# Patient Record
Sex: Male | Born: 1955 | Race: White | Hispanic: No | Marital: Single | Smoking: Current every day smoker
Health system: Southern US, Community
[De-identification: ages and names within clinical notes are randomized; demographics above are authoritative.]

## PROBLEM LIST (undated history)

## (undated) DIAGNOSIS — N529 Male erectile dysfunction, unspecified: Secondary | ICD-10-CM

## (undated) DIAGNOSIS — R911 Solitary pulmonary nodule: Secondary | ICD-10-CM

## (undated) DIAGNOSIS — N393 Stress incontinence (female) (male): Secondary | ICD-10-CM

## (undated) DIAGNOSIS — E349 Endocrine disorder, unspecified: Secondary | ICD-10-CM

## (undated) DIAGNOSIS — N401 Enlarged prostate with lower urinary tract symptoms: Secondary | ICD-10-CM

## (undated) DIAGNOSIS — C61 Malignant neoplasm of prostate: Secondary | ICD-10-CM

## (undated) DIAGNOSIS — Z89519 Acquired absence of unspecified leg below knee: Secondary | ICD-10-CM

## (undated) DIAGNOSIS — Z8639 Personal history of other endocrine, nutritional and metabolic disease: Secondary | ICD-10-CM

## (undated) DIAGNOSIS — E119 Type 2 diabetes mellitus without complications: Secondary | ICD-10-CM

## (undated) DIAGNOSIS — N138 Other obstructive and reflux uropathy: Secondary | ICD-10-CM

## (undated) DIAGNOSIS — R972 Elevated prostate specific antigen [PSA]: Secondary | ICD-10-CM

## (undated) DIAGNOSIS — Z8619 Personal history of other infectious and parasitic diseases: Secondary | ICD-10-CM

## (undated) HISTORY — DX: Malignant neoplasm of prostate: C61

## (undated) HISTORY — PX: HIP FRACTURE SURGERY: SHX118

## (undated) HISTORY — DX: Personal history of other endocrine, nutritional and metabolic disease: Z86.39

## (undated) HISTORY — DX: Male erectile dysfunction, unspecified: N52.9

## (undated) HISTORY — DX: Solitary pulmonary nodule: R91.1

## (undated) HISTORY — DX: Elevated prostate specific antigen (PSA): R97.20

## (undated) HISTORY — DX: Acquired absence of unspecified leg below knee: Z89.519

## (undated) HISTORY — PX: ROBOT ASSISTED LAPAROSCOPIC RADICAL PROSTATECTOMY: SHX5141

## (undated) HISTORY — DX: Other obstructive and reflux uropathy: N40.1

## (undated) HISTORY — DX: Type 2 diabetes mellitus without complications: E11.9

## (undated) HISTORY — DX: Endocrine disorder, unspecified: E34.9

## (undated) HISTORY — DX: Other obstructive and reflux uropathy: N13.8

## (undated) HISTORY — DX: Personal history of other infectious and parasitic diseases: Z86.19

## (undated) HISTORY — DX: Stress incontinence (female) (male): N39.3

## (undated) HISTORY — PX: LEG AMPUTATION BELOW KNEE: SHX694

---

## 2009-03-06 ENCOUNTER — Ambulatory Visit: Payer: Self-pay | Admitting: Internal Medicine

## 2009-03-08 ENCOUNTER — Ambulatory Visit: Payer: Self-pay | Admitting: Internal Medicine

## 2009-03-16 ENCOUNTER — Ambulatory Visit: Payer: Self-pay | Admitting: Internal Medicine

## 2009-03-23 ENCOUNTER — Ambulatory Visit: Payer: Self-pay | Admitting: Internal Medicine

## 2009-03-30 ENCOUNTER — Ambulatory Visit: Payer: Self-pay | Admitting: Internal Medicine

## 2009-04-06 ENCOUNTER — Ambulatory Visit: Payer: Self-pay | Admitting: Internal Medicine

## 2009-04-07 ENCOUNTER — Ambulatory Visit: Payer: Self-pay | Admitting: Internal Medicine

## 2009-04-13 ENCOUNTER — Ambulatory Visit: Payer: Self-pay | Admitting: Internal Medicine

## 2009-04-20 ENCOUNTER — Ambulatory Visit: Payer: Self-pay | Admitting: Internal Medicine

## 2009-04-27 ENCOUNTER — Ambulatory Visit: Payer: Self-pay | Admitting: Internal Medicine

## 2009-05-04 ENCOUNTER — Ambulatory Visit: Payer: Self-pay | Admitting: Internal Medicine

## 2009-05-11 ENCOUNTER — Ambulatory Visit: Payer: Self-pay | Admitting: Internal Medicine

## 2009-05-18 ENCOUNTER — Ambulatory Visit: Payer: Self-pay | Admitting: Internal Medicine

## 2009-06-01 ENCOUNTER — Ambulatory Visit: Payer: Self-pay | Admitting: Internal Medicine

## 2009-06-08 ENCOUNTER — Ambulatory Visit: Payer: Self-pay | Admitting: Internal Medicine

## 2009-06-15 ENCOUNTER — Ambulatory Visit: Payer: Self-pay | Admitting: Podiatrist

## 2009-07-07 ENCOUNTER — Ambulatory Visit: Payer: Self-pay | Admitting: Podiatrist

## 2010-10-03 ENCOUNTER — Encounter (HOSPITAL_COMMUNITY)
Admission: RE | Admit: 2010-10-03 | Discharge: 2010-10-03 | Disposition: A | Discharge status: Home / Self Care | Payer: BC Managed Care – PPO | Source: Ambulatory Visit | Attending: Orthopedic Surgery | Admitting: Orthopedic Surgery

## 2010-10-03 ENCOUNTER — Ambulatory Visit (HOSPITAL_COMMUNITY)
Admission: RE | Admit: 2010-10-03 | Discharge: 2010-10-03 | Disposition: A | Discharge status: Home / Self Care | Payer: BC Managed Care – PPO | Source: Ambulatory Visit | Attending: Orthopedic Surgery | Admitting: Orthopedic Surgery

## 2010-10-03 ENCOUNTER — Other Ambulatory Visit (HOSPITAL_COMMUNITY): Payer: Self-pay | Admitting: Orthopedic Surgery

## 2010-10-03 DIAGNOSIS — Z01818 Encounter for other preprocedural examination: Secondary | ICD-10-CM | POA: Insufficient documentation

## 2010-10-03 DIAGNOSIS — M869 Osteomyelitis, unspecified: Secondary | ICD-10-CM

## 2010-10-03 DIAGNOSIS — Z01812 Encounter for preprocedural laboratory examination: Secondary | ICD-10-CM | POA: Insufficient documentation

## 2010-10-03 LAB — PROTIME-INR
INR: 0.93 (ref 0.00–1.49)
Prothrombin Time: 12.7 seconds (ref 11.6–15.2)

## 2010-10-03 LAB — CBC
HCT: 42.2 % (ref 39.0–52.0)
Hemoglobin: 14.7 g/dL (ref 13.0–17.0)
RBC: 4.99 MIL/uL (ref 4.22–5.81)
WBC: 10.7 10*3/uL — ABNORMAL HIGH (ref 4.0–10.5)

## 2010-10-03 LAB — BASIC METABOLIC PANEL
Chloride: 97 mEq/L (ref 96–112)
GFR calc Af Amer: >60 mL/min (ref >60)
GFR calc non Af Amer: >60 mL/min (ref >60)
Potassium: 4.8 mEq/L (ref 3.5–5.1)
Sodium: 132 mEq/L — ABNORMAL LOW (ref 135–145)

## 2010-10-03 LAB — SURGICAL PCR SCREEN
MRSA, PCR: NEGATIVE
Staphylococcus aureus: NEGATIVE

## 2010-10-09 ENCOUNTER — Other Ambulatory Visit: Payer: Self-pay | Admitting: Orthopedic Surgery

## 2010-10-09 DIAGNOSIS — E1169 Type 2 diabetes mellitus with other specified complication: Principal | ICD-10-CM | POA: Diagnosis present

## 2010-10-09 DIAGNOSIS — I96 Gangrene, not elsewhere classified: Secondary | ICD-10-CM | POA: Diagnosis present

## 2010-10-09 DIAGNOSIS — M9080 Osteopathy in diseases classified elsewhere, unspecified site: Secondary | ICD-10-CM | POA: Diagnosis present

## 2010-10-09 DIAGNOSIS — E1159 Type 2 diabetes mellitus with other circulatory complications: Secondary | ICD-10-CM | POA: Diagnosis present

## 2010-10-09 DIAGNOSIS — M869 Osteomyelitis, unspecified: Secondary | ICD-10-CM | POA: Diagnosis present

## 2010-10-09 LAB — GLUCOSE, CAPILLARY
Glucose-Capillary: 214 mg/dL — ABNORMAL HIGH (ref 70–99)
Glucose-Capillary: 208 mg/dL — ABNORMAL HIGH (ref 70–99)
Glucose-Capillary: 153 mg/dL — ABNORMAL HIGH (ref 70–99)

## 2010-10-10 DIAGNOSIS — S88119A Complete traumatic amputation at level between knee and ankle, unspecified lower leg, initial encounter: Secondary | ICD-10-CM

## 2010-10-10 DIAGNOSIS — L98499 Non-pressure chronic ulcer of skin of other sites with unspecified severity: Secondary | ICD-10-CM

## 2010-10-10 DIAGNOSIS — I739 Peripheral vascular disease, unspecified: Secondary | ICD-10-CM

## 2010-10-10 LAB — CBC
HCT: 39.7 % (ref 39.0–52.0)
MCHC: 34.3 g/dL (ref 30.0–36.0)
MCV: 84.8 fL (ref 78.0–100.0)
RDW: 13.4 % (ref 11.5–15.5)

## 2010-10-10 LAB — BASIC METABOLIC PANEL
BUN: 8 mg/dL (ref 6–23)
Calcium: 9.2 mg/dL (ref 8.4–10.5)
Creatinine, Ser: 0.7 mg/dL (ref 0.4–1.5)
GFR calc non Af Amer: >60 mL/min (ref >60)
Glucose, Bld: 139 mg/dL — ABNORMAL HIGH (ref 70–99)

## 2010-10-10 LAB — GLUCOSE, CAPILLARY: Glucose-Capillary: 217 mg/dL — ABNORMAL HIGH (ref 70–99)

## 2010-10-10 NOTE — Op Note (Signed)
[name], [name]                 ACCOUNT NO.:  [id number]  MEDICAL RECORD NO.:  [id number]           PATIENT TYPE:  I  LOCATION:  5011                         FACILITY:  [facility]  PHYSICIAN:  Eulas Post, MD    DATE OF BIRTH:  [birth year]  DATE OF PROCEDURE:  10/09/2010 DATE OF DISCHARGE:                              OPERATIVE REPORT   PREOPERATIVE DIAGNOSIS:  Left foot diabetic osteomyelitis with gangrene.  POSTOPERATIVE DIAGNOSIS:  Left foot diabetic osteomyelitis with gangrene.  OPERATIVE PROCEDURE:  Left leg below-knee amputation.  ATTENDING SURGEON:  Eulas Post, MD  FIRST ASSISTANT:  Janace Litten, orthopedic PA-C  PREOPERATIVE INDICATIONS:  Allen Chavez is a 55 year old gentleman who had osteomyelitis of the foot.  He had had a transmetatarsal amputation and had ongoing chronic draining sinus wound.  He was frustrated with his failure to heal and wished to have a below-knee amputation.  We discussed the surgical options including amputation more proximally in the foot versus below-knee amputation and he was tired of dealing with multiple small operations and just wanted one that would reliably provide him with a leg that he could stand on with the prosthesis.  Therefore; the risks, benefits and alternatives were discussed before the procedure with the patient including, but not limited to the risks of recurrent infection, bleeding, nerve injury, neuroma formation, failure to regain ambulatory function, cardiopulmonary complications, blood clots, among others and he was willing to proceed.  OPERATIVE PROCEDURE:  The patient brought to the operating room, placed in supine position.  Vancomycin was given due to his chronic infection, although I did not have a diagnosis of MRSA, but he is at risk given his health concerns and risk factors including his diabetes.  Therefore, after general anesthesia was administered, the left lower extremity was prepped and  draped in the usual sterile fashion.  The leg was elevated and a tourniquet was used, but the leg was exsanguinated beginning above the level of the infection.  Tourniquet was then inflated.  Total tourniquet time was 60 minutes.  I then marked out my bony resection which was approximately 13-14 cm below the joint line.  I also made a fishmouth-type skin flap with it being actually more two-thirds length in the back and one-third in the front.  I then incised the skin and identified the saphenous nerve and vein.  The vein was tied off and the nerve was resected proximally as high as possible with a sharp knife.  I then found the superficial and the deep peroneal nerve and resected them proximal as high as possible.  The peroneal arteries were also ligated.  I then resected my tibia using a saw and beveled it at 45 degrees anteriorly.  I also resected the fibula prior to resecting the tibia and I did this about 1.5 cm proximal to the tibial cut.  I then developed a flap for the soleus and the gastroc and identified the tibial artery and posterior tibial nerve.  These were then ligated and I cut the tibial nerve as proximal as possible with a sharp blade. Once all of this  had been achieved, I trimmed my gastroc flap up to the appropriate size and then released the tourniquet.  Excellent hemostasis was obtained.  I then repaired the fascia of the soleus flap to the periosteum anteriorly over the tibia using #1 Vicryl.  I then repaired the subcutaneous tissue with 2-0 Vicryl followed by staples for the skin.  There was no bleeding of any significance, and therefore I did not place the drain.  Sterile dressing followed by a splint was applied. The patient was awakened and returned to the PACU in stable and satisfactory condition.  There were no complications and he tolerated the procedure well.  Janace Litten, orthopedic PA was present and scrubbed throughout the case and critical for  retraction as well as exposure and assistance with closure.     Eulas Post, MD     JPL/MEDQ  D:  10/09/2010  T:  10/10/2010  Job:  [id number]  Electronically Signed by Teryl Lucy MD on 10/10/2010 09:48:01 AM

## 2010-10-11 LAB — GLUCOSE, CAPILLARY

## 2010-10-15 NOTE — Discharge Summary (Signed)
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  PRIMARY PROCEDURE:  Left below-knee amputation.  DISCHARGE MEDICATIONS:  Percocet as well as Valium and Phenergan as needed.  HOSPITAL COURSE:  Mr. Llewyn Buonocore is a 55 year old gentleman with diabetes who has had multiple partial foot amputations and continued to have draining osteomyelitis.  He elected to undergo below-knee amputation.  He tolerated the procedure well.  Postoperatively, he did not have any complications.  He was given perioperative vancomycin in order to reduce the risk for MRSA infection.  He did screen MRSA negative; however, given his history of osteomyelitis and multiple surgeries, I was concerned that he had a higher than normal risk for MRSA and therefore used vancomycin.  His dressings were clean, dry, and intact throughout his hospital stay.  He benefited maximally from this hospital stay and is currently being discharged home with follow up with me in 2 weeks.  His pain was initially controlled on IV PCA and then subsequently switched to p.o. analgesia.  He was also given Lovenox and sequential compression devices for DVT prophylaxis.     Eulas Post, MD     JPL/MEDQ  D:  10/10/2010  T:  10/10/2010  Job:  [id number]  Electronically Signed by Teryl Lucy MD on 10/15/2010 07:03:42 PM

## 2011-02-06 ENCOUNTER — Encounter: Payer: Self-pay | Admitting: Orthopedic Surgery

## 2011-02-27 ENCOUNTER — Encounter: Payer: Self-pay | Admitting: Orthopedic Surgery

## 2013-11-22 ENCOUNTER — Emergency Department: Payer: Self-pay | Admitting: Emergency Medicine

## 2013-11-22 LAB — URINALYSIS, COMPLETE
BACTERIA: NONE SEEN
BILIRUBIN, UR: NEGATIVE
Glucose,UR: >=500 mg/dL (ref 0–75)
Ketone: NEGATIVE
LEUKOCYTE ESTERASE: NEGATIVE
NITRITE: NEGATIVE
PH: 5 (ref 4.5–8.0)
SQUAMOUS EPITHELIAL: NONE SEEN
Specific Gravity: 1.02 (ref 1.003–1.030)
WBC UR: 1 /HPF (ref 0–5)

## 2013-11-22 LAB — CBC
HCT: 47.1 % (ref 40.0–52.0)
HGB: 15.9 g/dL (ref 13.0–18.0)
MCH: 28.3 pg (ref 26.0–34.0)
MCHC: 33.7 g/dL (ref 32.0–36.0)
MCV: 84 fL (ref 80–100)
Platelet: 222 10*3/uL (ref 150–440)
RBC: 5.61 10*6/uL (ref 4.40–5.90)
RDW: 13.6 % (ref 11.5–14.5)
WBC: 10.7 10*3/uL — ABNORMAL HIGH (ref 3.8–10.6)

## 2013-11-22 LAB — COMPREHENSIVE METABOLIC PANEL
AST: 18 U/L (ref 15–37)
Albumin: 3.8 g/dL (ref 3.4–5.0)
Alkaline Phosphatase: 151 U/L — ABNORMAL HIGH
Anion Gap: 7 (ref 7–16)
BUN: 24 mg/dL — ABNORMAL HIGH (ref 7–18)
Bilirubin,Total: 0.6 mg/dL (ref 0.2–1.0)
CALCIUM: 9.9 mg/dL (ref 8.5–10.1)
CHLORIDE: 101 mmol/L (ref 98–107)
Co2: 26 mmol/L (ref 21–32)
Creatinine: 1.14 mg/dL (ref 0.60–1.30)
EGFR (African American): >60
Glucose: 229 mg/dL — ABNORMAL HIGH (ref 65–99)
Osmolality: 280 (ref 275–301)
POTASSIUM: 4.5 mmol/L (ref 3.5–5.1)
SGPT (ALT): 23 U/L (ref 12–78)
Sodium: 134 mmol/L — ABNORMAL LOW (ref 136–145)
TOTAL PROTEIN: 8 g/dL (ref 6.4–8.2)

## 2013-11-22 LAB — LIPASE, BLOOD: LIPASE: 597 U/L — AB (ref 73–393)

## 2014-08-01 DIAGNOSIS — I10 Essential (primary) hypertension: Secondary | ICD-10-CM | POA: Insufficient documentation

## 2014-08-01 DIAGNOSIS — E119 Type 2 diabetes mellitus without complications: Secondary | ICD-10-CM | POA: Insufficient documentation

## 2014-08-01 DIAGNOSIS — C61 Malignant neoplasm of prostate: Secondary | ICD-10-CM | POA: Insufficient documentation

## 2014-09-26 ENCOUNTER — Ambulatory Visit: Payer: Self-pay | Admitting: Urology

## 2014-11-03 ENCOUNTER — Encounter
Admit: 2014-11-03 | Disposition: A | Discharge status: Home / Self Care | Payer: Self-pay | Attending: Urology | Admitting: Urology

## 2014-11-07 ENCOUNTER — Ambulatory Visit
Admit: 2014-11-07 | Disposition: A | Discharge status: Home / Self Care | Payer: Self-pay | Attending: Urology | Admitting: Urology

## 2014-11-07 LAB — BASIC METABOLIC PANEL
Anion Gap: 8 (ref 7–16)
BUN: 21 mg/dL — ABNORMAL HIGH
CALCIUM: 9.3 mg/dL
CHLORIDE: 103 mmol/L
CO2: 27 mmol/L
CREATININE: 1.04 mg/dL
GLUCOSE: 335 mg/dL — AB
POTASSIUM: 4.4 mmol/L
SODIUM: 138 mmol/L

## 2014-11-07 LAB — CBC
HCT: 41.6 % (ref 40.0–52.0)
HGB: 14 g/dL (ref 13.0–18.0)
MCH: 28.8 pg (ref 26.0–34.0)
MCHC: 33.6 g/dL (ref 32.0–36.0)
MCV: 86 fL (ref 80–100)
PLATELETS: 186 10*3/uL (ref 150–440)
RBC: 4.85 10*6/uL (ref 4.40–5.90)
RDW: 13.8 % (ref 11.5–14.5)
WBC: 7.7 10*3/uL (ref 3.8–10.6)

## 2014-11-07 LAB — APTT: ACTIVATED PTT: 24.3 s (ref 23.6–35.9)

## 2014-11-07 LAB — HEMOGLOBIN A1C: HEMOGLOBIN A1C: 10.1 % — AB

## 2014-11-07 LAB — PROTIME-INR
INR: 0.9
Prothrombin Time: 11.9 secs

## 2014-11-08 ENCOUNTER — Ambulatory Visit
Admit: 2014-11-08 | Disposition: A | Discharge status: Home / Self Care | Payer: Self-pay | Attending: Urology | Admitting: Urology

## 2014-11-14 ENCOUNTER — Inpatient Hospital Stay
Admit: 2014-11-14 | Disposition: A | Discharge status: Home / Self Care | Payer: Self-pay | Attending: Urology | Admitting: Urology

## 2014-11-15 LAB — BASIC METABOLIC PANEL
ANION GAP: 1 — AB (ref 7–16)
BUN: 26 mg/dL — ABNORMAL HIGH
CALCIUM: 8 mg/dL — AB
Chloride: 108 mmol/L
Co2: 26 mmol/L
Creatinine: 0.91 mg/dL
EGFR (African American): >60
GLUCOSE: 167 mg/dL — AB
Potassium: 4.1 mmol/L
Sodium: 135 mmol/L

## 2014-11-15 LAB — CBC WITH DIFFERENTIAL/PLATELET
BASOS PCT: 0.6 %
Basophil #: 0.1 10*3/uL (ref 0.0–0.1)
EOS PCT: 0.2 %
Eosinophil #: 0 10*3/uL (ref 0.0–0.7)
HCT: 34.5 % — AB (ref 40.0–52.0)
HGB: 11.5 g/dL — ABNORMAL LOW (ref 13.0–18.0)
LYMPHS ABS: 1.4 10*3/uL (ref 1.0–3.6)
Lymphocyte %: 12 %
MCH: 28.9 pg (ref 26.0–34.0)
MCHC: 33.3 g/dL (ref 32.0–36.0)
MCV: 87 fL (ref 80–100)
MONO ABS: 0.6 x10 3/mm (ref 0.2–1.0)
MONOS PCT: 5.4 %
NEUTROS ABS: 9.8 10*3/uL — AB (ref 1.4–6.5)
Neutrophil %: 81.8 %
Platelet: 173 10*3/uL (ref 150–440)
RBC: 3.98 10*6/uL — AB (ref 4.40–5.90)
RDW: 14 % (ref 11.5–14.5)
WBC: 11.9 10*3/uL — AB (ref 3.8–10.6)

## 2014-11-21 LAB — SURGICAL PATHOLOGY

## 2014-11-27 NOTE — Discharge Summary (Signed)
Dates of Admission and Diagnosis:  Date of Admission 14-Nov-2014   Date of Discharge 15-Nov-2014   Admitting Diagnosis Prostate cancer   Final Diagnosis prostate cancer   Discharge Diagnosis 1 prostate cancer   2 diabetes    Chief Complaint/History of Present Illness See admission H&P   Allergies:  No Known Allergies:   Routine Chem:  19-Apr-16 05:08   Glucose, Serum  167 (65-99 NOTE: New Reference Range  10/04/14)  BUN  26 (6-20 NOTE: New Reference Range  10/04/14)  Creatinine (comp) 0.91 (0.61-1.24 NOTE: New Reference Range  10/04/14)  Sodium, Serum 135 (135-145 NOTE: New Reference Range  10/04/14)  Potassium, Serum 4.1 (3.5-5.1 NOTE: New Reference Range  10/04/14)  Chloride, Serum 108 (101-111 NOTE: New Reference Range  10/04/14)  CO2, Serum 26 (22-32 NOTE: New Reference Range  10/04/14)  Calcium (Total), Serum  8.0 (8.9-10.3 NOTE: New Reference Range  10/04/14)  Anion Gap  1  eGFR (African American) >60  eGFR (Non-African American) >60 (eGFR values <74m/min/1.73 m2 may be an indication of chronic kidney disease (CKD). Calculated eGFR is useful in patients with stable renal function. The eGFR calculation will not be reliable in acutely ill patients when serum creatinine is changing rapidly. It is not useful in patients on dialysis. The eGFR calculation may not be applicable to patients at the low and high extremes of body sizes, pregnant women, and vegetarians.)  Routine Hem:  19-Apr-16 05:08   WBC (CBC)  11.9  RBC (CBC)  3.98  Hemoglobin (CBC)  11.5  Hematocrit (CBC)  34.5  Platelet Count (CBC) 173  MCV 87  MCH 28.9  MCHC 33.3  RDW 14.0  Neutrophil % 81.8  Lymphocyte % 12.0  Monocyte % 5.4  Eosinophil % 0.2  Basophil % 0.6  Neutrophil #  9.8  Lymphocyte # 1.4  Monocyte # 0.6  Eosinophil # 0.0  Basophil # 0.1 (Result(s) reported on 15 Nov 2014 at 05:58AM.)   Pertinent Past History:  Pertinent Past History see admission H&P    Hospital Course:  Hospital Course Patient was admitted on overnight followed uncomplicated robotic prostatectomy.  His diet was advanced and pain was controlled.  On POD1, he was doing well, ambulating, tolerating PO, and pain was well controlled.  All labs and vitals were stable.  He was discharged home with Foley catheter in place x 1 week with Foley catheter teaching.   Condition on Discharge Good   Code Status:  Code Status Full Code   DISCHARGE INSTRUCTIONS HOME MEDS:  Medication Reconciliation: Patient's Home Medications at Discharge:     Medication Instructions  prilosec otc 20 mg oral delayed release tablet  1 tab(s) orally once a day   atorvastatin 40 mg oral tablet  1 tab(s) orally once a day   lisinopril 20 mg oral tablet  1 tab(s) orally once a day   lantus solostar pen 100 units/ml subcutaneous solution  40 unit(s) subcutaneous once a day (in the morning)   acetaminophen-hydrocodone 325 mg-5 mg oral tablet  1 tab(s) orally every 6 hours, As Needed - for Pain   colace sodium 100 mg oral capsule  1 cap(s) orally 2 times a day   oxybutynin 5 mg oral tablet  1 tab(s) orally 3 times a day, As Needed - for Bladder Spasms    PRESCRIPTIONS: PRINTED AND PLACED ON CHART  STOP TAKING THE FOLLOWING MEDICATION(S):    finasteride 5 mg oral tablet: 1 tab(s) orally once a day  Physician's Instructions:  Home  Health? No   Treatments Foley to leg bag   Dressing Care You may shower, no baths.   Home Oxygen? No   Diet Diabetic diet   Dietary Supplements None   Diet Consistency Regular Consistency   Activity Limitations No exertional activity  No heavy lifting  4-6 weeks   Return to Work after follow up visit with MD   Time frame for Follow Up Appointment 1-2 weeks  7 days for Foley removal     Hollice Espy J(Attending Physician): [facility], [address] [place], [place], [place] [address], [place] [phone]  TIME SPENT:  Total Time: 30 minutes or  less   Electronic Signatures: Sherlynn Stalls (MD)  (Signed 19-Apr-16 10:18)  Authored: ADMISSION DATE AND DIAGNOSIS, CHIEF COMPLAINT/HPI, Allergies, PERTINENT LABS, PERTINENT PAST HISTORY, HOSPITAL COURSE, DISCHARGE INSTRUCTIONS HOME MEDS, PATIENT INSTRUCTIONS, Follow Up Physician, TIME SPENT   Last Updated: 19-Apr-16 10:18 by Sherlynn Stalls (MD)

## 2014-11-27 NOTE — Op Note (Signed)
PATIENT NAME:  Allen Chavez, Allen Chavez MR#:  [id number] DATE OF BIRTH:  Dec 08, 1955  DATE OF PROCEDURE:  11/14/2014  PREOPERATIVE DIAGNOSIS: Prostate cancer.   POSTOPERATIVE DIAGNOSIS: Prostate cancer.   PROCEDURE PERFORMED: Robot-assisted laparoscopic radical nephrectomy (non-nerve sparing) with bilateral pelvic lymph node dissection.   ATTENDING SURGEON: [name], MD    ASSISTANT:  Lewis Moccasin, PA-C.    ANESTHESIA: General anesthesia.   ESTIMATED BLOOD LOSS: 200 mL.   DRAINS:  82 French Foley catheter.   SPECIMENS: Periprosthetic fat, prostate with seminal vesicles and vasa, left pelvic lymph nodes, right pelvic lymph nodes.   COMPLICATIONS: None.   INDICATION: This is a 59 year old male who was previously diagnosed with Gleason 3 + 3 prostate cancer on active surveillance, repeat prostate biopsy showed more aggressive disease up to 4 + 4 involving 6 of 6 cores on the left and 3 of 6 cores on the right. He did have staging workup including CT and bone scan, both of which were negative for any obvious metastatic disease or extracapsular disease. He was counseled on his various treatment options and has elected to undergo robot-assisted laparoscopic radical prostatectomy with bilateral pelvic lymph node dissection given his high-grade cancer as well as severe baseline erectile dysfunction in the setting of poorly controlled diabetes. He was counseled to undergo a non-nerve sparing procedure, to which the patient is agreeable.  Risks and benefits of the procedure were explained in detail to the patient, who agreed to proceed as planned.   PROCEDURE: The patient was correctly identified in the preoperative holding area and informed consent was confirmed. He was brought to the operating suite and placed on the table in supine position. At this time universal timeout protocol was performed. All team members were identified. Venodyne boots were placed and he was administered 2 grams of IV Ancef in the  perioperative period. He was then placed under general anesthesia, shaved by the attending surgeon, and placed in the lateral decubitus position with the arms tucked at the side. Care was taken to pad all pressure points. He was then secured to the operating room table using straps, foam, and 2 inch tape in a cruciate fashion across the chest. He was then prepped and draped in the standard surgical fashion. At this point in time a Veress needle was advanced supraumbilically and pneumo-insufflation was established after the Veress needle was aspirated and a saline drop test was performed.  There were low opening pressures to 5 mmHg.  He was then insufflated to 15 mmHg, which was well tolerated. The remainder of the port sites were then mapped out across his lower abdominal wall. He was placed in steep Trendelenburg position. A 12 mm non-bladed trocar was placed through supraumbilical incision without difficulty. The robotic camera was brought in and there were no injuries to bowel or any vasculature injury noted.  Each of the remaining ports were then placed under direct visualization including 2 robotic port sites 8 mm in diameter in the left lower quadrant, a robotic 8 mm port in the right lower quadrant, as well as a 12 mm in the lateral right lower quadrant, a 5 mm port was placed in the right upper quadrant, all under direct visualization. Each of the port sites were instilled with a total of 20 mL of local anesthetic prior to placement.  At this point in time the robot was then brought in and docked. The left sigmoid was fairly adherent to the abdominal wall and there were several adhesions which were  taken down sharply to free up the colon with care taken to avoid injury to the bowel. Once this was performed the bladder was taken down by incising the median umbilical ligaments and creating and plane in the space of Retzius until the pubic bones could clearly be identified. The prostate was then identified and  the fat overlying the prostate was dissected free and sent off as a periprosthetic fat.  The endopelvic fascia was then identified and starting on the right hand side was carefully opened, creating a plane between the pelvic musculature and the prostate. Dissection was carried out towards the dorsal venous complex.  A pudendal artery was identified on the right-hand side and care was taken to preserve this vascular structure. The left endopelvic fascia was then opened in a similar fashion towards the dorsal venous complex.  The complex was skeletonized. The puboprostatic ligaments were taken down using electrocautery. Once this was achieved dorsal venous complex was ligated using a 2-0 Vicryl suture. Next the Foley catheter was manipulated such that the plane between the prostate and the bladder could easily be identified. An incision was then created at this site and carried down until the Foley catheter was identifiable.  The Foley was lifted out of the wound into the abdominal cavity and a Carter-Thomason needle was used just above the pubic symphysis to place a 0 Vicryl suture which was threaded through the eye of this Foley catheter and brought back out to the level of the skin. This was then clamped in place and the penis was accordioned to create some upward tension, anterior retraction on the prostate.  A posterior plane between the bladder neck and base of the prostate was then created. This plane was developed until the seminal vesicle structures were identified posteriorly. The pedicles were left in place. Each of the vas deferens were then identified and ultimately transected and retracted superiorly. The seminal vesicles were then freely dissected. Care was taken to use judicious cautery at the tips of the seminal vesicles even though this was to be a non-nerve sparing procedure. Once all the seminal vesicles and vas deferens were dissected free they were retracted superiorly and Denonvilliers fascia  was incised. A plane was created between the rectum and the posterior of the prostate. Next attention was turned to ligation of the pedicles starting on the left-hand side. The pedicles were taken in a piecewise fashion using Weck clips. The dissection was then carried out posterolaterally towards the apex of the prostate. The same procedure was used on the right side, ligating the pedicle with a series of Weck clips. At this point in time once the dissection was carried out towards the apex the prostate was only attached to this point by the urethra and the apex. The dorsal venous complex was ligated using electrocautery. The previously placed stitch was sufficient for adequate hemostasis at this point.  The urethra was carefully dissected free and care was taken using sharp dissection to cut the urethra, optimizing the urethral length, but insuring no positive margin at the apex. Finally the periurethral fascia posteriorly was incised, freeing up the entire prostate specimen. The prostate was placed into the Endo Catch bag and placed in a dependent area within the abdominal cavity for later retrieval. The pelvis was then irrigated, there was no active bleeding noted. The pelvis was then filled and air was injected through a Malecot rectal catheter which had been placed posteriorly at the beginning of the case. Of note at the beginning  of the case a 41 French Foley catheter was also placed sterilely of the field as well. Attention was then turned to the right pelvic sidewall where a pelvic lymph node dissection was then performed. The fatty nodal tissue overlying the external iliac vein and artery was dissected free from these major vessels and then more distally towards the obturator artery, nerve, and vein. Care was taken to avoid any injury to these structures. Metal clips were used for hemostasis as well as judicious use of the bipolar electrocautery. The nodal tissue was cleared out from the obturator fossa.  Once this was freed up it was passed off the field as right pelvic lymph nodes. The same exact procedure was performed on the left side and the left pelvic lymph nodes were passed off separately from the field. Again there was no active bleeding noted.  Finally attention was then turned to the completion of the anastomosis.  A Rocco stitch using a 3-0 barbed Covidien suture was used to reapproximate Denonvilliers fascia with the periurethral fascia bringing the bladder neck and urethra closer together and tension-free. Next a running 2 armed, 3-0 Covidien barbed suture was used in a running fashion to create a watertight anastomosis.  The transition stitch was performed at the anterior aspect of the urethra near the end to end anastomosis and finally anastomosis was tied down. The stitch was then placed very loosely through some tissue just below the pubic symphysis to create a suspension stitch. The neeles were then passed off the field. The bladder was then filled with 120 mL of sterile water. The bladder filled nicely and there was no leakage noted from the anastomotic site. The final Foley 18 French catheter was placed with 10 mL in the balloon.  At this point in time all robotic instruments were removed. The trocars were then also removed under direct visualization. The midline trocar site was extended laterally on both sides to open the fascia.  The specimen was then brought up through this incision.  The incision was then closed using a running number 1 PDS. All skin wounds were then closed using a 4-0 Monocryl suture in a subcuticular fashion. The patient was cleaned and dried and a dressing of Dermabond was applied. The patient was then taken out of Trendelenburg, repositioned in a supine position, reversed from anesthesia, and taken to PACU in stable condition. Catheter was secured to the patient's left leg prior to moving the patient. All sponge and needle counts were correct at the end of the case. There  were no complications in this case.     ____________________________ Claris Gladden, MD ajb:[id number] D: 11/14/2014 17:22:19 ET T: 11/14/2014 20:49:29 ET JOB#: [id number]  cc: Claris Gladden, MD, <Dictator> Claris Gladden MD ELECTRONICALLY SIGNED 11/17/2014 14:46

## 2014-11-27 NOTE — Op Note (Addendum)
PATIENT NAME:  Allen Chavez, Allen Chavez MR#:  [id number] DATE OF BIRTH:  1956-02-15  DATE OF PROCEDURE:  11/14/2014  PREOPERATIVE DIAGNOSIS: Prostate cancer.   POSTOPERATIVE DIAGNOSIS: Prostate cancer.   PROCEDURE PERFORMED: Robot-assisted laparoscopic radical prostatectomy (non-nerve sparing) with bilateral pelvic lymph node dissection.   ATTENDING SURGEON: [name], MD    ASSISTANT:  Lewis Moccasin, PA-C.    ANESTHESIA: General anesthesia.   ESTIMATED BLOOD LOSS: 200 mL.   DRAINS:  13 French Foley catheter.   SPECIMENS: Periprosthetic fat, prostate with seminal vesicles and vasa left pelvic lymph nodes, right pelvic lymph nodes.   COMPLICATIONS: None.   INDICATION: This is a 59 year old male who was previously diagnosed with Gleason 3 + 3 prostate cancer on active surveillance, repeat prostate biopsy showed more aggressive disease up to 4 + 4 involving 6 of 6 cores on the left and 3 of 6 cores on the right. He did have staging workup including CT and bone scan, both of which were negative for any obvious metastatic disease or extracapsular disease. He was counseled on his various treatment options and has elected to undergo robot-assisted laparoscopic radical prostatectomy with bilateral pelvic lymph node dissection given his high-grade cancer as well as severe baseline erectile dysfunction in the setting of poorly controlled diabetes. He was counseled to undergo a non-nerve sparing procedure, to which the patient is agreeable.  Risks and benefits of the procedure were explained in detail to the patient, who agreed to proceed as planned.   PROCEDURE: The patient was correctly identified in the preoperative holding area and informed consent was confirmed. He was brought to the operating suite and placed on the table in supine position. At this time universal timeout protocol was performed. All team members were identified. Venodyne boots were placed and he was administered 2 grams of IV Ancef in the  perioperative period. He was then placed under general anesthesia, shaved by the attending surgeon, and placed in the lateral decubitus position with the arms tucked at the side. Care was taken to pad all pressure points. He was then secured to the operating room table using straps, foam, and 2 inch tape in a cruciate fashion across the chest. He was then prepped and draped in the standard surgical fashion. At this point in time a Veress needle was advanced supraumbilically and pneumo-insufflation was established after the Veress needle was aspirated and a saline drop test was performed.  There were low opening pressures to 5 mmHg.  He was then insufflated to 15 mmHg, which was well tolerated. The remainder of the port sites were then mapped out across his lower abdominal wall. He was placed in steep Trendelenburg position. A 12 mm non-bladed trocar was placed through supraumbilical incision without difficulty. The robotic camera was brought in and there were no injuries to bowel or any vasculature injury noted.  Each of the remaining ports were then placed under direct visualization including 2 robotic port sites 8 mm in diameter in the left lower quadrant, a robotic 8 mm port in the right lower quadrant, as well as a 12 mm in the lateral right lower quadrant, a 5 mm port was placed in the right upper quadrant, all under direct visualization. Each of the port sites were instilled with a total of 20 mL of local anesthetic prior to placement.  At this point in time the robot was then brought in and docked. The left sigmoid was fairly adherent to the abdominal wall and there were several adhesions which were  taken down sharply to free up the colon with care taken to avoid injury to the bowel. Once this was performed the bladder was taken down by incising the median umbilical ligaments and creating and plane in the space of Retzius until the pubic bones could clearly be identified. The prostate was then identified and  the fat overlying the prostate was dissected free and sent off as a periprosthetic fat.  The endopelvic fascia was then identified and starting on the right hand side was carefully opened, creating a plane between the pelvic musculature and the prostate. Dissection was carried out towards the dorsal venous complex.  A pudendal artery was identified on the right-hand side and care was taken to preserve this vascular structure. The left endopelvic fascia was then opened in a similar fashion towards the dorsal venous complex.  The complex was skeletonized. The puboprostatic ligaments were taken down using electrocautery. Once this was achieved dorsal venous complex was ligated using a 2-0 Vicryl suture. Next the Foley catheter was manipulated such that the plane between the prostate and the bladder could easily be identified. An incision was then created at this site and carried down until the Foley catheter was identifiable.  The Foley was lifted out of the wound into the abdominal cavity and a Carter-Thomason needle was used just above the pubic symphysis to place a 0 Vicryl suture which was threaded through the eye of this Foley catheter and brought back out to the level of the skin. This was then clamped in place and the penis was accordioned to create some upward tension, anterior retraction on the prostate.  A posterior plane between the bladder neck and base of the prostate was then created. This plane was developed until the seminal vesicle structures were identified posteriorly. The pedicles were left in place. Each of the vas deferens were then identified and ultimately transected and retracted superiorly. The seminal vesicles were then freely dissected. Care was taken to use judicious cautery at the tips of the seminal vesicles even though this was to be a non-nerve sparing procedure. Once all the seminal vesicles and vas deferens were dissected free they were retracted superiorly and Denonvilliers fascia  was incised. A plane was created between the rectum and the posterior of the prostate. Next attention was turned to ligation of the pedicles starting on the left-hand side. The pedicles were taken in a piecewise fashion using Weck clips. The dissection was then carried out posterolaterally towards the apex of the prostate. The same procedure was used on the right side, ligating the pedicle with a series of Weck clips. At this point in time once the dissection was carried out towards the apex the prostate was only attached to this point by the urethra and the apex. The dorsal venous complex was ligated using electrocautery. The previously placed stitch was sufficient for adequate hemostasis at this point.  The urethra was carefully dissected free and care was taken using sharp dissection to cut the urethra, optimizing the urethral length, but insuring no positive margin at the apex. Finally the periurethral fascia posteriorly was incised, freeing up the entire prostate specimen. The prostate was placed into the Endo Catch bag and placed in a dependent area within the abdominal cavity for later retrieval. The pelvis was then irrigated, there was no active bleeding noted. The pelvis was then filled and air was injected through a Malecot rectal catheter which had been placed posteriorly at the beginning of the case. Of note at the beginning  of the case a 46 French Foley catheter was also placed sterilely of the field as well. Attention was then turned to the right pelvic sidewall where a pelvic lymph node dissection was then performed. The fatty nodal tissue overlying the external iliac vein and artery was dissected free from these major vessels and then more distally towards the obturator artery, nerve, and vein. Care was taken to avoid any injury to these structures. Metal clips were used for hemostasis as well as judicious use of the bipolar electrocautery. The nodal tissue was cleared out from the obturator fossa.  Once this was freed up it was passed off the field as right pelvic lymph nodes. The same exact procedure was performed on the left side and the left pelvic lymph nodes were passed off separately from the field. Again there was no active bleeding noted.  Finally attention was then turned to the completion of the anastomosis.  A Rocco stitch using a 3-0 barbed Covidien suture was used to reapproximate Denonvilliers fascia with the periurethral fascia bringing the bladder neck and urethra closer together and tension-free. Next a running 2 armed, 3-0 Covidien barbed suture was used in a running fashion to create a watertight anastomosis.  The transition stitch was performed at the anterior aspect of the urethra near the end to end anastomosis and finally anastomosis was tied down. The stitch was then placed very loosely through some tissue just below the pubic symphysis to create a suspension stitch. The needles were then passed off the field. The bladder was then filled with 120 mL of sterile water. The bladder filled nicely and there was no leakage noted from the anastomotic site. The final Foley 18 French catheter was placed with 10 mL in the balloon.  At this point in time all robotic instruments were removed. The trocars were then also removed under direct visualization. The midline trocar site was extended laterally on both sides to open the fascia.  The specimen was then brought up through this incision.  The incision was then closed using a running number 1 PDS. All skin wounds were then closed using a 4-0 Monocryl suture in a subcuticular fashion. The patient was cleaned and dried and a dressing of Dermabond was applied. The patient was then taken out of Trendelenburg, repositioned in a supine position, reversed from anesthesia, and taken to PACU in stable condition. Catheter was secured to the patient's left leg prior to moving the patient. All sponge and needle counts were correct at the end of the case.  There were no complications in this case.     ____________________________ Claris Gladden, MD ajb:[id number] D: 11/14/2014 17:22:19 ET T: 11/14/2014 20:49:29 ET JOB#: [id number]  cc: Claris Gladden, MD, <Dictator> Claris Gladden MD ELECTRONICALLY SIGNED 11/25/2014 20:50

## 2015-02-01 ENCOUNTER — Telehealth: Payer: Self-pay | Admitting: Urology

## 2015-02-01 NOTE — Telephone Encounter (Signed)
Received a fax from [facility] in the PT Department regarding the referral to PT for this patient.  It states "Contacted the patient and he/she is not interested in PT services at this time."

## 2015-03-10 ENCOUNTER — Ambulatory Visit (INDEPENDENT_AMBULATORY_CARE_PROVIDER_SITE_OTHER): Payer: BC Managed Care – PPO | Admitting: Urology

## 2015-03-10 ENCOUNTER — Encounter: Payer: Self-pay | Admitting: Urology

## 2015-03-10 VITALS — BP 99/63 | HR 71 | Ht 74.0 in | Wt 180.5 lb

## 2015-03-10 DIAGNOSIS — R911 Solitary pulmonary nodule: Secondary | ICD-10-CM

## 2015-03-10 DIAGNOSIS — N5231 Erectile dysfunction following radical prostatectomy: Secondary | ICD-10-CM

## 2015-03-10 DIAGNOSIS — C61 Malignant neoplasm of prostate: Secondary | ICD-10-CM

## 2015-03-10 DIAGNOSIS — N393 Stress incontinence (female) (male): Secondary | ICD-10-CM

## 2015-03-10 LAB — MICROSCOPIC EXAMINATION
BACTERIA UA: NONE SEEN
Epithelial Cells (non renal): NONE SEEN /hpf (ref 0–10)
RBC MICROSCOPIC, UA: NONE SEEN /HPF (ref 0–?)

## 2015-03-10 LAB — URINALYSIS, COMPLETE
Glucose, UA: NEGATIVE
Leukocytes, UA: NEGATIVE
Nitrite, UA: NEGATIVE
RBC, UA: NEGATIVE
Specific Gravity, UA: >1.03 — ABNORMAL HIGH (ref 1.005–1.030)
Urobilinogen, Ur: 1 mg/dL (ref 0.2–1.0)
pH, UA: 5.5 (ref 5.0–7.5)

## 2015-03-10 NOTE — Progress Notes (Signed)
03/10/2015 10:10 AM   Allen Chavez 12-13-1955 [id number]  Referring provider: No referring provider defined for this encounter.  Chief Complaint  Patient presents with  . Prostate Cancer     s/p robotic radical prostecomy (nonnerve sparing) with BPLND in 10/2014,    HPI: 59 yo M with prostate cancer s/p robotic radical prostatectomy (non-nerve sparing) with bilateral lymph node dissection on 11/14/2014.  Surgical pathology consistent with Gleason 5+4 involving up to 20% of the prostate, bilaterally. Margins negative. Evidence of extraacapsular extension. No seminal vesicle involvement. Lymph nodes were negative bilaterally. pT2cN0M0.   His PSA at 4 weeks post op was undetectable.    Leakage since surgery improving, 2-3 ppd improved from 4 ppd immediately post op.  He is not too bothered by this.  He is no longer doing PT.  Preop pathology Gleason 4+4, 4+3, and 3+3 involving total of 9/12 cores, 6/6 on left (high grade up to 85% tissue) and low grade 3+3 in 3/6 cores on right. TRUS volume 35 cc. iPSA on finasteride.  He does have baseline severe ED (diabetic). He is not sexually active currently and is not able to maintain or achieve erections. He is on Cialis 5 mg daily.   He underwent staging with CT abd/ pelvis and bone scan on 09/26/14 which was negative for metastatic disease. He did have an incidental 3 mm right lower lobe lung nodule but otherwise studies fairly unremarkable.   CT Chest 10/2014 IMPRESSION: 1. Stable 3 mm right lower lobe nodule. There is also an average size 3 mm right middle lobe nodule. Given the history prostate cancer, standard follow up recommendations do not apply. It may be prudent to follow these at 3-9 months particularly if there is concern for metastatic prostate cancer. 2. Atherosclerosis. 3. Ascending thoracic aortic aneurysm at 4 cm. Recommend annual imaging followup by CTA or MRA.    PMH: History reviewed. No pertinent  past medical history.  Surgical History: History reviewed. No pertinent past surgical history.  Home Medications:    Medication List       This list is accurate as of: 03/10/15 10:10 AM.  Always use your most recent med list.               ADVOCATE INSULIN PEN NEEDLES 31G X 8 MM Misc  Generic drug:  Insulin Pen Needle     B-D ULTRAFINE III SHORT PEN 31G X 8 MM Misc  Generic drug:  Insulin Pen Needle  as directed.     atorvastatin 40 MG tablet  Commonly known as:  LIPITOR  Take 40 mg by mouth daily.     finasteride 5 MG tablet  Commonly known as:  PROSCAR  Take 5 mg by mouth daily.     LANTUS SOLOSTAR 100 UNIT/ML Solostar Pen  Generic drug:  Insulin Glargine  Inject 40 units daily     lisinopril 20 MG tablet  Commonly known as:  PRINIVIL,ZESTRIL  Take 20 mg by mouth daily.     omeprazole 20 MG capsule  Commonly known as:  PRILOSEC  Take by mouth.     sildenafil 100 MG tablet  Commonly known as:  VIAGRA  TAKE 1/2-1 TABLET BY MOUTH AS NEEDED        Allergies: No Known Allergies  Family History: History reviewed. No pertinent family history.  Social History:  reports that he has been smoking Cigarettes.  He has been smoking about 0.50 packs per day. He does not have any smokeless tobacco  history on file. He reports that he does not drink alcohol or use illicit drugs.  ROS: UROLOGY Frequent Urination?: No Hard to postpone urination?: No Burning/pain with urination?: No Get up at night to urinate?: No Leakage of urine?: Yes Urine stream starts and stops?: Yes Trouble starting stream?: Yes Do you have to strain to urinate?: No Blood in urine?: No Urinary tract infection?: No Sexually transmitted disease?: No Injury to kidneys or bladder?: No Painful intercourse?: No Weak stream?: Yes Erection problems?: Yes Penile pain?: No  Gastrointestinal Nausea?: No Vomiting?: No Indigestion/heartburn?: No Diarrhea?: No Constipation?:  No  Constitutional Fever: No Night sweats?: No Weight loss?: No Fatigue?: No  Skin Skin rash/lesions?: No Itching?: No  Eyes Blurred vision?: No Double vision?: No  Ears/Nose/Throat Sinus problems?: No  Hematologic/Lymphatic Swollen glands?: No Easy bruising?: No  Cardiovascular Leg swelling?: No Chest pain?: No  Respiratory Cough?: No Shortness of breath?: No  Endocrine Excessive thirst?: Yes  Musculoskeletal Back pain?: No Joint pain?: No  Neurological Headaches?: No Dizziness?: No  Psychologic Depression?: Yes Anxiety?: Yes  Physical Exam: BP 99/63 mmHg  Pulse 71  Ht 6\' 2"  (1.88 m)  Wt 180 lb 8 oz (81.874 kg)  BMI 23.16 kg/m2  Constitutional:  Alert and oriented, No acute distress. HEENT: [place] AT, moist mucus membranes.  Trachea midline, no masses. Cardiovascular: No clubbing, cyanosis, or edema. Respiratory: Normal respiratory effort, no increased work of breathing. Skin: No rashes, bruises or suspicious lesions. Neurologic: Grossly intact, no focal deficits, moving all 4 extremities.  Left lower leg surgically absent.   Psychiatric: Normal mood and affect.  Laboratory Data: Lab Results  Component Value Date   WBC 11.9* 11/15/2014   HGB 11.5* 11/15/2014   HCT 34.5* 11/15/2014   MCV 87 11/15/2014   PLT 173 11/15/2014    Lab Results  Component Value Date   CREATININE 0.91 11/15/2014    Urinalysis Results for orders placed or performed in visit on 03/10/15  Microscopic Examination  Result Value Ref Range   WBC, UA 0-5 0 -  5 /hpf   RBC, UA None seen 0 -  2 /hpf   Epithelial Cells (non renal) None seen 0 - 10 /hpf   Casts Present (A) None seen /lpf   Cast Type HYAC N/A   Mucus, UA Present (A) Not Estab.   Bacteria, UA None seen None seen/Few  Urinalysis, Complete  Result Value Ref Range   Specific Gravity, UA >1.030 (H) 1.005 - 1.030   pH, UA 5.5 5.0 - 7.5   Color, UA Yellow Yellow   Appearance Ur Clear Clear   Leukocytes, UA  Negative Negative   Protein, UA 3+ (A) Negative/Trace   Glucose, UA Negative Negative   Ketones, UA Trace (A) Negative   RBC, UA Negative Negative   Bilirubin, UA NPOS Negative   Urobilinogen, Ur 1.0 0.2 - 1.0 mg/dL   Nitrite, UA Negative Negative   Microscopic Examination See below:       Assessment & Plan:   59 yo s/p robotic radical prostecomy (nonnerve sparing) with BPLND in 10/2014, Pathology was consistent with Gleason 5+4 involving up to 20% of the prostatte, bilaterally. Margins negative. Evidence of extraacapsular extension. No seminal vesicle involvement. Lymph nodes were negative bilaterally. pT2cN0M0.  1. Prostate cancer PSA drawn today, will call with results.  Recommend PSA check q3 months.  Patient aware of risk of needed adjuvant XRT if PSA starts to rise. - PSA - PSA; Standing  2. Urinary incontinence, male,  stress Improving, now 2 ppd from 4 ppd.  Encouraged Kegel exercises and return to PT. - Urinalysis, Complete  3. Erectile dysfunction following radical prostatectomy Baseline erectile dysfunction preop.  Unable to achieve erection post op, minimal bother at this time.  4.  Pulmonary nodule Incidental, need repeat CT scan 08/2015  Return in about 3 months (around 06/10/2015) for PSA.   [name], MD  [facility] [address] [place], [place] [address] [phone]

## 2015-03-11 LAB — PSA: Prostate Specific Ag, Serum: <0.1 ng/mL (ref 0.0–4.0)

## 2015-03-13 ENCOUNTER — Telehealth: Payer: Self-pay

## 2015-03-13 NOTE — Telephone Encounter (Signed)
Spoke with pt in reference to PSA results. Pt voiced understanding.  

## 2015-03-13 NOTE — Telephone Encounter (Signed)
-----   Message from Hollice Espy, MD sent at 03/13/2015  1:08 PM EDT ----- Please let Mr. Kundinger know that his PSA remains undetectable!!!!!!!  Great news.    Hollice Espy, MD

## 2015-06-14 ENCOUNTER — Other Ambulatory Visit: Payer: Self-pay | Admitting: Urology

## 2015-06-14 DIAGNOSIS — Z8546 Personal history of malignant neoplasm of prostate: Secondary | ICD-10-CM

## 2015-06-15 ENCOUNTER — Other Ambulatory Visit: Payer: Self-pay | Admitting: Urology

## 2015-06-15 DIAGNOSIS — N400 Enlarged prostate without lower urinary tract symptoms: Secondary | ICD-10-CM

## 2015-06-15 NOTE — Telephone Encounter (Signed)
Medication refilled

## 2015-06-21 ENCOUNTER — Telehealth: Payer: Self-pay | Admitting: Urology

## 2015-06-21 NOTE — Telephone Encounter (Signed)
Patient's prostate has been removed.  He doesn't need to take the finasteride anymore.

## 2015-06-27 ENCOUNTER — Ambulatory Visit: Payer: BC Managed Care – PPO | Admitting: Urology

## 2015-06-27 ENCOUNTER — Telehealth: Payer: Self-pay | Admitting: Urology

## 2015-06-27 ENCOUNTER — Encounter: Payer: Self-pay | Admitting: Urology

## 2015-06-27 NOTE — Telephone Encounter (Signed)
Allen Chavez was a no show to clinic today. It is extremely follow-up with a PSA in the near future and reschedule his appointment.  At minimum, he needs to get a PSA every 3 months.  Please help him rescheduled as soon as possible.  Hollice Espy, MD

## 2015-06-28 NOTE — Telephone Encounter (Signed)
I called to try to reschd his appointment. I tried to leave a message but his machine was acting up. I don't think it took it. I will try him back again later. I think he is in class right now.  Thanks,  Sharyn Lull

## 2015-07-04 NOTE — Telephone Encounter (Signed)
I have reached out to Mariea Clonts with the cancer center and she is going to try and help [place] reach Mr. Petite.  Thanks,  Sharyn Lull

## 2015-07-13 ENCOUNTER — Telehealth: Payer: Self-pay

## 2015-07-13 NOTE — Telephone Encounter (Signed)
  Oncology Nurse Navigator Documentation    Navigator Encounter Type: Telephone (07/13/15 1459) Patient Visit Type: Follow-up (07/13/15 1459)                    Time Spent with Patient: 15 (07/13/15 1459)   Allen Chavez returned call. He is aware of his January 6th appt at [facility] with Dr Erlene Quan.

## 2015-07-13 NOTE — Telephone Encounter (Signed)
  Oncology Nurse Navigator Documentation    Navigator Encounter Type: Telephone (07/13/15 1446) Patient Visit Type: Follow-up (07/13/15 1446)                    Time Spent with Patient: 15 (07/13/15 1446)   Was able to reach Allen Chavez (emergency contact). They are neighbors. She states Allen Chavez is still in [place]. He was coming back in November but decided to stay through Christmas and New Years. He will be returning In January. She stated he was aware that he needed to rescheduled his appt for Allen Chavez. She provided his father Allen Chavez) phone number in [place]. [phone]. Called and spoke with Allen Chavez and he took down my number and will have Allen Chavez call me as soon as he returns today.

## 2015-07-13 NOTE — Telephone Encounter (Signed)
  Oncology Nurse Navigator Documentation    Navigator Encounter Type: Telephone (07/13/15 1400) Patient Visit Type: Follow-up (07/13/15 1400)                    Time Spent with Patient: 15 (07/13/15 1400)   Another attempt made to contact Allen Chavez regarding follow up at [facility]. This time no answering picked up at home number. Attempted to call work number and it is [employer]. They stated he retired last year and no longer works there.

## 2015-08-04 ENCOUNTER — Encounter: Payer: Self-pay | Admitting: Urology

## 2015-08-04 ENCOUNTER — Ambulatory Visit (INDEPENDENT_AMBULATORY_CARE_PROVIDER_SITE_OTHER): Payer: BC Managed Care – PPO | Admitting: Urology

## 2015-08-04 VITALS — BP 98/61 | HR 98 | Ht 74.0 in | Wt 189.1 lb

## 2015-08-04 DIAGNOSIS — C61 Malignant neoplasm of prostate: Secondary | ICD-10-CM | POA: Diagnosis not present
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
## 2015-08-05 LAB — PSA

## 2015-08-08 ENCOUNTER — Telehealth: Payer: Self-pay

## 2015-08-08 NOTE — Telephone Encounter (Signed)
LMOM

## 2015-08-08 NOTE — Telephone Encounter (Signed)
-----   Message from Hollice Espy, MD sent at 08/06/2015  2:31 PM EST ----- Awesome, awesome, awesome!!!!! PSA remains undetectable.    Hollice Espy, MD

## 2015-08-08 NOTE — Telephone Encounter (Signed)
Spoke with pt in reference to PSA results. Pt voiced understanding.  

## 2015-09-01 ENCOUNTER — Ambulatory Visit
Admission: RE | Admit: 2015-09-01 | Discharge: 2015-09-01 | Disposition: A | Discharge status: Home / Self Care | Payer: BC Managed Care – PPO | Source: Ambulatory Visit | Attending: Urology | Admitting: Urology

## 2015-09-01 DIAGNOSIS — C61 Malignant neoplasm of prostate: Secondary | ICD-10-CM | POA: Diagnosis present

## 2015-09-01 DIAGNOSIS — R918 Other nonspecific abnormal finding of lung field: Secondary | ICD-10-CM | POA: Diagnosis not present

## 2015-09-04 ENCOUNTER — Telehealth: Payer: Self-pay

## 2015-09-04 NOTE — Telephone Encounter (Signed)
-----   Message from Hollice Espy, MD sent at 09/01/2015 12:10 PM EST ----- Please let [name] know that his CT chest looks stable.  We may repeat in 1 year.  Hollice Espy, MD

## 2015-09-04 NOTE — Telephone Encounter (Signed)
Spoke with pt in reference to CT. Pt voiced understanding.

## 2015-11-02 ENCOUNTER — Other Ambulatory Visit: Payer: BC Managed Care – PPO

## 2016-05-30 DIAGNOSIS — M79671 Pain in right foot: Secondary | ICD-10-CM

## 2017-09-16 IMAGING — CT CT CHEST W/O CM
1 series · 15 of 31 positions shown, 19 images · non-contrast
Comparison: 11/08/2014.

CLINICAL DATA: Pulmonary nodule.

EXAM:
CT CHEST WITHOUT CONTRAST
TECHNIQUE: Multidetector CT imaging of the chest was performed following the
standard protocol without IV contrast.

[Series 2: thorax · axial · 0.75mm/px · z∈[-623,-308]mm · 15 of 69 slices shown, 19 images]
[im 3/69  mediastinal]
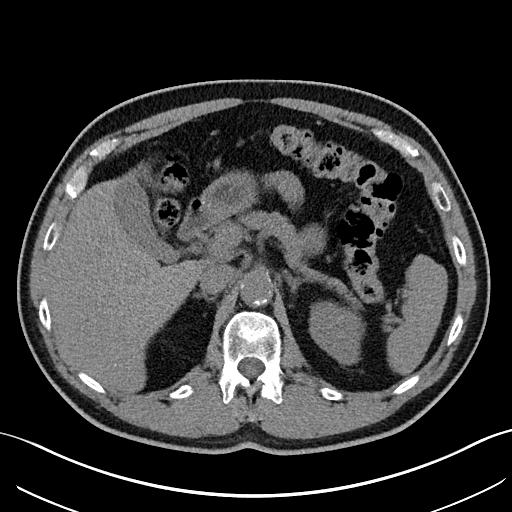
[im 3/69  lung]
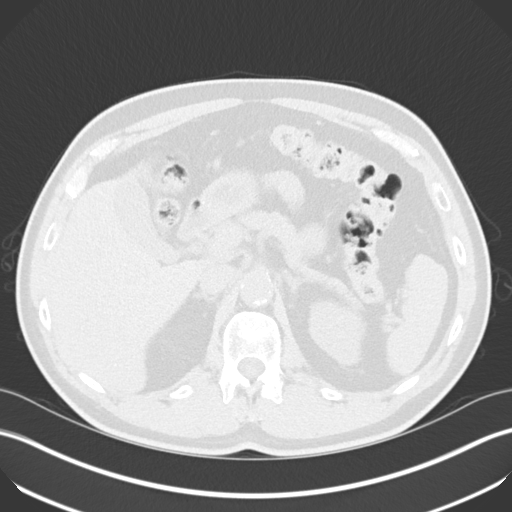
[im 8/69  lung]
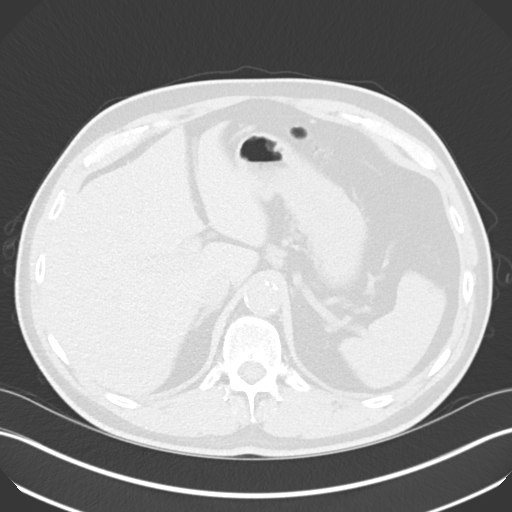
[im 13/69  lung]
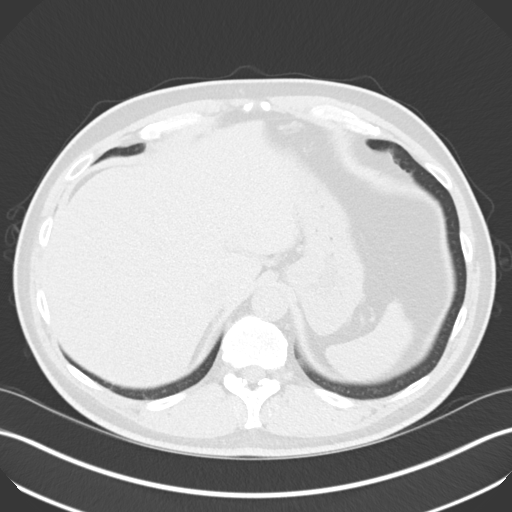
[im 16/69  lung]
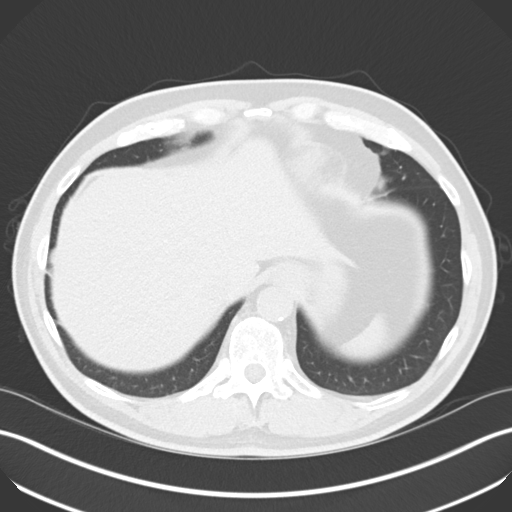
[im 21/69  mediastinal]
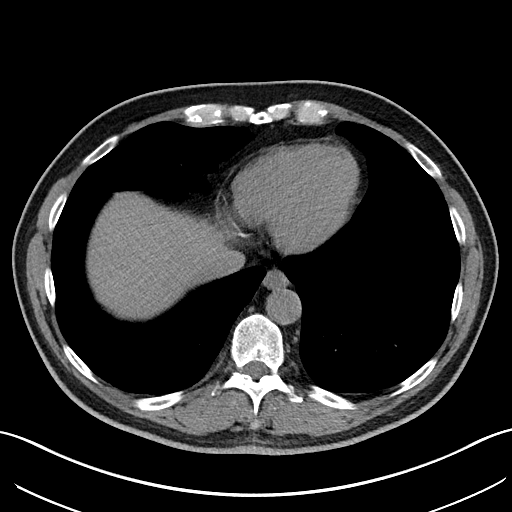
[im 21/69  lung]
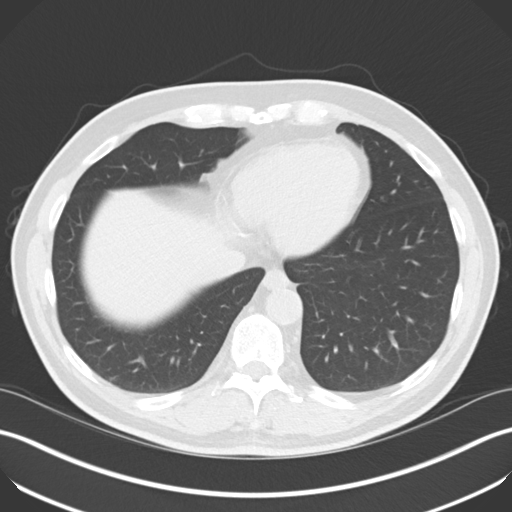
[im 26/69  lung]
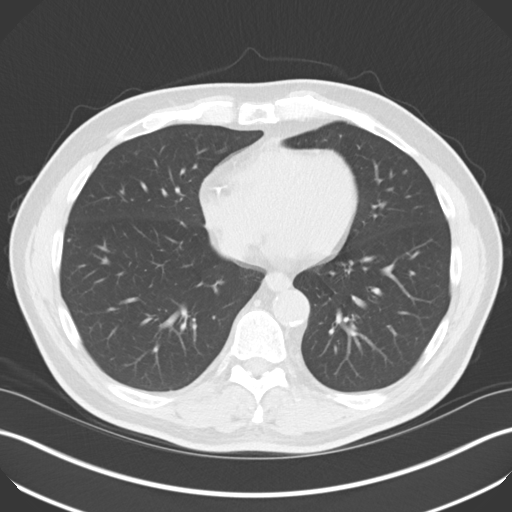
[im 31/69  lung]
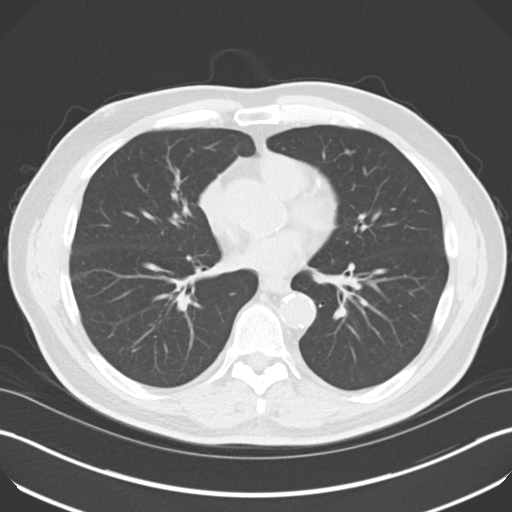
[im 36/69  lung]
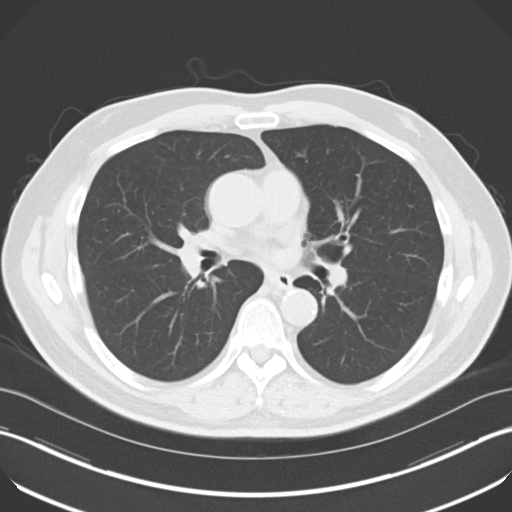
[im 38/69  mediastinal]
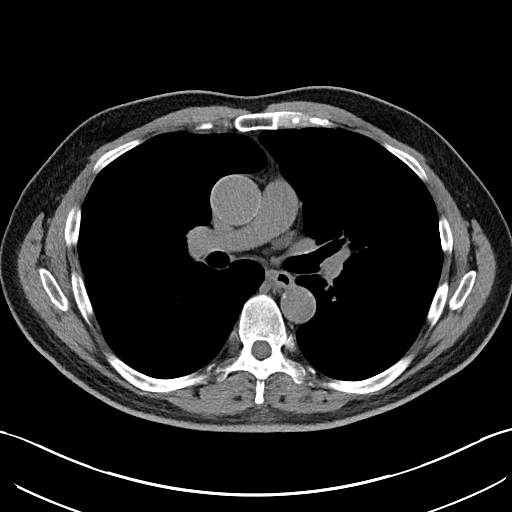
[im 38/69  lung]
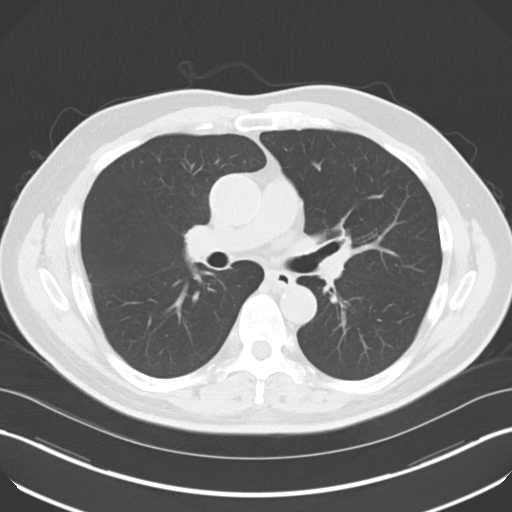
[im 43/69  lung]
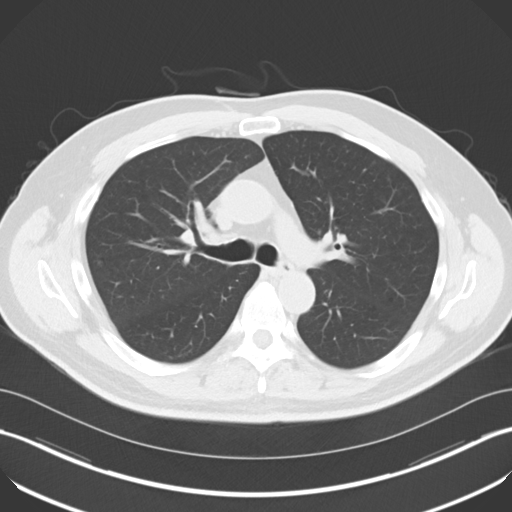
[im 48/69  lung]
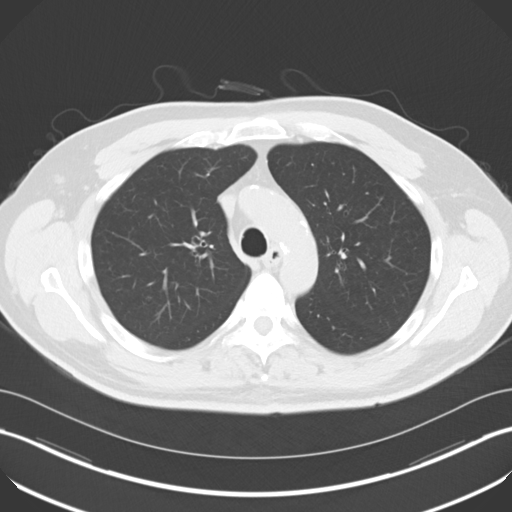
[im 53/69  lung]
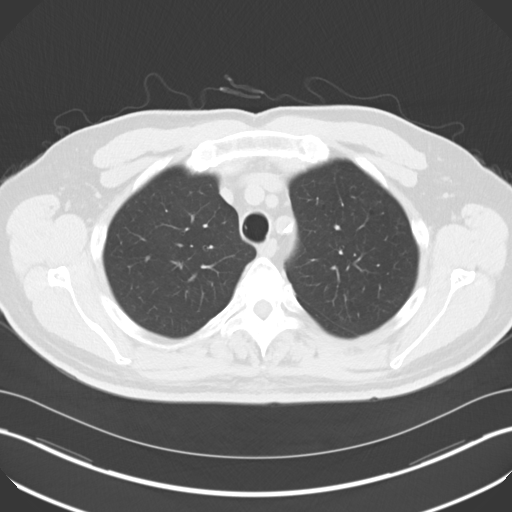
[im 56/69  mediastinal]
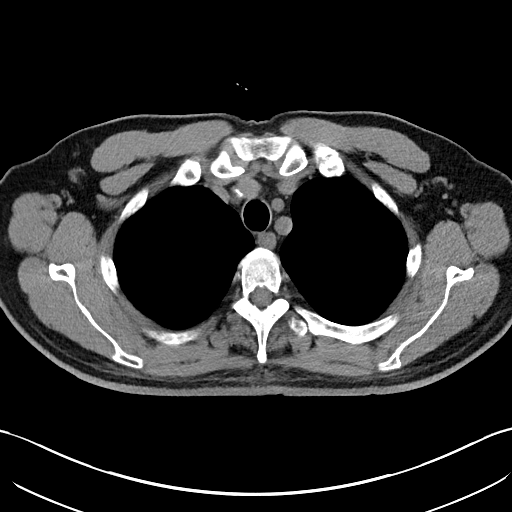
[im 56/69  lung]
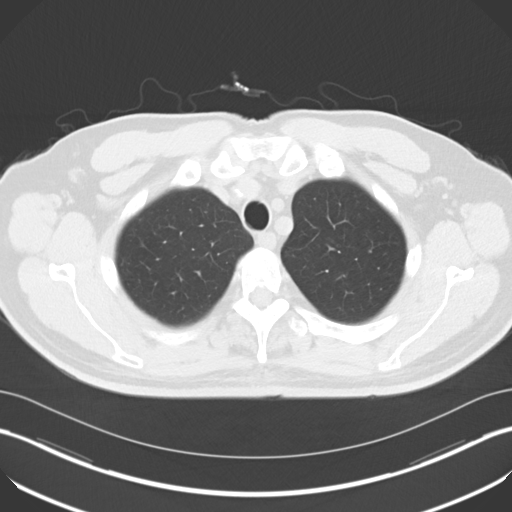
[im 61/69  lung]
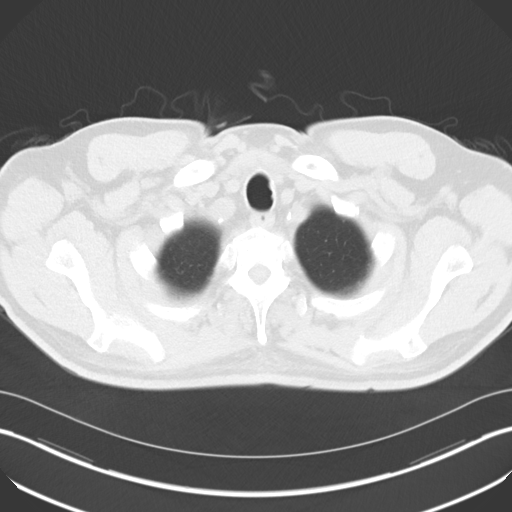
[im 66/69  lung]
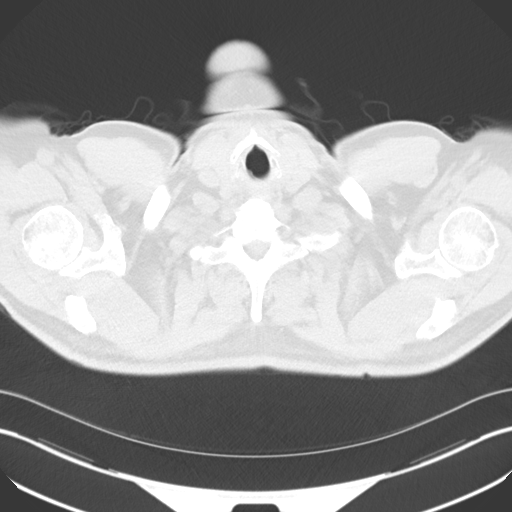

[15 of 31 positions shown; findings below may reference images not displayed]

FINDINGS: Mediastinum / Lymph Nodes: There is no axillary lymphadenopathy. No
mediastinal lymphadenopathy. There is no hilar lymphadenopathy. The
heart size is normal. No pericardial effusion. Coronary artery
calcification is noted. Small lymph nodes are seen adjacent to the
midesophagus with stable appearance in the 10 month interval since
prior study.

Lungs / Pleura: Changes of emphysema noted bilaterally. Tiny right
middle lobe nodule seen previously appears less conspicuous today
and measures about 2 mm on image 39 of series [DATE] mm right lower
lobe pulmonary nodule seen on image 45 is unchanged. 5 mm lingular
nodule on image 44 is stable. No new or progressing pulmonary nodule
is evident. No focal airspace consolidation. No pulmonary edema or
pleural effusion.

Upper Abdomen:  Unremarkable.

[HOSPITAL] / Soft Tissues: Bone windows reveal no worrisome lytic or
sclerotic osseous lesions.
IMPRESSION: Stable exam. Scattered tiny bilateral pulmonary nodules are
unchanged. While this interval stability is reassuring, consider an
additional follow-up CT in 12 months given the history of cancer.

Stable ascending thoracic aorta 3.4 cm diameter. Recommend annual
imaging followup by CTA or MRA. This recommendation follows 3222
ACCF/AHA/AATS/ACR/ASA/SCA/ASOD/MANTSHAPELO/METHIW/TIGER Guidelines for the
Diagnosis and Management of Patients with Thoracic Aortic Disease.
Circulation. 3222; 121: [id number].

## 2020-10-23 ENCOUNTER — Other Ambulatory Visit: Payer: Self-pay

## 2020-10-23 ENCOUNTER — Ambulatory Visit (INDEPENDENT_AMBULATORY_CARE_PROVIDER_SITE_OTHER): Payer: Medicare Other | Admitting: Dermatology

## 2020-10-23 DIAGNOSIS — L821 Other seborrheic keratosis: Secondary | ICD-10-CM | POA: Diagnosis not present

## 2020-10-23 DIAGNOSIS — C4441 Basal cell carcinoma of skin of scalp and neck: Secondary | ICD-10-CM

## 2020-10-23 DIAGNOSIS — D485 Neoplasm of uncertain behavior of skin: Secondary | ICD-10-CM

## 2020-10-23 DIAGNOSIS — L814 Other melanin hyperpigmentation: Secondary | ICD-10-CM | POA: Diagnosis not present

## 2020-10-23 DIAGNOSIS — Z1283 Encounter for screening for malignant neoplasm of skin: Secondary | ICD-10-CM | POA: Diagnosis not present

## 2020-10-23 DIAGNOSIS — D229 Melanocytic nevi, unspecified: Secondary | ICD-10-CM

## 2020-10-23 DIAGNOSIS — L578 Other skin changes due to chronic exposure to nonionizing radiation: Secondary | ICD-10-CM

## 2020-10-23 DIAGNOSIS — L570 Actinic keratosis: Secondary | ICD-10-CM

## 2020-10-23 DIAGNOSIS — D1800 Hemangioma unspecified site: Secondary | ICD-10-CM

## 2020-10-23 NOTE — Patient Instructions (Signed)

## 2020-10-23 NOTE — Progress Notes (Unsigned)
New Patient Visit  Subjective  Allen Chavez is a 65 y.o. male who presents for the following: Lesion (Of the L neck that has been present for about 5 months now - irregular, growing larger. Patient would also like his upper body checked today. ). The patient presents for Upper Body Skin Exam (UBSE) for skin cancer screening and mole check.  The following portions of the chart were reviewed this encounter and updated as appropriate:   Allergies  Meds  Problems  Med Hx  Surg Hx  Fam Hx     Review of Systems:  No other skin or systemic complaints except as noted in HPI or Assessment and Plan.  Objective  Well appearing patient in no apparent distress; mood and affect are within normal limits.  All skin waist up examined.  Objective  L neck: 1.7 cm Hyperkeratotic papule   Objective  L forearm below the elbow x 1: Erythematous thin papules/macules with gritty scale.   Assessment & Plan  Neoplasm of uncertain behavior of skin L neck  Epidermal / dermal shaving  Lesion diameter (cm):  1.7 Informed consent: discussed and consent obtained   Timeout: patient name, date of birth, surgical site, and procedure verified   Procedure prep:  Patient was prepped and draped in usual sterile fashion Prep type:  Isopropyl alcohol Anesthesia: the lesion was anesthetized in a standard fashion   Anesthetic:  1% lidocaine w/ epinephrine 1-100,000 buffered w/ 8.4% NaHCO3 Instrument used: flexible razor blade   Hemostasis achieved with: pressure, aluminum chloride and electrodesiccation   Outcome: patient tolerated procedure well   Post-procedure details: sterile dressing applied and wound care instructions given   Dressing type: bandage and petrolatum    Destruction of lesion Complexity: extensive   Destruction method: electrodesiccation and curettage   Informed consent: discussed and consent obtained   Timeout:  patient name, date of birth, surgical site, and procedure  verified Procedure prep:  Patient was prepped and draped in usual sterile fashion Prep type:  Isopropyl alcohol Anesthesia: the lesion was anesthetized in a standard fashion   Anesthetic:  1% lidocaine w/ epinephrine 1-100,000 buffered w/ 8.4% NaHCO3 Curettage performed in three different directions: Yes   Electrodesiccation performed over the curetted area: Yes   Lesion length (cm):  1.7 Lesion width (cm):  1.7 Margin per side (cm):  0.2 Final wound size (cm):  2.1 Hemostasis achieved with:  pressure, aluminum chloride and electrodesiccation Outcome: patient tolerated procedure well with no complications   Post-procedure details: sterile dressing applied and wound care instructions given   Dressing type: bandage and petrolatum    Specimen 1 - Surgical pathology Differential Diagnosis: D48.5 r/o SCC  ED&C  Check Margins: No  AK (actinic keratosis) L forearm below the elbow x 1  Destruction of lesion - L forearm below the elbow x 1 Complexity: simple   Destruction method: cryotherapy   Informed consent: discussed and consent obtained   Timeout:  patient name, date of birth, surgical site, and procedure verified Lesion destroyed using liquid nitrogen: Yes   Region frozen until ice ball extended beyond lesion: Yes   Outcome: patient tolerated procedure well with no complications   Post-procedure details: wound care instructions given    Skin cancer screening   Lentigines - Scattered tan macules - Due to sun exposure - Benign-appering, observe - Recommend daily broad spectrum sunscreen SPF 30+ to sun-exposed areas, reapply every 2 hours as needed. - Call for any changes  Seborrheic Keratoses - Stuck-on, waxy,  tan-brown papules and/or plaques  - Benign-appearing - Discussed benign etiology and prognosis. - Observe - Call for any changes  Melanocytic Nevi - Tan-brown and/or pink-flesh-colored symmetric macules and papules - Benign appearing on exam today -  Observation - Call clinic for new or changing moles - Recommend daily use of broad spectrum spf 30+ sunscreen to sun-exposed areas.   Hemangiomas - Red papules - Discussed benign nature - Observe - Call for any changes  Actinic Damage - Chronic condition, secondary to cumulative UV/sun exposure - diffuse scaly erythematous macules with underlying dyspigmentation - Recommend daily broad spectrum sunscreen SPF 30+ to sun-exposed areas, reapply every 2 hours as needed.  - Staying in the shade or wearing long sleeves, sun glasses (UVA+UVB protection) and wide brim hats (4-inch brim around the entire circumference of the hat) are also recommended for sun protection.  - Call for new or changing lesions.  Skin cancer screening performed today.  Follow up 6 mos. Luther Redo, CMA, am acting as scribe for Sarina Ser, MD .  Documentation: I have reviewed the above documentation for accuracy and completeness, and I agree with the above.  Sarina Ser, MD

## 2020-10-25 ENCOUNTER — Encounter: Payer: Self-pay | Admitting: Dermatology

## 2020-10-25 DIAGNOSIS — C4491 Basal cell carcinoma of skin, unspecified: Secondary | ICD-10-CM

## 2020-10-25 HISTORY — DX: Basal cell carcinoma of skin, unspecified: C44.91

## 2020-10-26 ENCOUNTER — Telehealth: Payer: Self-pay

## 2020-10-26 NOTE — Telephone Encounter (Signed)
No answer and no voicemail box set up.

## 2020-10-26 NOTE — Telephone Encounter (Signed)
-----   Message from Ralene Bathe, MD sent at 10/25/2020  5:52 PM EDT ----- Diagnosis Skin , left neck BASAL CELL CARCINOMA, NODULAR PATTERN, BASE INVOLVED  Cancer - BCC Already treated Recheck next visit Keep follow up appt

## 2020-10-30 ENCOUNTER — Telehealth: Payer: Self-pay

## 2020-10-30 NOTE — Telephone Encounter (Signed)
-----   Message from Ralene Bathe, MD sent at 10/25/2020  5:52 PM EDT ----- Diagnosis Skin , left neck BASAL CELL CARCINOMA, NODULAR PATTERN, BASE INVOLVED  Cancer - BCC Already treated Recheck next visit Keep follow up appt

## 2020-10-30 NOTE — Telephone Encounter (Signed)
Patient advised of biopsy results.

## 2021-04-23 ENCOUNTER — Ambulatory Visit (INDEPENDENT_AMBULATORY_CARE_PROVIDER_SITE_OTHER): Payer: Medicare Other | Admitting: Dermatology

## 2021-04-23 ENCOUNTER — Ambulatory Visit: Payer: Medicare Other | Admitting: Dermatology

## 2021-04-23 ENCOUNTER — Other Ambulatory Visit: Payer: Self-pay

## 2021-04-23 DIAGNOSIS — D485 Neoplasm of uncertain behavior of skin: Secondary | ICD-10-CM

## 2021-04-23 DIAGNOSIS — C44619 Basal cell carcinoma of skin of left upper limb, including shoulder: Secondary | ICD-10-CM | POA: Diagnosis not present

## 2021-04-23 DIAGNOSIS — L578 Other skin changes due to chronic exposure to nonionizing radiation: Secondary | ICD-10-CM | POA: Diagnosis not present

## 2021-04-23 DIAGNOSIS — L821 Other seborrheic keratosis: Secondary | ICD-10-CM

## 2021-04-23 DIAGNOSIS — L570 Actinic keratosis: Secondary | ICD-10-CM | POA: Diagnosis not present

## 2021-04-23 DIAGNOSIS — Z85828 Personal history of other malignant neoplasm of skin: Secondary | ICD-10-CM | POA: Diagnosis not present

## 2021-04-23 DIAGNOSIS — L720 Epidermal cyst: Secondary | ICD-10-CM

## 2021-04-23 NOTE — Patient Instructions (Addendum)
Wound Care Instructions  Cleanse wound gently with soap and water once a day then pat dry with clean gauze. Apply a thing coat of Petrolatum (petroleum jelly, "Vaseline") over the wound (unless you have an allergy to this). We recommend that you use a new, sterile tube of Vaseline. Do not pick or remove scabs. Do not remove the yellow or white "healing tissue" from the base of the wound.  Cover the wound with fresh, clean, nonstick gauze and secure with paper tape. You may use Band-Aids in place of gauze and tape if the would is small enough, but would recommend trimming much of the tape off as there is often too much. Sometimes Band-Aids can irritate the skin.  You should call the office for your biopsy report after 1 week if you have not already been contacted.  If you experience any problems, such as abnormal amounts of bleeding, swelling, significant bruising, significant pain, or evidence of infection, please call the office immediately.  FOR ADULT SURGERY PATIENTS: If you need something for pain relief you may take 1 extra strength Tylenol (acetaminophen) AND 2 Ibuprofen (200mg each) together every 4 hours as needed for pain. (do not take these if you are allergic to them or if you have a reason you should not take them.) Typically, you may only need pain medication for 1 to 3 days.   If you have any questions or concerns for your doctor, please call our main line at [phone] and press option 4 to reach your doctor's medical assistant. If no one answers, please leave a voicemail as directed and we will return your call as soon as possible. Messages left after 4 pm will be answered the following business day.   You may also send us a message via MyChart. We typically respond to MyChart messages within 1-2 business days.  For prescription refills, please ask your pharmacy to contact our office. Our fax number is [phone].  If you have an urgent issue when the clinic is closed that  cannot wait until the next business day, you can page your doctor at the number below.    Please note that while we do our best to be available for urgent issues outside of office hours, we are not available 24/7.   If you have an urgent issue and are unable to reach us, you may choose to seek medical care at your doctor's office, retail clinic, urgent care center, or emergency room.  If you have a medical emergency, please immediately call 911 or go to the emergency department.  Pager Numbers  - Dr. Kowalski: [phone]  - Dr. Moye: [phone]  - Dr. Stewart: [phone]  In the event of inclement weather, please call our main line at [phone] for an update on the status of any delays or closures.  Dermatology Medication Tips: Please keep the boxes that topical medications come in in order to help keep track of the instructions about where and how to use these. Pharmacies typically print the medication instructions only on the boxes and not directly on the medication tubes.   If your medication is too expensive, please contact our office at [phone] option 4 or send us a message through MyChart.   We are unable to tell what your co-pay for medications will be in advance as this is different depending on your insurance coverage. However, we may be able to find a substitute medication at lower cost or fill out paperwork to get insurance to cover a needed   medication.   If a prior authorization is required to get your medication covered by your insurance company, please allow us 1-2 business days to complete this process.  Drug prices often vary depending on where the prescription is filled and some pharmacies may offer cheaper prices.  The website www.goodrx.com contains coupons for medications through different pharmacies. The prices here do not account for what the cost may be with help from insurance (it may be cheaper with your insurance), but the website can give you the  price if you did not use any insurance.  - You can print the associated coupon and take it with your prescription to the pharmacy.  - You may also stop by our office during regular business hours and pick up a GoodRx coupon card.  - If you need your prescription sent electronically to a different pharmacy, notify our office through [facility] MyChart or by phone at [phone] option 4.   

## 2021-04-23 NOTE — Progress Notes (Signed)
Follow-Up Visit   Subjective  Allen Chavez is a 65 y.o. male who presents for the following: recheck BCC site (Of the L neck - S/P bx and ED&C ) and Actinic Keratosis (Of the L forearm below elbow - check for persistence).  The following portions of the chart were reviewed this encounter and updated as appropriate:   Allergies  Meds  Problems  Med Hx  Surg Hx  Fam Hx     Review of Systems:  No other skin or systemic complaints except as noted in HPI or Assessment and Plan.  Objective  Well appearing patient in no apparent distress; mood and affect are within normal limits.  A focused examination was performed including the face, chest, back, abdomen and arms. Relevant physical exam findings are noted in the Assessment and Plan.  Face Smooth white papule(s).   Right Ear Erythematous thin papules/macules with gritty scale.   L distal lat bicep Pink patch surrounding scar approximately 1.5 cm       Assessment & Plan  [place] Face Benign-appearing.  Observation.  Call clinic for new or changing lesions.  Recommend daily use of broad spectrum spf 30+ sunscreen to sun-exposed areas.   AK (actinic keratosis) Right Ear Destruction of lesion - Right Ear Complexity: simple   Destruction method: cryotherapy   Informed consent: discussed and consent obtained   Timeout:  patient name, date of birth, surgical site, and procedure verified Lesion destroyed using liquid nitrogen: Yes   Region frozen until ice ball extended beyond lesion: Yes   Outcome: patient tolerated procedure well with no complications   Post-procedure details: wound care instructions given    Destruction of lesion - Right Ear  Neoplasm of uncertain behavior of skin L distal lat bicep Skin / nail biopsy Type of biopsy: tangential   Informed consent: discussed and consent obtained   Timeout: patient name, date of birth, surgical site, and procedure verified   Procedure prep:  Patient was prepped and  draped in usual sterile fashion Prep type:  Isopropyl alcohol Anesthesia: the lesion was anesthetized in a standard fashion   Anesthetic:  1% lidocaine w/ epinephrine 1-100,000 buffered w/ 8.4% NaHCO3 Instrument used: flexible razor blade   Hemostasis achieved with: pressure, aluminum chloride and electrodesiccation   Outcome: patient tolerated procedure well   Post-procedure details: sterile dressing applied and wound care instructions given   Dressing type: bandage and petrolatum    Specimen 1 - Surgical pathology Differential Diagnosis: D48.5 r/o BCC vs other  Check Margins: No  Seborrheic Keratoses - Stuck-on, waxy, tan-brown papules and/or plaques  - Benign-appearing - Discussed benign etiology and prognosis. - Observe - Call for any changes  Actinic Damage - chronic, secondary to cumulative UV radiation exposure/sun exposure over time - diffuse scaly erythematous macules with underlying dyspigmentation - Recommend daily broad spectrum sunscreen SPF 30+ to sun-exposed areas, reapply every 2 hours as needed.  - Recommend staying in the shade or wearing long sleeves, sun glasses (UVA+UVB protection) and wide brim hats (4-inch brim around the entire circumference of the hat). - Call for new or changing lesions.  History of Basal Cell Carcinoma of the Skin - No evidence of recurrence today - Recommend regular full body skin exams - Recommend daily broad spectrum sunscreen SPF 30+ to sun-exposed areas, reapply every 2 hours as needed.  - Call if any new or changing lesions are noted between office visits  Return in about 6 months (around 10/21/2021) for TBSE.  Luther Redo,  CMA, am acting as scribe for Sarina Ser, MD . Documentation: I have reviewed the above documentation for accuracy and completeness, and I agree with the above.  Sarina Ser, MD

## 2021-04-26 ENCOUNTER — Encounter: Payer: Self-pay | Admitting: Dermatology

## 2021-04-26 DIAGNOSIS — C4491 Basal cell carcinoma of skin, unspecified: Secondary | ICD-10-CM

## 2021-04-26 HISTORY — DX: Basal cell carcinoma of skin, unspecified: C44.91

## 2021-04-27 ENCOUNTER — Telehealth: Payer: Self-pay

## 2021-04-27 NOTE — Telephone Encounter (Signed)
No answer

## 2021-04-27 NOTE — Telephone Encounter (Signed)
-----   Message from Ralene Bathe, MD sent at 04/26/2021  2:17 PM EDT ----- Diagnosis Skin , left distal lat bicep SUPERFICIAL BASAL CELL CARCINOMA, PERIPHERAL MARGIN INVOLVED  Cancer - BCC Superficial Schedule for treatment (EDC)

## 2021-05-03 ENCOUNTER — Telehealth: Payer: Self-pay

## 2021-05-03 NOTE — Telephone Encounter (Signed)
-----   Message from Ralene Bathe, MD sent at 04/26/2021  2:17 PM EDT ----- Diagnosis Skin , left distal lat bicep SUPERFICIAL BASAL CELL CARCINOMA, PERIPHERAL MARGIN INVOLVED  Cancer - BCC Superficial Schedule for treatment (EDC)

## 2021-05-03 NOTE — Telephone Encounter (Signed)
Left message for patient to call office for results/hd 

## 2021-05-22 ENCOUNTER — Other Ambulatory Visit: Payer: Self-pay

## 2021-05-22 ENCOUNTER — Ambulatory Visit (INDEPENDENT_AMBULATORY_CARE_PROVIDER_SITE_OTHER): Payer: Medicare Other | Admitting: Dermatology

## 2021-05-22 ENCOUNTER — Encounter: Payer: Self-pay | Admitting: Dermatology

## 2021-05-22 DIAGNOSIS — C44619 Basal cell carcinoma of skin of left upper limb, including shoulder: Secondary | ICD-10-CM | POA: Diagnosis not present

## 2021-05-22 DIAGNOSIS — C4491 Basal cell carcinoma of skin, unspecified: Secondary | ICD-10-CM

## 2021-05-22 NOTE — Patient Instructions (Signed)
Electrodesiccation and Curettage (“Scrape and Burn”) Wound Care Instructions ° °Leave the original bandage on for 24 hours if possible.  If the bandage becomes soaked or soiled before that time, it is OK to remove it and examine the wound.  A small amount of post-operative bleeding is normal.  If excessive bleeding occurs, remove the bandage, place gauze over the site and apply continuous pressure (no peeking) over the area for 30 minutes. If this does not work, please call our clinic as soon as possible or page your doctor if it is after hours.  ° °Once a day, cleanse the wound with soap and water. It is fine to shower. If a thick crust develops you may use a Q-tip dipped into dilute hydrogen peroxide (mix 1:1 with water) to dissolve it.  Hydrogen peroxide can slow the healing process, so use it only as needed.   ° °After washing, apply petroleum jelly (Vaseline) or an antibiotic ointment if your doctor prescribed one for you, followed by a bandage.   ° °For best healing, the wound should be covered with a layer of ointment at all times. If you are not able to keep the area covered with a bandage to hold the ointment in place, this may mean re-applying the ointment several times a day.  Continue this wound care until the wound has healed and is no longer open. It may take several weeks for the wound to heal and close. ° °Itching and mild discomfort is normal during the healing process. ° °If you have any discomfort, you can take Tylenol (acetaminophen) or ibuprofen as directed on the bottle. (Please do not take these if you have an allergy to them or cannot take them for another reason). ° °Some redness, tenderness and white or yellow material in the wound is normal healing.  If the area becomes very sore and red, or develops a thick yellow-green material (pus), it may be infected; please notify us.   ° °Wound healing continues for up to one year following surgery. It is not unusual to experience pain in the scar  from time to time during the interval.  If the pain becomes severe or the scar thickens, you should notify the office.   ° °A slight amount of redness in a scar is expected for the first six months.  After six months, the redness will fade and the scar will soften and fade.  The color difference becomes less noticeable with time.  If there are any problems, return for a post-op surgery check at your earliest convenience. ° °To improve the appearance of the scar, you can use silicone scar gel, cream, or sheets (such as Mederma or Serica) every night for up to one year. These are available over the counter (without a prescription). ° °Please call our office at [phone] for any questions or concerns. °

## 2021-05-22 NOTE — Progress Notes (Signed)
   Follow-Up Visit   Subjective  Allen Chavez is a 65 y.o. male who presents for the following: bx proven BCC (L distal lat bicep - patient is here today for [facility] ).   The following portions of the chart were reviewed this encounter and updated as appropriate:   Allergies  Meds  Problems  Med Hx  Surg Hx  Fam Hx      Review of Systems:  No other skin or systemic complaints except as noted in HPI or Assessment and Plan.  Objective  Well appearing patient in no apparent distress; mood and affect are within normal limits.  A focused examination was performed including the face and extremities. Relevant physical exam findings are noted in the Assessment and Plan.  L distal lat bicep Pink biopsy site.   Assessment & Plan  Superficial basal cell carcinoma L distal lat bicep  Destruction of lesion Complexity: extensive   Destruction method: electrodesiccation and curettage   Informed consent: discussed and consent obtained   Timeout:  patient name, date of birth, surgical site, and procedure verified Procedure prep:  Patient was prepped and draped in usual sterile fashion Prep type:  Isopropyl alcohol Anesthesia: the lesion was anesthetized in a standard fashion   Anesthetic:  1% lidocaine w/ epinephrine 1-100,000 buffered w/ 8.4% NaHCO3 Curettage performed in three different directions: Yes   Electrodesiccation performed over the curetted area: Yes   Final wound size (cm):  5.2 Hemostasis achieved with:  pressure, aluminum chloride and electrodesiccation Outcome: patient tolerated procedure well with no complications   Post-procedure details: sterile dressing applied and wound care instructions given   Dressing type: bandage and petrolatum    Bx proven   Return for appointment as scheduled.  Luther Redo, CMA, am acting as scribe for Sarina Ser, MD . Documentation: I have reviewed the above documentation for accuracy and completeness, and I agree with the  above.  Sarina Ser, MD

## 2021-05-23 ENCOUNTER — Encounter: Payer: Self-pay | Admitting: Dermatology

## 2021-08-09 ENCOUNTER — Encounter (HOSPITAL_BASED_OUTPATIENT_CLINIC_OR_DEPARTMENT_OTHER)

## 2021-08-09 ENCOUNTER — Encounter

## 2021-08-09 ENCOUNTER — Other Ambulatory Visit

## 2021-08-09 ENCOUNTER — Emergency Department: Payer: MEDICARE

## 2021-08-09 ENCOUNTER — Emergency Department: Admit: 2021-08-10 | Payer: MEDICARE

## 2021-08-09 DIAGNOSIS — E1110 Type 2 diabetes mellitus with ketoacidosis without coma: Secondary | ICD-10-CM

## 2021-08-09 LAB — MANUAL DIFFERENTIAL
Lymphocytes %: 10 %
Lymphocytes Absolute: 1.67 10*3/uL (ref 0.70–4.00)
Monocytes %: 5 %
Monocytes Absolute: 0.84 10*3/uL — ABNORMAL HIGH (ref 0.38–0.83)
Neutrophils %: 85 %
Neutrophils Absolute: 14.2 10*3/uL — ABNORMAL HIGH (ref 1.50–7.95)
PLT Morphology: NORMAL
RBC Morphology: NORMAL
Total WBC Counted: 100

## 2021-08-09 LAB — POCT GLUCOSE: POCT Glucose: >600 mg/dL (ref 70–110)

## 2021-08-09 LAB — BLOOD GAS, VENOUS
Base Excess. Calculated: 6 mmol/L
Flow Rate: 2 L/min
O2 Sat (Estimated): 92 % — ABNORMAL HIGH (ref 50–70)
pCO2: 52 mmHg — ABNORMAL HIGH (ref 41–51)
pH: 7.38 (ref 7.32–7.42)
pO2: 68 mmHg

## 2021-08-09 LAB — COMPREHENSIVE METABOLIC PANEL
ALT: 17 U/L (ref 0–55)
AST: 6 U/L (ref 6–42)
Albumin: 4 g/dL (ref 3.2–5.0)
Alkaline phosphatase: 145 U/L — ABNORMAL HIGH (ref 30–130)
Anion Gap: 25 mmol/L — ABNORMAL HIGH (ref 3–14)
BUN: 90 mg/dL — ABNORMAL HIGH (ref 6–24)
Bilirubin, total: 0.7 mg/dL (ref 0.2–1.2)
CO2 (Bicarbonate): 29 mmol/L (ref 20–32)
Calcium: 10.3 mg/dL (ref 8.5–10.5)
Chloride: 76 mmol/L — ABNORMAL LOW (ref 98–110)
Creatinine: 3.91 mg/dL — ABNORMAL HIGH (ref 0.55–1.30)
Glucose: 924 mg/dL (ref 70–110)
Potassium: 4.9 mmol/L (ref 3.6–5.2)
Protein, total: 8.7 g/dL — ABNORMAL HIGH (ref 6.0–8.4)
Sodium: 130 mmol/L — ABNORMAL LOW (ref 135–146)
eGFRcr: 16 mL/min/{1.73_m2} — ABNORMAL LOW (ref >=60)

## 2021-08-09 LAB — SARS/FLU/RSV
Influenza A RNA: NOT DETECTED
Influenza B RNA: NOT DETECTED
Respiratory syncytial virus: NOT DETECTED
SARS-CoV-2 RNA PCR: NOT DETECTED

## 2021-08-09 LAB — LIGHT BLUE TOP

## 2021-08-09 LAB — CBC WITH DIFFERENTIAL
Hematocrit: 47.4 % (ref 37.0–53.0)
Hemoglobin: 16.5 g/dL (ref 13.0–17.5)
MCH: 28.1 pg (ref 26.0–34.0)
MCHC: 34.8 g/dL (ref 31.0–37.0)
MCV: 80.7 fL (ref 80.0–100.0)
MPV: 10.5 fL (ref 9.1–12.4)
NRBC %: 0 % (ref 0.0–0.0)
NRBC Absolute: 0 10*3/uL (ref 0.00–2.00)
Platelets: 310 10*3/uL (ref 150–400)
RBC: 5.87 M/uL (ref 4.20–5.90)
RDW-CV: 13.6 % (ref 11.5–14.5)
RDW-SD: 39.4 fL (ref 35.0–51.0)
WBC: 16.7 10*3/uL — ABNORMAL HIGH (ref 4.0–11.0)

## 2021-08-09 LAB — TRIGLYCERIDES: Triglycerides: 226 mg/dL — ABNORMAL HIGH (ref <=150)

## 2021-08-09 LAB — RAINBOW DRAW SST GOLD TOP

## 2021-08-09 LAB — LIPASE: Lipase: 1186 U/L — ABNORMAL HIGH (ref 13–75)

## 2021-08-09 LAB — BETA HYDROXYBUTYRATE: Beta Hydroxybutyrate: >4.5 mmol/L — ABNORMAL HIGH (ref 0.00–0.27)

## 2021-08-09 LAB — MAGNESIUM: Magnesium: 3.5 mg/dL — ABNORMAL HIGH (ref 1.6–2.6)

## 2021-08-09 MED ORDER — ondansetron (Zofran) injection 4 mg
4 | Freq: Three times a day (TID) | INTRAMUSCULAR | Status: DC | PRN
Start: 2021-08-09 — End: 2021-08-14
  Administered 2021-08-10 (×2): 4 mg via INTRAVENOUS

## 2021-08-09 MED ORDER — acetaminophen (Tylenol) solution 650 mg
325 | Freq: Four times a day (QID) | ORAL | Status: DC | PRN
Start: 2021-08-09 — End: 2021-08-14
  Administered 2021-08-10: 09:00:00 650 mg via ORAL

## 2021-08-09 MED ORDER — insulin regular in NS (HumuLIN R,NovoLIN R) 100 unit/100 mL (1 unit/mL) infusion
100 | INTRAVENOUS | Status: DC
Start: 2021-08-09 — End: 2021-08-11
  Administered 2021-08-10: 16:00:00 0.25 [IU]/h via INTRAVENOUS
  Administered 2021-08-10: 06:00:00 8.2 [IU]/h via INTRAVENOUS

## 2021-08-09 MED ORDER — heparin (porcine) injection 5,000 Units
5000 | Freq: Three times a day (TID) | INTRAMUSCULAR | Status: DC
Start: 2021-08-09 — End: 2021-08-14
  Administered 2021-08-12 – 2021-08-14 (×9): 5000 [IU] via SUBCUTANEOUS

## 2021-08-09 MED ORDER — dextrose (D10W) 10 % bolus 125-250 mL
10 | INTRAVENOUS | Status: DC | PRN
Start: 2021-08-09 — End: 2021-08-09

## 2021-08-09 MED ORDER — morphine injection 4 mg
4 | Freq: Once | INTRAVENOUS | Status: AC
Start: 2021-08-09 — End: 2021-08-09
  Administered 2021-08-10: 05:00:00 4 mg via INTRAVENOUS

## 2021-08-09 MED ORDER — dextrose (D10W) 10 % bolus 125-250 mL
10 | INTRAVENOUS | Status: DC | PRN
Start: 2021-08-09 — End: 2021-08-14
  Administered 2021-08-10: 14:00:00 125 mL via INTRAVENOUS

## 2021-08-09 MED ORDER — glucagon injection 1 mg
1 | Freq: Once | INTRAMUSCULAR | Status: DC | PRN
Start: 2021-08-09 — End: 2021-08-09

## 2021-08-09 MED ORDER — ondansetron (Zofran) injection 4 mg
4 | Freq: Once | INTRAMUSCULAR | Status: AC
Start: 2021-08-09 — End: 2021-08-09
  Administered 2021-08-10: 03:00:00 4 mg via INTRAVENOUS

## 2021-08-09 MED ORDER — pantoprazole (ProtoNix) injection 40 mg
40 | Freq: Once | INTRAVENOUS | Status: AC
Start: 2021-08-09 — End: 2021-08-09
  Administered 2021-08-10: 03:00:00 40 mg via INTRAVENOUS

## 2021-08-09 MED ORDER — sodium chloride 0.9 % bolus 1,000 mL
0.9 | Freq: Once | INTRAVENOUS | Status: AC
Start: 2021-08-09 — End: 2021-08-09
  Administered 2021-08-10: 03:00:00 1000 mL via INTRAVENOUS

## 2021-08-09 MED ORDER — acetaminophen (Tylenol) tablet 650 mg
325 | Freq: Four times a day (QID) | ORAL | Status: DC | PRN
Start: 2021-08-09 — End: 2021-08-14

## 2021-08-09 MED ORDER — sodium chloride 0.9 % infusion
0.9 | INTRAVENOUS | Status: AC
Start: 2021-08-09 — End: 2021-08-10
  Administered 2021-08-10: 05:00:00 125 mL/h via INTRAVENOUS

## 2021-08-09 MED ORDER — dextrose 50 % in water (D50W) solution 25-50 mL
INTRAVENOUS | Status: DC | PRN
Start: 2021-08-09 — End: 2021-08-09

## 2021-08-09 MED ORDER — ondansetron ODT (Zofran-ODT) disintegrating tablet 4 mg
4 | Freq: Three times a day (TID) | ORAL | Status: DC | PRN
Start: 2021-08-09 — End: 2021-08-14

## 2021-08-09 MED ORDER — acetaminophen (Tylenol) suppository 650 mg
650 | Freq: Four times a day (QID) | RECTAL | Status: DC | PRN
Start: 2021-08-09 — End: 2021-08-14

## 2021-08-09 MED ORDER — sodium chloride 0.9 % infusion
0.9 | INTRAVENOUS | Status: DC
Start: 2021-08-09 — End: 2021-08-10
  Administered 2021-08-10: 06:00:00 200 mL/h via INTRAVENOUS

## 2021-08-09 MED ORDER — insulin regular in NS (HumuLIN R,NovoLIN R) 100 unit/100 mL (1 unit/mL) infusion
100 | INTRAVENOUS | Status: DC
Start: 2021-08-09 — End: 2021-08-09
  Administered 2021-08-10: 05:00:00 8.2 [IU]/h via INTRAVENOUS

## 2021-08-09 MED ORDER — D5 %-0.45 % sodium chloride infusion
INTRAVENOUS | Status: DC | PRN
Start: 2021-08-09 — End: 2021-08-10
  Administered 2021-08-10 – 2021-08-11 (×2): 200 mL/h via INTRAVENOUS

## 2021-08-09 MED ORDER — dextrose 50 % in water (D50W) solution 25-50 mL
INTRAVENOUS | Status: DC | PRN
Start: 2021-08-09 — End: 2021-08-14

## 2021-08-09 MED ORDER — glucagon injection 1 mg
1 | Freq: Once | INTRAMUSCULAR | Status: DC | PRN
Start: 2021-08-09 — End: 2021-08-14

## 2021-08-09 NOTE — ED Triage Notes (Signed)
BIBA from home. Pt visiting family since December, from [place]. Pt has had n/v x12 days, and has not checked his blood sugar or taken his medications in 12 days. CFD got CBS "high", and EMS got 387. Per EMS, family gave an unknown amount of insulin prior to departure.

## 2021-08-09 NOTE — ED Provider Notes (Signed)
 [facility] EMERGENCY DEPARTMENT MAIN CAMPUS  [address]  [place] [address]    PATIENT  Rodney Olson  DOB: 12/09/1955, MRN: [id number]    DATE OF SERVICE  08/09/2021  8:49 PM    CHIEF COMPLAINT  Rodney Olson is a 66 y.o. male with Hyperglycemia      HISTORY OF PRESENT ILLNESS  This is a 66 year old male brought in by his daughter for evaluation of fatigue, malaise, vomiting, and elevated blood sugar.  He has been visiting from [place] for the last 12 days or so now.  Has not taken really any of his medications.  Daughter states that he is fairly noncompliant overall and continues to smoke quite a bit.  Patient has a history of diabetes.  Tonight his blood sugar was noted to be quite elevated so he did take over 20 units of his Levemir prior to coming to the hospital.  He has had some nausea and vomiting and says that it is quite dark brown.  Has not had any fevers.  Denies any cough, shortness of breath, chest pain.  Says he is not really been making that much urine.  Has had weight loss.      History provided by:  Patient and relative (daughter)  History limited by:  Acuity of condition, age and mental status change  Language interpreter used: No        REVIEW OF SYSTEMS  Review of Systems   Unable to perform ROS: Acuity of condition   Constitutional: Positive for activity change, appetite change, fatigue and unexpected weight change. Negative for fever.   Respiratory: Negative for cough and shortness of breath.    Cardiovascular: Negative for chest pain and leg swelling.   Gastrointestinal: Positive for abdominal pain, nausea and vomiting. Negative for diarrhea.   Genitourinary: Negative for decreased urine volume.       PATIENT HISTORY  MEDICAL  Past Medical History:   Diagnosis Date   . Below knee amputation (CMS/HCC)     Left leg   . Diabetes mellitus (CMS/HCC)    . Melanoma (CMS/HCC)    . Prostate CA (CMS/HCC)        SURGICAL  Left leg amputation    MEDICATIONS  No current facility-administered medications on  file prior to encounter.     Current Outpatient Medications on File Prior to Encounter   Medication Sig   . atorvastatin (Lipitor) 40 mg tablet Take 40 mg by mouth in the morning.   . fenofibrate (Triglide) 160 mg tablet Take 160 mg by mouth in the morning.   . insulin detemir (Levemir) 100 unit/mL injection Inject 72 Units under the skin in the morning.   Marland Kitchen lisinopril 20 mg tablet Take 20 mg by mouth in the morning.   Marland Kitchen omeprazole (PriLOSEC) 40 mg DR capsule Take 40 mg by mouth before breakfast. Do not crush or chew.       ALLERGIES  No Known Allergies    FAMILY  Reviewed and non-contributory to patient's complaint today.     SOCIAL  Social History     Tobacco Use   . Smoking status: Every Day     Types: Cigarettes   . Smokeless tobacco: Never   Substance Use Topics   . Alcohol use: Never   . Drug use: Not on file       PHYSICAL EXAM  TRIAGE (FIRST SET) VITAL SIGNS  Temp: 36.4 C (97.5 F) Oral  Pulse: 114  BP: 91/61  Resp: 18  SpO2: 98 % Oxygen Therapy: None (Room air)  Glasgow Coma Scale Score: 15    Afebrile, mild tachycardia, soft blood pressure, with otherwise normal triage vital signs including normal O2 saturation on room air.  He was seen in B65 with his daughter present.    Physical Exam  Vitals and nursing note reviewed.   Constitutional:       Appearance: He is ill-appearing.      Comments: Slightly somnolent.  Appears much older than stated age.   HENT:      Head: Normocephalic and atraumatic.      Nose: No congestion.      Mouth/Throat:      Mouth: Mucous membranes are dry.   Eyes:      General: No visual field deficit.        Right eye: No discharge.         Left eye: No discharge.      Conjunctiva/sclera: Conjunctivae normal.   Cardiovascular:      Rate and Rhythm: Normal rate and regular rhythm.      Heart sounds: Normal heart sounds. No murmur heard.    No gallop.   Pulmonary:      Effort: Pulmonary effort is normal. No respiratory distress.      Breath sounds: Normal breath sounds. No  wheezing, rhonchi or rales.   Abdominal:      General: Abdomen is flat. Bowel sounds are normal. There is no distension.      Palpations: Abdomen is soft.      Tenderness: There is abdominal tenderness (moderate epigastric). There is no guarding or rebound.   Musculoskeletal:         General: Normal range of motion.      Cervical back: Normal range of motion and neck supple.   Skin:     General: Skin is warm and dry.      Capillary Refill: Capillary refill takes 2 to 3 seconds.      Findings: No rash.   Neurological:      General: No focal deficit present.      Cranial Nerves: Cranial nerves 2-12 are intact. No cranial nerve deficit or dysarthria.      Motor: Weakness present.      Coordination: Coordination is intact.      Comments: Overall somewhat somnolent although easily arousable.  Answering questions appropriately.  Oriented x3.  Quite deconditioned with 4/5 strength in the upper extremities bilaterally and in the right lower extremity.  He has a prosthesis for the left leg.  Sensation appears to be intact as well.   Psychiatric:         Mood and Affect: Affect is flat.         RESULTS  Abnormal Labs Reviewed   COMPREHENSIVE METABOLIC PANEL - Abnormal; Notable for the following components:       Result Value    Sodium 130 (*)     Chloride 76 (*)     Anion Gap 25 (*)     BUN 90 (*)     Creatinine 3.91 (*)     eGFRcr 16 (*)     Glucose 924 (*)     Alkaline phosphatase 145 (*)     Protein, total 8.7 (*)     All other components within normal limits   CBC WITH DIFFERENTIAL - WAM AND NON-WAM - Abnormal; Notable for the following components:    WBC 16.7 (*)     All other components within normal  limits   LIPASE - Abnormal; Notable for the following components:    Lipase 1,186 (*)     All other components within normal limits    Narrative:     Reference Range updated as 07/17/2021   MAGNESIUM - Abnormal; Notable for the following components:    Magnesium 3.5 (*)     All other components within normal limits   BETA  HYDROXYBUTYRATE - Abnormal; Notable for the following components:    Beta Hydroxybutyrate >4.50 (*)     All other components within normal limits   BLOOD GAS, VENOUS - Abnormal; Notable for the following components:    pCO2 52 (*)     O2 Sat (Estimated) 92 (*)     All other components within normal limits   URINALYSIS REFLEX TO CULTURE - Abnormal; Notable for the following components:    Protein,Ur 100 (*)     Glucose,Ur >=1000 (*)     Ketones, Ur Trace (*)     Casts, Ur 7 (*)     All other components within normal limits   TRIGLYCERIDES - Abnormal; Notable for the following components:    Triglycerides 226 (*)     All other components within normal limits   POCT GLUCOSE - Abnormal; Notable for the following components:    POCT Glucose >600 (*)     All other components within normal limits   POCT GLUCOSE - Abnormal; Notable for the following components:    POCT Glucose 533 (*)     All other components within normal limits   POCT GLUCOSE - Abnormal; Notable for the following components:    POCT Glucose 485 (*)     All other components within normal limits   POCT GLUCOSE - Abnormal; Notable for the following components:    POCT Glucose 412 (*)     All other components within normal limits   POCT GLUCOSE - Abnormal; Notable for the following components:    POCT Glucose 328 (*)     All other components within normal limits   MANUAL DIFFERENTIAL - Abnormal; Notable for the following components:    Neutrophils Absolute 14.20 (*)     Monocytes Absolute 0.84 (*)     All other components within normal limits     CT ABDOMEN PELVIS WO CONTRAST   Final Result   1. No CT evidence of pancreatitis.   2. Cholelithiasis.   3. Diverticulosis.   4. Right renal cyst.   5. Probable prior prostatectomy.   6. Atherosclerosis, infrarenal AAA.      Slobodan Miseljic, MD 08/10/2021 1:03 AM      XR CHEST 2 VIEWS   Final Result   1. No acute pathology.      Slobodan Miseljic, MD 08/10/2021 12:27 AM          EKG  Sinus tachycardia at a rate of  107 bpm without acute ischemic ST-T changes.  This was reviewed and interpreted by myself.       CARDIAC MONITORING  Sinus tachycardia improving    PROCEDURES  Critical Care  Performed by: Alvino Chapel, MD  Authorized by: Alvino Chapel, MD     Critical care provider statement:     Critical care time (minutes):  45    Critical care was necessary to treat or prevent imminent or life-threatening deterioration of the following conditions:  Cardiac failure, circulatory failure, renal failure, respiratory failure and metabolic crisis    Critical care was time spent personally by me on the  following activities:  Evaluation of patient's response to treatment, examination of patient, development of treatment plan with patient or surrogate, ordering and performing treatments and interventions, ordering and review of laboratory studies, ordering and review of radiographic studies, pulse oximetry and re-evaluation of patient's condition    I assumed direction of critical care for this patient from another provider in my specialty: no      Care discussed with: admitting provider          ED COURSE/MEDICAL DECISION MAKING  ED Medication Administration from 08/09/2021 2043 to 08/10/2021 0122       Date/Time Order Dose Route Action Action by     08/09/2021 2206 EST ondansetron (Zofran) injection 4 mg 4 mg intravenous Given Nasuti, S     08/09/2021 2206 EST pantoprazole (ProtoNix) injection 40 mg 40 mg intravenous Given Nasuti, S     08/09/2021 2206 EST sodium chloride 0.9 % bolus 1,000 mL 1,000 mL intravenous New Bag Nasuti, S     08/09/2021 2300 EST sodium chloride 0.9 % infusion 0 mL/hr intravenous Stopped Nasuti, S     08/09/2021 2306 EST sodium chloride 0.9 % bolus 1,000 mL 0 mL intravenous Stopped Nasuti, S     08/09/2021 2343 EST insulin regular in NS (HumuLIN R,NovoLIN R) 100 unit/100 mL (1 unit/mL) infusion 8.2 Units/hr intravenous New Bag Nasuti, S     08/09/2021 2343 EST sodium chloride 0.9 % infusion 125 mL/hr intravenous New Bag  Nasuti, S     08/09/2021 2351 EST morphine injection 4 mg 4 mg intravenous Given Nasuti, S     08/09/2021 2355 EST heparin (porcine) injection 5,000 Units 5,000 Units subcutaneous Not Given Nasuti, S     08/10/2021 0033 EST insulin regular in NS (HumuLIN R,NovoLIN R) 100 unit/100 mL (1 unit/mL) infusion 0 Units/hr intravenous Stopped Nasuti, S     08/10/2021 0043 EST sodium chloride 0.9 % infusion 200 mL/hr intravenous New Bag Nasuti, S     08/10/2021 0044 EST insulin regular in NS (HumuLIN R,NovoLIN R) 100 unit/100 mL (1 unit/mL) infusion 8.2 Units/hr intravenous New Bag Nasuti, S     08/10/2021 0047 EST sodium chloride 0.9 % infusion 0 mL/hr intravenous Stopped Nasuti, S     08/10/2021 0054 EST insulin regular in NS (HumuLIN R,NovoLIN R) 100 unit/100 mL (1 unit/mL) infusion 4.1 Units/hr intravenous Rate/Dose Change Nasuti, S        ED Course as of 08/10/21 0459   Thu Aug 09, 2021   2224 Labs have been reviewed.  CT scan has been ordered given the renal failure and pancreatitis.   2300 Patient with some marginal improvements.  Awaiting imaging studies.  Insulin drip has been ordered.  Plan will be for admission.  Will call ICU once I have the CT scan results.   2354 Given paucity of availability of ICU beds, I reviewed the history, exam, and results were reviewed with Dr. Sherre Poot (ICU). Agrees with admission. Aware CT Pending.         Diagnoses as of 08/10/21 0459   DKA, type 2, not at goal (CMS/HCC)   Acute pancreatitis, unspecified complication status, unspecified pancreatitis type   Acute renal failure, unspecified acute renal failure type (CMS/HCC)     Medical Decision Making  Acute pancreatitis, unspecified complication status, unspecified pancreatitis type: acute illness or injury  Acute renal failure, unspecified acute renal failure type (CMS/HCC): acute illness or injury  DKA, type 2, not at goal (CMS/HCC): acute illness or injury  Amount and/or Complexity  of Data Reviewed  Independent Historian:       Details: Daughter  Labs: ordered.  Radiology: ordered.  ECG/medicine tests: ordered and independent interpretation performed.      Risk  Decision regarding hospitalization.           Alvino Chapel, MD  08/10/21 [phone]

## 2021-08-10 ENCOUNTER — Encounter

## 2021-08-10 ENCOUNTER — Inpatient Hospital Stay: Admit: 2021-08-10 | Payer: MEDICARE

## 2021-08-10 ENCOUNTER — Inpatient Hospital Stay
Admission: EM | Admit: 2021-08-10 | Discharge: 2021-08-14 | Disposition: A | Discharge status: Home / Self Care | Payer: MEDICARE | Admitting: Internal Medicine

## 2021-08-10 LAB — CBC WITH DIFFERENTIAL
Basophils %: 0.2 %
Basophils Absolute: 0.03 10*3/uL (ref 0.00–0.22)
Eosinophils %: 0.1 %
Eosinophils Absolute: 0.01 10*3/uL (ref 0.00–0.50)
Hematocrit: 42.4 % (ref 37.0–53.0)
Hemoglobin: 14.7 g/dL (ref 13.0–17.5)
Immature Granulocytes %: 0.6 %
Immature Granulocytes Absolute: 0.1 10*3/uL (ref 0.00–0.10)
Lymphocyte %: 5.9 %
Lymphocytes Absolute: 1.02 10*3/uL (ref 0.70–4.00)
MCH: 28.1 pg (ref 26.0–34.0)
MCHC: 34.7 g/dL (ref 31.0–37.0)
MCV: 80.9 fL (ref 80.0–100.0)
MPV: 10.2 fL (ref 9.1–12.4)
Monocytes %: 3.9 %
Monocytes Absolute: 0.67 10*3/uL (ref 0.38–0.83)
NRBC %: 0 % (ref 0.0–0.0)
NRBC Absolute: 0 10*3/uL (ref 0.00–2.00)
Neutrophil %: 89.3 %
Neutrophils Absolute: 15.49 10*3/uL — ABNORMAL HIGH (ref 1.50–7.95)
Platelets: 253 10*3/uL (ref 150–400)
RBC: 5.24 M/uL (ref 4.20–5.90)
RDW-CV: 13.6 % (ref 11.5–14.5)
RDW-SD: 39.4 fL (ref 35.0–51.0)
WBC: 17.3 10*3/uL — ABNORMAL HIGH (ref 4.0–11.0)

## 2021-08-10 LAB — URINALYSIS REFLEX TO CULTURE
Bacteria, Ur: NONE SEEN /HPF
Bilirubin, Ur: NEGATIVE
Blood, Ur: NEGATIVE
Casts, Ur: 7 /LPF — ABNORMAL HIGH (ref 0–4)
Epithelial Cells, UR: 1 {cells}/[HPF] (ref 0–5)
Glucose,Ur: >=1000 mg/dL — AB
Leukocyte Esterase, Ur: NEGATIVE WBC/uL
Nitrite, Ur: NEGATIVE
Protein,Ur: 100 mg/dL — AB
RBC, Ur: 1 /HPF (ref 0–4)
Specific Gravity, Ur: >=1.03 (ref 1.005–1.030)
Urobilinogen, Ur: 0.2 (ref 0.2–1.0)
WBC, Ur: 1 /HPF (ref 0–5)
pH, Ur: 5 (ref 5.0–8.0)

## 2021-08-10 LAB — POCT GLUCOSE
POCT Glucose: 107 mg/dL (ref 70–110)
POCT Glucose: 138 mg/dL — ABNORMAL HIGH (ref 70–110)
POCT Glucose: 151 mg/dL — ABNORMAL HIGH (ref 70–110)
POCT Glucose: 153 mg/dL — ABNORMAL HIGH (ref 70–110)
POCT Glucose: 157 mg/dL — ABNORMAL HIGH (ref 70–110)
POCT Glucose: 172 mg/dL — ABNORMAL HIGH (ref 70–110)
POCT Glucose: 186 mg/dL — ABNORMAL HIGH (ref 70–110)
POCT Glucose: 226 mg/dL — ABNORMAL HIGH (ref 70–110)
POCT Glucose: 228 mg/dL — ABNORMAL HIGH (ref 70–110)
POCT Glucose: 237 mg/dL — ABNORMAL HIGH (ref 70–110)
POCT Glucose: 243 mg/dL — ABNORMAL HIGH (ref 70–110)
POCT Glucose: 245 mg/dL — ABNORMAL HIGH (ref 70–110)
POCT Glucose: 254 mg/dL — ABNORMAL HIGH (ref 70–110)
POCT Glucose: 261 mg/dL — ABNORMAL HIGH (ref 70–110)
POCT Glucose: 265 mg/dL — ABNORMAL HIGH (ref 70–110)
POCT Glucose: 292 mg/dL — ABNORMAL HIGH (ref 70–110)
POCT Glucose: 328 mg/dL — ABNORMAL HIGH (ref 70–110)
POCT Glucose: 412 mg/dL — ABNORMAL HIGH (ref 70–110)
POCT Glucose: 485 mg/dL (ref 70–110)
POCT Glucose: 533 mg/dL (ref 70–110)
POCT Glucose: 75 mg/dL (ref 70–110)

## 2021-08-10 LAB — BASIC METABOLIC PANEL
Anion Gap: 12 mmol/L (ref 3–14)
Anion Gap: 8 mmol/L (ref 3–14)
Anion Gap: 7 mmol/L (ref 3–14)
Anion Gap: 3 mmol/L (ref 3–14)
BUN: 91 mg/dL — ABNORMAL HIGH (ref 6–24)
BUN: 87 mg/dL — ABNORMAL HIGH (ref 6–24)
BUN: 74 mg/dL — ABNORMAL HIGH (ref 6–24)
BUN: 71 mg/dL — ABNORMAL HIGH (ref 6–24)
CO2 (Bicarbonate): 29 mmol/L (ref 20–32)
CO2 (Bicarbonate): 34 mmol/L — ABNORMAL HIGH (ref 20–32)
CO2 (Bicarbonate): 32 mmol/L (ref 20–32)
CO2 (Bicarbonate): 32 mmol/L (ref 20–32)
Calcium: 9.1 mg/dL (ref 8.5–10.5)
Calcium: 8.5 mg/dL (ref 8.5–10.5)
Calcium: 8.3 mg/dL — ABNORMAL LOW (ref 8.5–10.5)
Calcium: 8.1 mg/dL — ABNORMAL LOW (ref 8.5–10.5)
Chloride: 98 mmol/L (ref 98–110)
Chloride: 100 mmol/L (ref 98–110)
Chloride: 101 mmol/L (ref 98–110)
Chloride: 102 mmol/L (ref 98–110)
Creatinine: 3.29 mg/dL — ABNORMAL HIGH (ref 0.55–1.30)
Creatinine: 2.82 mg/dL — ABNORMAL HIGH (ref 0.55–1.30)
Creatinine: 2.56 mg/dL — ABNORMAL HIGH (ref 0.55–1.30)
Creatinine: 2.36 mg/dL — ABNORMAL HIGH (ref 0.55–1.30)
Glucose: 330 mg/dL — ABNORMAL HIGH (ref 70–110)
Glucose: 155 mg/dL — ABNORMAL HIGH (ref 70–110)
Glucose: 198 mg/dL — ABNORMAL HIGH (ref 70–110)
Glucose: 244 mg/dL — ABNORMAL HIGH (ref 70–110)
Potassium: 3.9 mmol/L (ref 3.6–5.2)
Potassium: 3.9 mmol/L (ref 3.6–5.2)
Potassium: 3.8 mmol/L (ref 3.6–5.2)
Potassium: 4.2 mmol/L (ref 3.6–5.2)
Sodium: 139 mmol/L (ref 135–146)
Sodium: 142 mmol/L (ref 135–146)
Sodium: 140 mmol/L (ref 135–146)
Sodium: 137 mmol/L (ref 135–146)
eGFRcr: 20 mL/min/{1.73_m2} — ABNORMAL LOW (ref >=60)
eGFRcr: 24 mL/min/{1.73_m2} — ABNORMAL LOW (ref >=60)
eGFRcr: 27 mL/min/{1.73_m2} — ABNORMAL LOW (ref >=60)
eGFRcr: 29 mL/min/{1.73_m2} — ABNORMAL LOW (ref >=60)

## 2021-08-10 LAB — PROCALCITONIN: Procalcitonin: 0.39 ng/mL

## 2021-08-10 LAB — BLOOD GAS, VENOUS: Bicarbonate (Total CO2): 28 mmol/L (ref 20–32)

## 2021-08-10 LAB — PHOSPHORUS
Phosphorus: 5.1 mg/dL — ABNORMAL HIGH (ref 2.4–4.9)
Phosphorus: 4.4 mg/dL (ref 2.4–4.9)
Phosphorus: 3.1 mg/dL (ref 2.4–4.9)
Phosphorus: 2.6 mg/dL (ref 2.4–4.9)

## 2021-08-10 LAB — MAGNESIUM
Magnesium: 3 mg/dL — ABNORMAL HIGH (ref 1.6–2.6)
Magnesium: 2.7 mg/dL — ABNORMAL HIGH (ref 1.6–2.6)
Magnesium: 2.5 mg/dL (ref 1.6–2.6)
Magnesium: 2.4 mg/dL (ref 1.6–2.6)

## 2021-08-10 LAB — LACTIC ACID
Lactic acid: 4 mmol/L (ref 0.4–2.0)
Lactic acid: 2.7 mmol/L (ref 0.4–2.0)

## 2021-08-10 LAB — TRIGLYCERIDES: Triglycerides: 171 mg/dL — ABNORMAL HIGH (ref <=150)

## 2021-08-10 MED ORDER — potassium chloride ER (Micro-K) ER capsule 20 mEq
10 | Freq: Once | ORAL | Status: AC
Start: 2021-08-10 — End: 2021-08-10
  Administered 2021-08-10: 23:00:00 20 meq via ORAL

## 2021-08-10 MED ORDER — D5 %-0.45 % sodium chloride infusion
INTRAVENOUS | Status: DC
Start: 2021-08-10 — End: 2021-08-11

## 2021-08-10 MED ORDER — phenoL (Chloraseptic) 1.4 % mouth/throat spray 1 spray
1.4 | Status: DC | PRN
Start: 2021-08-10 — End: 2021-08-14
  Administered 2021-08-11 (×2): 1 via OROMUCOSAL

## 2021-08-10 MED ORDER — potassium chloride liquid 20 mEq
20 | Freq: Once | ORAL | Status: AC
Start: 2021-08-10 — End: 2021-08-10
  Administered 2021-08-10: 20:00:00 20 meq via ORAL

## 2021-08-10 MED ORDER — ampicillin-sulbactam (Unasyn) 3 g in sodium chloride 0.9 % 100 mL IVPB
3 | Freq: Two times a day (BID) | INTRAMUSCULAR | Status: DC
Start: 2021-08-10 — End: 2021-08-10
  Administered 2021-08-10: 11:00:00 3 g via INTRAVENOUS

## 2021-08-10 MED ORDER — metoclopramide (Reglan) injection 10 mg
5 | Freq: Three times a day (TID) | INTRAMUSCULAR | Status: DC | PRN
Start: 2021-08-10 — End: 2021-08-14
  Administered 2021-08-10: 19:00:00 10 mg via INTRAVENOUS

## 2021-08-10 MED ORDER — insulin lispro (HumaLOG) injection 0-10 Units
100 | Freq: Four times a day (QID) | SUBCUTANEOUS | Status: DC
Start: 2021-08-10 — End: 2021-08-14
  Administered 2021-08-11: 17:00:00 6 [IU] via SUBCUTANEOUS
  Administered 2021-08-11: 23:00:00 4 [IU] via SUBCUTANEOUS
  Administered 2021-08-12: 17:00:00 2 [IU] via SUBCUTANEOUS
  Administered 2021-08-12: 03:00:00 4 [IU] via SUBCUTANEOUS
  Administered 2021-08-12: 13:00:00 2 [IU] via SUBCUTANEOUS
  Administered 2021-08-13: 23:00:00 6 [IU] via SUBCUTANEOUS
  Administered 2021-08-14 (×3): 2 [IU] via SUBCUTANEOUS

## 2021-08-10 MED ORDER — pantoprazole (ProtoNix) EC tablet 40 mg
40 | Freq: Every evening | ORAL | Status: DC
Start: 2021-08-10 — End: 2021-08-14
  Administered 2021-08-11 – 2021-08-14 (×4): 40 mg via ORAL

## 2021-08-10 MED ORDER — glucagon injection 1 mg
1 | INTRAMUSCULAR | Status: DC | PRN
Start: 2021-08-10 — End: 2021-08-14

## 2021-08-10 MED ORDER — insulin glargine (Lantus) injection 72 Units
100 | Freq: Every morning | SUBCUTANEOUS | Status: DC
Start: 2021-08-10 — End: 2021-08-11

## 2021-08-10 MED ORDER — lactated Ringer's bolus 500 mL
Freq: Once | INTRAVENOUS | Status: AC
Start: 2021-08-10 — End: 2021-08-10
  Administered 2021-08-10: 20:00:00 500 mL via INTRAVENOUS

## 2021-08-10 MED ORDER — lactated Ringer's bolus 500 mL
Freq: Once | INTRAVENOUS | Status: AC
Start: 2021-08-10 — End: 2021-08-10
  Administered 2021-08-10: 14:00:00 500 mL via INTRAVENOUS

## 2021-08-10 MED ORDER — potassium chloride IVPB 10 mEq
10 | INTRAVENOUS | Status: AC
Start: 2021-08-10 — End: 2021-08-10
  Administered 2021-08-10 (×2): 10 meq via INTRAVENOUS

## 2021-08-10 MED ORDER — atorvastatin (Lipitor) tablet 40 mg
40 | Freq: Every evening | ORAL | Status: DC
Start: 2021-08-10 — End: 2021-08-14
  Administered 2021-08-11 – 2021-08-14 (×4): 40 mg via ORAL

## 2021-08-10 MED ORDER — pantoprazole (ProtoNix) EC tablet 40 mg
40 | Freq: Every day | ORAL | Status: DC
Start: 2021-08-10 — End: 2021-08-10

## 2021-08-10 MED FILL — POTASSIUM CHLORIDE 10 MEQ/100ML IN STERILE WATER INTRAVENOUS PIGGYBACK: 10 10 mEq/100 mL | INTRAVENOUS | Qty: 100

## 2021-08-10 MED FILL — PANTOPRAZOLE 40 MG INTRAVENOUS SOLUTION: 40 40 mg | INTRAVENOUS | Qty: 40

## 2021-08-10 MED FILL — ONDANSETRON HCL (PF) 4 MG/2 ML INJECTION SOLUTION: 4 4 mg/2 mL | INTRAMUSCULAR | Qty: 2

## 2021-08-10 MED FILL — AMPICILLIN-SULBACTAM IVPB 3 G MINI-BAG PLUS: 3 3.0000 g | INTRAMUSCULAR | Qty: 3

## 2021-08-10 MED FILL — INSULIN GLARGINE (U-100) 100 UNIT/ML SUBCUTANEOUS SOLUTION: 100 100 unit/mL | SUBCUTANEOUS | Qty: 72

## 2021-08-10 MED FILL — MORPHINE 4 MG/ML INTRAVENOUS SOLUTION WRAPPER: 4 4 mg/mL | INTRAVENOUS | Qty: 1

## 2021-08-10 MED FILL — DEXTROSE 10 % IV BOLUS: 10 10 % | INTRAVENOUS | Qty: 250

## 2021-08-10 MED FILL — INSULIN REGULAR 100 UNIT/100 ML (1 UNIT/ML) IN 0.9 % NACL IV SOLUTION: 100 100 unit/100 mL (1 unit/mL) | INTRAVENOUS | Qty: 1

## 2021-08-10 MED FILL — POTASSIUM CHLORIDE ER 10 MEQ CAPSULE,EXTENDED RELEASE: 10 10 mEq | ORAL | Qty: 2

## 2021-08-10 MED FILL — HEPARIN (PORCINE) 5,000 UNIT/ML INJECTION SOLUTION: 5000 5,000 unit/mL | INTRAMUSCULAR | Qty: 1

## 2021-08-10 MED FILL — METOCLOPRAMIDE 5 MG/ML INJECTION SOLUTION: 5 5 mg/mL | INTRAMUSCULAR | Qty: 2

## 2021-08-10 MED FILL — ACETAMINOPHEN 325 MG/10.15 ML ORAL SOLUTION: 325 325 mg/10.15 mL | ORAL | Qty: 20.3

## 2021-08-10 MED FILL — POTASSIUM CHLORIDE 20 MEQ/15 ML ORAL LIQUID: 20 20 mEq/15 mL | ORAL | Qty: 15

## 2021-08-10 MED FILL — DEXTROSE 50 % IN WATER (D50W) INTRAVENOUS SYRINGE: 25.0000 25.0000 mL | INTRAVENOUS | Qty: 50

## 2021-08-10 NOTE — Unmapped (Incomplete)
I have personally examined the patient, reviewed the relevant data and discussed the patient with PA Tresa Endo and the multidisciplinary ICU team. I have reviewed and agree with the exam, data, assessment and plan as outlined in the below note.    46M with PMH s/f IDDM2, HTN, HLP, who presented with DKA 2/2 insulin nonadherence in setting of travel. Patient was fluid resuscitated and started on insulin gtt and admitted to MICU for further management.     Temp:  [36.3 C (97.4 F)-36.7 C (98 F)] 36.7 C (98 F)  Pulse:  [88-114] 91  Resp:  [8-21] 8  BP: (80-108)/(54-65) 99/59  SpO2 >94% RA      Intake/Output Summary (Last 24 hours) at 08/10/2021 0929  Last data filed at 08/10/2021 0915  Gross per 24 hour   Intake 2986.69 ml   Output 500 ml   Net 2486.69 ml     D5 %-0.45 % sodium chloride, 200 mL/hr, Last Rate: 200 mL/hr (08/10/21 0556)  insulin regular, 0-20 Units/hr, Last Rate: 2.56 Units/hr (08/10/21 0808)  sodium chloride, 200 mL/hr, Last Rate: Stopped (08/10/21 0556)      Exam notable for patient awake and alert, in NAD, lungs CTAB, cardiac RRR, abd soft NTND, ext [facility]    Labs, imaging and medications reviewed.   WBC 17.3, Lactate 4.0, Cr 3.29 from 3.9, AG 12  CT Abd/Pelvis revealed cholelithiasis, no evidence of pancreatitis.   RUQUS pending.    Problems:  DKA  AKI  Leukocytosis     Plan:  - Continue acetaminophen PRN pain  - Continue IVF and insulin gtt per DKA protocol  - Hold antihypertensives  - Restart statin, hold fenofibrate pending renal function  - Continue to monitor respiratory status, oxygenating well on RA  - Noted to have elevated lipase, however no evidence of pancreatitis on CT imaging, will continue to monitor clinically  - Monitor renal function and electrolytes, suspect AKI is pre-renal in setting of severe DKA with polyuria and emesis  - Elevated WBC likely in setting of hemoconcentration and stress response in setting of DKA, however concerning for possible infection given cholelithiasis.  Continue Unasyn for now. RUQUS pending.  - Follow-up BCx, UA/UCx  - Rest of plan as detailed in PA note.  FULL CODE    CCT    Deliah Goody, MD  Pulmonary & Critical Care Medicine Attending

## 2021-08-10 NOTE — Care Plan (Signed)
Problem: Adult Inpatient Plan of Care  Goal: Plan of Care Review  Flowsheets (Taken 08/10/2021 2231)  Progress: improving  Plan of Care Reviewed With: patient  Outcome Evaluation: Bloodsugar improved and anion gap closed x2 however pt unable to tolerate diet due to nausea/vomiting, throat and epigastric pain after PO intake. Therefore unable to transition off insulin gtt to subq. Per provider, continue insulin gtt and dka protocol until pt is able to eat. zofran, reglan, protonix and throat spray ordered. will continue to monitor and follow protocol as indicated.  Goal: Patient-Specific Goal (Individualized)  Outcome: Ongoing, Progressing  Goal: Absence of Hospital-Acquired Illness or Injury  Outcome: Ongoing, Progressing  Intervention: Identify and Manage Fall Risk  Flowsheets (Taken 08/10/2021 1940)  Safety Promotion/Fall Prevention:   assistive device/personal items within reach   clutter-free environment maintained   lighting adjusted   room organization consistent   safety round/check completed  Intervention: Prevent Skin Injury  Flowsheets  Taken 08/10/2021 2200  Skin Protection:   adhesive use limited   incontinence pads utilized   protective footwear used   pulse oximeter probe site changed   silicone foam dressing in place   skin-to-device areas padded   skin-to-skin areas padded   transparent dressing maintained   tubing/devices free from skin contact  Taken 08/10/2021 1940  Body Position: position changed independently  Intervention: Prevent and Manage VTE (Venous Thromboembolism) Risk  Flowsheets  Taken 08/10/2021 2200  Range of Motion: active ROM (range of motion) encouraged  VTE Prevention/Management:   foot pump device on   SCDs (sequential compression devices) on  Taken 08/10/2021 1940  Activity Management: bedrest  Intervention: Prevent Infection  Flowsheets (Taken 08/10/2021 1940)  Infection Prevention:   equipment surfaces disinfected   hand hygiene promoted   personal protective equipment utilized    rest/sleep promoted   single patient room provided   visitors restricted/screened  Goal: Optimal Comfort and Wellbeing  Outcome: Ongoing, Progressing  Intervention: Monitor Pain and Promote Comfort  Flowsheets (Taken 08/10/2021 2200)  Pain Management Interventions:   care clustered   medication offered   pain management plan reviewed with patient/caregiver   position adjusted   quiet environment facilitated  Intervention: Provide Person-Centered Care  Flowsheets (Taken 08/10/2021 2200)  Trust Relationship/Rapport:   care explained   choices provided   emotional support provided   empathic listening provided   questions answered   questions encouraged   reassurance provided   thoughts/feelings acknowledged  Goal: Readiness for Transition of Care  Outcome: Ongoing, Not Progressing     Problem: Diabetic Ketoacidosis  Goal: Fluid and Electrolyte Balance with Absence of Ketosis  Outcome: Ongoing, Progressing  Intervention: Monitor and Manage Ketoacidosis  Flowsheets (Taken 08/10/2021 1940)  Glycemic Management:   blood glucose monitored   insulin infusion adjusted   insulin infusion initiated  Fluid/Electrolyte Management:   electrolyte supplement adjusted   electrolyte supplement initiated   intravenous fluid replacement initiated   fluids provided   Goal Outcome Evaluation:   Blood sugar improved and anion gap closed x2 however pt unable to tolerate diet due to nausea/vomiting, throat and epigastric pain after PO intake. Therefore unable to transition off insulin gtt to subq. Per provider, continue insulin gtt and dka protocol until pt is able to eat. zofran, reglan, protonix and throat spray ordered. Pt AOx4, all vitals WNL, foley in place with adequate output. Will continue to monitor and follow dka protocol as indicated- Q6hr bmp.  Problem: Adult Inpatient Plan of Care  Goal: Readiness for Transition of Care  Outcome: Ongoing, Not Progressing
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
## 2021-08-10 NOTE — Progress Notes (Signed)
ICU Progress Note    24hr events:    Admitted overnight due to DKA and AKI in setting of medication non-compliance  Now asking for food  Remains on insulin gtt    VS:  Temp:  [36.3 C (97.4 F)-36.7 C (98 F)] 36.7 C (98 F)  Pulse:  [85-114] 86  Resp:  [8-21] 9  BP: (80-125)/(54-68) 125/68    24hr I/Os    Intake/Output Summary (Last 24 hours) at 08/10/2021 1202  Last data filed at 08/10/2021 1100  Gross per 24 hour   Intake 3887.69 ml   Output 575 ml   Net 3312.69 ml       Medications  Infusions  D5 %-0.45 % sodium chloride, 200 mL/hr, Last Rate: 200 mL/hr (08/10/21 0556)  insulin regular, 0-20 Units/hr, Last Rate: 0.25 Units/hr (08/10/21 1110)  sodium chloride, 200 mL/hr, Last Rate: Stopped (08/10/21 0556)        Scheduled  ampicillin-sulbactam, 3 g, intravenous, q12h  atorvastatin, 40 mg, oral, Nightly  heparin (porcine), 5,000 Units, subcutaneous, q8h [facility]  [START ON 08/11/2021] pantoprazole, 40 mg, oral, Daily before breakfast        Physical Exam  General: Well appearing older man seated upright in bed, NAD  HEENT: NCAT  CV: RRR  Pulm: CTA  GI: soft, mild generalized tenderness, non-distended  Integ: warm and dry  Neuro: A&Ox3, no focal deficits    Labs:  Lab Results   Component Value Date    WBC 17.3 (H) 08/10/2021    HGB 14.7 08/10/2021    MCV 80.9 08/10/2021    PLT 253 08/10/2021     Lab Results   Component Value Date    GLUCOSE 330 (H) 08/10/2021    CALCIUM 9.1 08/10/2021    NA 139 08/10/2021    K 3.9 08/10/2021    CO2 29 08/10/2021    CL 98 08/10/2021    BUN 91 (H) 08/10/2021    CREATININE 3.29 (H) 08/10/2021     Lab Results   Component Value Date    ALT 17 08/09/2021    AST 6 08/09/2021    ALKPHOS 145 (H) 08/09/2021    BILITOT 0.7 08/09/2021       Radiology:  === 08/09/21 ===    XR CHEST 1 VIEW    - Impression -  No evidence of an acute cardiopulmonary process.    Hendricks Limes, MD 08/10/2021 6:49 AM      Problem List:  Principal Problem:    DKA, type 2, not at goal (CMS/HCC)      Assessment & Plan:  Patient  is a 66 year old man with PMH including IDDM, HTN, HLP, who is visiting from [facility] and essentially hasn't used his insulin in the past 10 days. Developed nausea, vomiting and abdominal discomfort which led to him presenting to ED. Found to be in DKA and probable AKI (baseline Cr unknown). In ICU for continued care.    Neuro:   - No active issues  - Tylenol available for pain if needed    CV:   - BP soft but stable, likely still dry, plan to bolus additional 500cc LR  - holding home AceI  - will resume home statin and continue to hold fenofibrate given AKI  - EKG with sinus tach    Pulm:   - no active issues  - CXR without acute cardiopulmonary process    GI:   - nausea/vomiting in setting of DKA  - elevated lipase without evidence of  pancreatitis on CT, suspect DKA related  - CT showed cholelithiasis, given leukocytosis, abdominal U/S was ordered to best assess for possible cholecystitis - will f/u results  - NPO for now    Renal:   - suspect AKI although baseline Cr unknown, likely prerenal/dehydration related in setting of vomiting/poor PO intake, continues to make urine, Cr downtrending  - plan to provided additional fluid bolus  - trend lactate  - BMP Q4 while on insulin gtt  - replete potassium    ID:   - afebrile, +leukocytosis (suspect stress response in setting of DKA/vomiting)  - UA negative  - CXR w/out acute findings and no respiratory symptoms  - started on Unasyn for possible cholecystitis, will d/c if U/S does not suggest acute cholecystitis     Endocrine/BG:   - DKA in setting of medication non-compliance  - takes 72 units detemir at home, plan to resume home regimen once AG closed x 2      ICU BUNDLE:  Access: PIV  Diet: NPO  Ppx: [facility] (patient refusing)  Code status: Full  Dispo: ICU care for now    [employer], PA-C

## 2021-08-11 LAB — POCT GLUCOSE
POCT Glucose: 163 mg/dL — ABNORMAL HIGH (ref 70–110)
POCT Glucose: 164 mg/dL — ABNORMAL HIGH (ref 70–110)
POCT Glucose: 213 mg/dL — ABNORMAL HIGH (ref 70–110)
POCT Glucose: 218 mg/dL — ABNORMAL HIGH (ref 70–110)
POCT Glucose: 220 mg/dL — ABNORMAL HIGH (ref 70–110)
POCT Glucose: 228 mg/dL — ABNORMAL HIGH (ref 70–110)
POCT Glucose: 238 mg/dL — ABNORMAL HIGH (ref 70–110)
POCT Glucose: 256 mg/dL — ABNORMAL HIGH (ref 70–110)
POCT Glucose: 259 mg/dL — ABNORMAL HIGH (ref 70–110)
POCT Glucose: 260 mg/dL — ABNORMAL HIGH (ref 70–110)
POCT Glucose: 261 mg/dL — ABNORMAL HIGH (ref 70–110)
POCT Glucose: 262 mg/dL — ABNORMAL HIGH (ref 70–110)
POCT Glucose: 264 mg/dL — ABNORMAL HIGH (ref 70–110)
POCT Glucose: 265 mg/dL — ABNORMAL HIGH (ref 70–110)

## 2021-08-11 LAB — CBC WITH DIFFERENTIAL
Basophils %: 0.3 %
Basophils Absolute: 0.04 10*3/uL (ref 0.00–0.22)
Eosinophils %: 0.8 %
Eosinophils Absolute: 0.11 10*3/uL (ref 0.00–0.50)
Hematocrit: 34 % — ABNORMAL LOW (ref 37.0–53.0)
Hemoglobin: 11.4 g/dL — ABNORMAL LOW (ref 13.0–17.5)
Immature Granulocytes %: 0.5 %
Immature Granulocytes Absolute: 0.06 10*3/uL (ref 0.00–0.10)
Lymphocyte %: 12 %
Lymphocytes Absolute: 1.56 10*3/uL (ref 0.70–4.00)
MCH: 28.1 pg (ref 26.0–34.0)
MCHC: 33.5 g/dL (ref 31.0–37.0)
MCV: 83.7 fL (ref 80.0–100.0)
MPV: 10.3 fL (ref 9.1–12.4)
Monocytes %: 3.9 %
Monocytes Absolute: 0.51 10*3/uL (ref 0.38–0.83)
NRBC %: 0 % (ref 0.0–0.0)
NRBC Absolute: 0 10*3/uL (ref 0.00–2.00)
Neutrophil %: 82.5 %
Neutrophils Absolute: 10.77 10*3/uL — ABNORMAL HIGH (ref 1.50–7.95)
Platelets: 176 10*3/uL (ref 150–400)
RBC: 4.06 M/uL — ABNORMAL LOW (ref 4.20–5.90)
RDW-CV: 13.9 % (ref 11.5–14.5)
RDW-SD: 42.7 fL (ref 35.0–51.0)
WBC: 13.1 10*3/uL — ABNORMAL HIGH (ref 4.0–11.0)

## 2021-08-11 LAB — BASIC METABOLIC PANEL
Anion Gap: 5 mmol/L (ref 3–14)
Anion Gap: 6 mmol/L (ref 3–14)
Anion Gap: 7 mmol/L (ref 3–14)
BUN: 42 mg/dL — ABNORMAL HIGH (ref 6–24)
BUN: 46 mg/dL — ABNORMAL HIGH (ref 6–24)
BUN: 56 mg/dL — ABNORMAL HIGH (ref 6–24)
CO2 (Bicarbonate): 24 mmol/L (ref 20–32)
CO2 (Bicarbonate): 27 mmol/L (ref 20–32)
CO2 (Bicarbonate): 28 mmol/L (ref 20–32)
Calcium: 8.1 mg/dL — ABNORMAL LOW (ref 8.5–10.5)
Calcium: 8.3 mg/dL — ABNORMAL LOW (ref 8.5–10.5)
Calcium: 8.4 mg/dL — ABNORMAL LOW (ref 8.5–10.5)
Chloride: 105 mmol/L (ref 98–110)
Chloride: 107 mmol/L (ref 98–110)
Chloride: 108 mmol/L (ref 98–110)
Creatinine: 1.73 mg/dL — ABNORMAL HIGH (ref 0.55–1.30)
Creatinine: 1.83 mg/dL — ABNORMAL HIGH (ref 0.55–1.30)
Creatinine: 2.01 mg/dL — ABNORMAL HIGH (ref 0.55–1.30)
Glucose: 245 mg/dL — ABNORMAL HIGH (ref 70–110)
Glucose: 272 mg/dL — ABNORMAL HIGH (ref 70–110)
Glucose: 276 mg/dL — ABNORMAL HIGH (ref 70–110)
Potassium: 4.4 mmol/L (ref 3.6–5.2)
Potassium: 4.5 mmol/L (ref 3.6–5.2)
Potassium: 4.7 mmol/L (ref 3.6–5.2)
Sodium: 137 mmol/L (ref 135–146)
Sodium: 140 mmol/L (ref 135–146)
Sodium: 140 mmol/L (ref 135–146)
eGFRcr: 36 mL/min/{1.73_m2} — ABNORMAL LOW (ref >=60)
eGFRcr: 40 mL/min/{1.73_m2} — ABNORMAL LOW (ref >=60)
eGFRcr: 43 mL/min/{1.73_m2} — ABNORMAL LOW (ref >=60)

## 2021-08-11 LAB — MAGNESIUM
Magnesium: 2.1 mg/dL (ref 1.6–2.6)
Magnesium: 2.2 mg/dL (ref 1.6–2.6)
Magnesium: 2.2 mg/dL (ref 1.6–2.6)

## 2021-08-11 LAB — PHOSPHORUS
Phosphorus: 1.7 mg/dL — ABNORMAL LOW (ref 2.4–4.9)
Phosphorus: 1.8 mg/dL — ABNORMAL LOW (ref 2.4–4.9)
Phosphorus: 2 mg/dL — ABNORMAL LOW (ref 2.4–4.9)

## 2021-08-11 LAB — LIPASE: Lipase: 362 U/L — ABNORMAL HIGH (ref 13–75)

## 2021-08-11 LAB — LAVENDER TOP

## 2021-08-11 LAB — TROPONIN I, HIGH SENSITIVITY: Troponin I, High Sensitivity: 18 ng/L (ref <=59)

## 2021-08-11 MED ORDER — fenofibrate (Tricor) tablet 54 mg
54 | Freq: Every day | ORAL | Status: DC
Start: 2021-08-11 — End: 2021-08-14
  Administered 2021-08-11 – 2021-08-14 (×4): 54 mg via ORAL

## 2021-08-11 MED ORDER — insulin glargine (Lantus) injection 40 Units
100 | Freq: Every morning | SUBCUTANEOUS | Status: DC
Start: 2021-08-11 — End: 2021-08-11

## 2021-08-11 MED ORDER — insulin glargine (Lantus) injection 40 Units
100 | Freq: Every morning | SUBCUTANEOUS | Status: DC
Start: 2021-08-11 — End: 2021-08-14
  Administered 2021-08-11 – 2021-08-14 (×3): 40 [IU] via SUBCUTANEOUS

## 2021-08-11 MED ORDER — bisacodyl (Dulcolax) suppository 10 mg
10 | Freq: Every day | RECTAL | Status: DC
Start: 2021-08-11 — End: 2021-08-12

## 2021-08-11 MED ORDER — potassium and sodium phosphates (K Phos Neutral) tablet 1 tablet
250 | Freq: Four times a day (QID) | ORAL | Status: AC
Start: 2021-08-11 — End: 2021-08-11
  Administered 2021-08-11 – 2021-08-12 (×4): 1 mg via ORAL

## 2021-08-11 MED ORDER — GI cocktail oral suspension 40 mL
40 | Freq: Once | ORAL | Status: AC
Start: 2021-08-11 — End: 2021-08-11
  Administered 2021-08-11: 17:00:00 40 mL via ORAL

## 2021-08-11 MED FILL — ATORVASTATIN 40 MG TABLET: 40 40 mg | ORAL | Qty: 1

## 2021-08-11 MED FILL — FENOFIBRATE 54 MG TABLET: 54 54 mg | ORAL | Qty: 1

## 2021-08-11 MED FILL — SODIUM DI- AND MONOPHOSPHATE-POTASSIUM PHOS MONOBASIC 250 MG TABLET: 250 250 mg | ORAL | Qty: 1

## 2021-08-11 MED FILL — GI COCKTAIL (MAALOX/LIDOCAINE) ORAL SUSPENSION: 40 40 mL | ORAL | Qty: 40

## 2021-08-11 MED FILL — INSULIN GLARGINE (U-100) 100 UNIT/ML SUBCUTANEOUS SOLUTION: 100 100 unit/mL | SUBCUTANEOUS | Qty: 40

## 2021-08-11 MED FILL — PHENOL 1.4 % MUCOSAL AEROSOL SPRAY: 1.4 1.4 % | Qty: 177

## 2021-08-11 MED FILL — INSULIN LISPRO (U-100) 100 UNIT/ML SUBCUTANEOUS SOLUTION: 100 100 unit/mL | SUBCUTANEOUS | Qty: 4

## 2021-08-11 MED FILL — INSULIN LISPRO (U-100) 100 UNIT/ML SUBCUTANEOUS SOLUTION: 100 100 unit/mL | SUBCUTANEOUS | Qty: 6

## 2021-08-11 MED FILL — PANTOPRAZOLE 40 MG TABLET,DELAYED RELEASE: 40 40 mg | ORAL | Qty: 1

## 2021-08-11 NOTE — Care Plan (Signed)
Problem: Adult Inpatient Plan of Care  Goal: Plan of Care Review  Outcome: Ongoing, Progressing  Goal: Patient-Specific Goal (Individualized)  Outcome: Ongoing, Progressing  Goal: Absence of Hospital-Acquired Illness or Injury  Outcome: Ongoing, Progressing  Goal: Optimal Comfort and Wellbeing  Outcome: Ongoing, Progressing  Goal: Readiness for Transition of Care  Outcome: Ongoing, Progressing     Problem: Diabetic Ketoacidosis  Goal: Fluid and Electrolyte Balance with Absence of Ketosis  Outcome: Ongoing, Progressing     Problem: Urinary Retention  Goal: Effective Urinary Elimination  Outcome: Ongoing, Progressing     Problem: Oral Intake Inadequate (Acute Kidney Injury/Impairment)  Goal: Optimal Nutrition Intake  Outcome: Ongoing, Progressing     Problem: Renal Function Impairment (Acute Kidney Injury/Impairment)  Goal: Effective Renal Function  Outcome: Ongoing, Progressing     Problem: Nausea and Vomiting  Goal: Fluid and Electrolyte Balance  Outcome: Ongoing, Progressing   Goal Outcome Evaluation:  Plan of Care Reviewed With: patient  Progress: improving  Outcome Evaluation: Bloodsugar improved and anion gap closed x2 however pt unable to tolerate diet due to nausea/vomiting, throat and epigastric pain after PO intake. Therefore unable to transition off insulin gtt to subq. Per provider, continue insulin gtt and dka protocol until pt is able to eat. zofran, reglan, protonix and throat spray ordered. will continue to monitor and follow protocol as indicated.

## 2021-08-11 NOTE — Nursing Note (Signed)
Pt c/o heartburn this AM, upon further investigation pt. Describes chest pressure, tightness.  Non radiating, no diaphoresis, hemodynamically stable.  EKG obtained.  Shown to providers, no evidence of infarct.  Pt. Continuing to c/o chest pressure.  Dr. Roger Kill notified.  Adding troponin to labs.  GI cocktail given w/o effect.  Awaiting result troponin.

## 2021-08-11 NOTE — Progress Notes (Signed)
MICU  Progress Note    24hr events:  Overnight remained hemodynamically stable. Nausea under control.     VS:  Temp:  [36.8 C (98.3 F)-37.5 C (99.5 F)] 36.8 C (98.3 F)  Pulse:  [69-98] 77  Resp:  [10-19] 16  BP: (100-142)/(46-86) 120/63    24hr I/Os    Intake/Output Summary (Last 24 hours) at 08/11/2021 1129  Last data filed at 08/11/2021 1000  Gross per 24 hour   Intake 3006.35 ml   Output 2525 ml   Net 481.35 ml       Medications  Infusions       Scheduled  atorvastatin, 40 mg, oral, Nightly  fenofibrate, 54 mg, oral, Daily  GI cocktail, 40 mL, oral, Once  heparin (porcine), 5,000 Units, subcutaneous, q8h SCH  insulin glargine, 40 Units, subcutaneous, q AM  insulin lispro, 0-10 Units, subcutaneous, Before meals & nightly  pantoprazole, 40 mg, oral, Nightly  potassium and sodium phosphates, 250 mg, oral, 4x daily      Physical Exam  General: Well appearing older man seated upright in bed, NAD  HEENT: NCAT  CV: RRR  Pulm: CTA  GI: soft, mild generalized tenderness, non-distended  Integ: warm and dry  Neuro: A&Ox3, no focal deficits    Labs:  Lab Results   Component Value Date    WBC 13.1 (H) 08/11/2021    HGB 11.4 (L) 08/11/2021    MCV 83.7 08/11/2021    PLT 176 08/11/2021     Lab Results   Component Value Date    GLUCOSE 276 (H) 08/11/2021    CALCIUM 8.4 (L) 08/11/2021    NA 140 08/11/2021    K 4.7 08/11/2021    CO2 27 08/11/2021    CL 108 08/11/2021    BUN 46 (H) 08/11/2021    CREATININE 1.83 (H) 08/11/2021     Lab Results   Component Value Date    ALT 17 08/09/2021    AST 6 08/09/2021    ALKPHOS 145 (H) 08/09/2021    BILITOT 0.7 08/09/2021       Radiology:  US ABDOMEN LIMITED [[id number]] Collected: 08/10/21 0957   Order Status: Completed Updated: 08/10/21 1001   Narrative:    EXAMINATION: US ABDOMEN LIMITED     INDICATION: Leukocytosis and cholelithiasis.     COMPARISON: CT of the abdomen and pelvis from 08/10/2021.     TECHNIQUE: Ultrasound evaluation of the right upper quadrant.      FINDINGS:     LIVER:    Echogenicity: Normal   Echotexture: Homogeneous   Surface contour: Smooth   Observation: No sonographically apparent mass.   Flow in the main portal vein is appropriately directed.     GALLBLADDER: A few approximately 0.1-0.2 cm monitoring calculi are present (better demonstrated on CT). A bladder sludge is also present. No wall thickening or pericholecystic fluid.     BILIARY:    Intrahepatic bile ducts: No intrahepatic biliary ductal dilation.   Common bile duct: Measures approximately 0.2 cm.     PANCREAS: The visualized pancreas has unremarkable sonographic appearance.     RIGHT KIDNEY: Measures 10.3 cm in craniocaudal dimension. No hydronephrosis or renal calculi.Marland Kitchen     FREE FLUID: No free fluid.      Impression:      Cholelithiasis and sludge without sonographic evidence of acute cholecystitis.     Ardis Rowan, MD 08/10/2021 9:59 AM     === 08/09/21 ===    XR CHEST 1 VIEW    -  Impression -  No evidence of an acute cardiopulmonary process.    Hendricks Limes, MD 08/10/2021 6:49 AM      Problem List:  Principal Problem:    DKA, type 2, not at goal (CMS/HCC)      Assessment & Plan:  Patient is a 66 year old man with PMH including IDDM, HTN, HLP, who is visiting from [facility] and essentially hasn't used his insulin in the past 10 days. Developed nausea, vomiting and abdominal discomfort which led to him presenting to ED. Found to be in DKA and probable AKI (baseline Cr unknown). In ICU for continued care.    Neuro:   - No active issues  - Tylenol available for pain if needed    CV:   - HDS  - holding home AceI  - Continue home statin/ resume home fenofibrate  - EKG with sinus tach    Pulm:   - no active issues  - CXR without acute cardiopulmonary process    GI:   - nausea/vomiting in setting of DKA  - elevated lipase without evidence of pancreatitis on CT, suspect DKA related  - CT showed cholelithiasis, given leukocytosis, abdominal U/S was ordered to best assess for possible cholecystitis - no issues  - Started on  diabetic diet  - tolerating well     Renal:   - suspect AKI although baseline Cr unknown, likely prerenal/dehydration related in setting of vomiting/poor PO intake, continues to make urine, Cr downtrending  - replete potassium  - Making adequate urine    ID:   - afebrile, +leukocytosis (suspect stress response in setting of DKA/vomiting)  - UA negative  - CXR w/out acute findings and no respiratory symptoms  - started on Unasyn for possible cholecystitis, now discontinued     Endocrine/BG:   - DKA in setting of medication non-compliance  - anion gap closed, Insulin gtt off and resumed 40 U lantus given BS in the low 100's, consider increasing dose as able (Home dose is 72 U)  - Now on sliding scale       ICU BUNDLE:  Access: PIV  Diet: diabetic diet  Ppx: [facility] (patient refusing)  Code status: Full  Dispo: IMC    Shirlee Latch AGACNP-BC

## 2021-08-12 LAB — CBC WITH DIFFERENTIAL
Basophils %: 0.5 %
Basophils Absolute: 0.05 10*3/uL (ref 0.00–0.22)
Eosinophils %: 2.5 %
Eosinophils Absolute: 0.24 10*3/uL (ref 0.00–0.50)
Hematocrit: 35.6 % — ABNORMAL LOW (ref 37.0–53.0)
Hemoglobin: 11.8 g/dL — ABNORMAL LOW (ref 13.0–17.5)
Immature Granulocytes %: 0.4 %
Immature Granulocytes Absolute: 0.04 10*3/uL (ref 0.00–0.10)
Lymphocyte %: 18.6 %
Lymphocytes Absolute: 1.8 10*3/uL (ref 0.70–4.00)
MCH: 28.3 pg (ref 26.0–34.0)
MCHC: 33.1 g/dL (ref 31.0–37.0)
MCV: 85.4 fL (ref 80.0–100.0)
MPV: 11 fL (ref 9.1–12.4)
Monocytes %: 5.2 %
Monocytes Absolute: 0.5 10*3/uL (ref 0.38–0.83)
NRBC %: 0 % (ref 0.0–0.0)
NRBC Absolute: 0 10*3/uL (ref 0.00–2.00)
Neutrophil %: 72.8 %
Neutrophils Absolute: 7.06 10*3/uL (ref 1.50–7.95)
Platelets: 175 10*3/uL (ref 150–400)
RBC: 4.17 M/uL — ABNORMAL LOW (ref 4.20–5.90)
RDW-CV: 13.6 % (ref 11.5–14.5)
RDW-SD: 42.5 fL (ref 35.0–51.0)
WBC: 9.7 10*3/uL (ref 4.0–11.0)

## 2021-08-12 LAB — POCT GLUCOSE
POCT Glucose: 176 mg/dL — ABNORMAL HIGH (ref 70–110)
POCT Glucose: 153 mg/dL — ABNORMAL HIGH (ref 70–110)
POCT Glucose: 106 mg/dL (ref 70–110)
POCT Glucose: 151 mg/dL — ABNORMAL HIGH (ref 70–110)
POCT Glucose: 133 mg/dL — ABNORMAL HIGH (ref 70–110)

## 2021-08-12 LAB — BASIC METABOLIC PANEL
Anion Gap: 8 mmol/L (ref 3–14)
BUN: 31 mg/dL — ABNORMAL HIGH (ref 6–24)
CO2 (Bicarbonate): 24 mmol/L (ref 20–32)
Calcium: 8.3 mg/dL — ABNORMAL LOW (ref 8.5–10.5)
Chloride: 110 mmol/L (ref 98–110)
Creatinine: 1.39 mg/dL — ABNORMAL HIGH (ref 0.55–1.30)
Glucose: 198 mg/dL — ABNORMAL HIGH (ref 70–110)
Potassium: 4.8 mmol/L (ref 3.6–5.2)
Sodium: 142 mmol/L (ref 135–146)
eGFRcr: 56 mL/min/{1.73_m2} — ABNORMAL LOW (ref >=60)

## 2021-08-12 LAB — PHOSPHORUS: Phosphorus: 2.2 mg/dL — ABNORMAL LOW (ref 2.4–4.9)

## 2021-08-12 LAB — MAGNESIUM: Magnesium: 2 mg/dL (ref 1.6–2.6)

## 2021-08-12 MED ORDER — sodium chloride 0.9 % infusion
0.9 | INTRAVENOUS | Status: DC
Start: 2021-08-12 — End: 2021-08-14
  Administered 2021-08-12 – 2021-08-14 (×3): 75 mL/h via INTRAVENOUS

## 2021-08-12 MED ORDER — docusate sodium (Colace) capsule 100 mg
100 | Freq: Two times a day (BID) | ORAL | Status: DC
Start: 2021-08-12 — End: 2021-08-14
  Administered 2021-08-12 – 2021-08-14 (×5): 100 mg via ORAL

## 2021-08-12 MED ORDER — polyethylene glycol (Glycolax) packet 17 g
17 | Freq: Every day | ORAL | Status: DC
Start: 2021-08-12 — End: 2021-08-14
  Administered 2021-08-12 – 2021-08-14 (×3): 17 g via ORAL

## 2021-08-12 MED FILL — FENOFIBRATE 54 MG TABLET: 54 54 mg | ORAL | Qty: 1

## 2021-08-12 MED FILL — INSULIN LISPRO (U-100) 100 UNIT/ML SUBCUTANEOUS SOLUTION: 100 100 unit/mL | SUBCUTANEOUS | Qty: 2

## 2021-08-12 MED FILL — HEPARIN (PORCINE) 5,000 UNIT/ML INJECTION SOLUTION: 5000 5,000 unit/mL | INTRAMUSCULAR | Qty: 1

## 2021-08-12 MED FILL — DOCUSATE SODIUM 100 MG CAPSULE: 100 100 mg | ORAL | Qty: 1

## 2021-08-12 MED FILL — PANTOPRAZOLE 40 MG TABLET,DELAYED RELEASE: 40 40 mg | ORAL | Qty: 1

## 2021-08-12 MED FILL — INSULIN LISPRO (U-100) 100 UNIT/ML SUBCUTANEOUS SOLUTION: 100 100 unit/mL | SUBCUTANEOUS | Qty: 4

## 2021-08-12 MED FILL — INSULIN GLARGINE (U-100) 100 UNIT/ML SUBCUTANEOUS SOLUTION: 100 100 unit/mL | SUBCUTANEOUS | Qty: 40

## 2021-08-12 MED FILL — SODIUM DI- AND MONOPHOSPHATE-POTASSIUM PHOS MONOBASIC 250 MG TABLET: 250 250 mg | ORAL | Qty: 1

## 2021-08-12 MED FILL — POLYETHYLENE GLYCOL 3350 17 GRAM ORAL POWDER PACKET: 17 17 gram | ORAL | Qty: 1

## 2021-08-12 MED FILL — ATORVASTATIN 40 MG TABLET: 40 40 mg | ORAL | Qty: 1

## 2021-08-12 NOTE — Care Plan (Signed)
Goal Outcome Evaluation:  Plan of Care Reviewed With: patient  Progress: improving  Outcome Evaluation: Stable blood sugars, last CBS 133. No insulin needed at HS.    Continuing to do mouth care and utilizing mouth spray at bedside for comfort. Chapstick at bedside for dry/cracked lips.

## 2021-08-12 NOTE — Progress Notes (Signed)
PROGRESS NOTE    Today's Date: 08/13/2021  MRN: [id number]  Name: Rodney Olson  DOB: 06/20/1956    Subjective   Pt seen and examined at bedside this AM  Case discussed with RN  Vitals stable  No acute events overnight    improving blood sugar   Objective   Physical Exam  GENERAL: Patient is alert and oriented, in no acute distress.   HEENT: Conjunctivae are noninjected. Oropharynx: Moist mucous membranes. No erythema or lesions.  RESPIRATORY: Normal inspiratory and expiratory effort. Lungs are clear to auscultation bilaterally.  CARDIOVASCULAR: Regular rate and rhythm, S1, S2, without murmur, rub, or gallop.  ABDOMEN: Nontender and non-distended.   EXTREMITIES: +2 pulses bilaterally. No edema.  Neurologic: Grossly intact, no focal deficits, moving all 4 extremities. Left lower leg surgically absent.   Psychiatric: Normal mood and affect.    Last Recorded Vitals  Blood pressure 129/69, pulse 77, temperature 36.8 C (98.2 F), temperature source Oral, resp. rate 19, height 1.88 m, weight 68.9 kg, SpO2 (!) 93 %.    Medications      Start Medication Dose/Rate, Route, Frequency Ordered Stop    08/12/21 0900 docusate sodium (Colace) capsule 100 mg         100 mg, oral, 2 times daily 08/12/21 0753 --    08/12/21 0900 polyethylene glycol (Glycolax) packet 17 g         17 g, oral, Daily 08/12/21 0753 --    08/12/21 0900 sodium chloride 0.9 % infusion         75 mL/hr, IV, Continuous 08/12/21 0845 --    08/11/21 1015 fenofibrate (Tricor) tablet 54 mg         54 mg, oral, Daily 08/11/21 1006 --    08/11/21 0903 insulin glargine (Lantus) injection 40 Units         40 Units, subQ, Every morning 08/11/21 0903 --    08/10/21 2200 atorvastatin (Lipitor) tablet 40 mg         40 mg, oral, Nightly 08/10/21 0939 --    08/10/21 2130 pantoprazole (ProtoNix) EC tablet 40 mg         40 mg, oral, Nightly 08/10/21 2118 --    08/10/21 2115 phenoL (Chloraseptic) 1.4 % mouth/throat spray 1 spray         1 spray, MT, Every 2 hour PRN 08/10/21 2115  --    08/10/21 1403 metoclopramide (Reglan) injection 10 mg         10 mg, IV, Every 8 hours PRN 08/10/21 1403 --    08/10/21 1230 insulin lispro (HumaLOG) injection 0-10 Units         0-10 Units, subQ, 4 times daily (before meals and nightly) 08/10/21 1228 --    08/10/21 1222 glucagon injection 1 mg         1 mg, subQ, Every 15 min PRN 08/10/21 1228 --    08/09/21 2355 heparin (porcine) injection 5,000 Units         5,000 Units, subQ, Every 8 hours scheduled 08/09/21 2354 --    08/09/21 2354 acetaminophen (Tylenol) tablet 650 mg        See Hyperspace for full Linked Orders Report.    650 mg, oral, Every 6 hours PRN 08/09/21 2354 --    08/09/21 2354 acetaminophen (Tylenol) solution 650 mg        See Hyperspace for full Linked Orders Report.    650 mg, oral, Every 6 hours PRN 08/09/21 2354 --  08/09/21 2354 acetaminophen (Tylenol) suppository 650 mg        See Hyperspace for full Linked Orders Report.    650 mg, rect, Every 6 hours PRN 08/09/21 2354 --    08/09/21 2354 ondansetron ODT (Zofran-ODT) disintegrating tablet 4 mg        See Hyperspace for full Linked Orders Report.    4 mg, oral, Every 8 hours PRN 08/09/21 2354 --    08/09/21 2354 ondansetron (Zofran) injection 4 mg        See Hyperspace for full Linked Orders Report.    4 mg, IV, Every 8 hours PRN 08/09/21 2354 --    08/09/21 2354 dextrose 50 % in water (D50W) solution 25-50 mL         25-50 mL, IV, Every 1 hour PRN 08/09/21 2354 --    08/09/21 2354 dextrose (D10W) 10 % bolus 125-250 mL         125-250 mL, IV, Every 1 hour PRN 08/09/21 2354 --    08/09/21 2354 glucagon injection 1 mg         1 mg, subQ, Once as needed 08/09/21 2354 --    08/09/21 2320 sodium chloride 0.9 % infusion         125 mL/hr, IV, Continuous 08/09/21 2315 08/10/21 0742                   Assessment/Plan   Principal Problem:    DKA, type 2, not at goal (CMS/HCC)    Patient is a 66 year old man with PMH including IDDM, HTN, HLP, who is visiting from [place] and essentially hasn't used  his insulin in the past 10 days. Developed nausea, vomiting and abdominal discomfort which led to him presenting to ED. Found to be in DKA and probable AKI (baseline Cr unknown). In ICU for continued care.and subsequently downgraded to [facility] level of care    Neuro:   - No active issues  - Tylenol available for pain if needed    CV:   - hemodynamically stable   - holding home AceI  - will resume home statin and continue to hold fenofibrate given AKI  - EKG with sinus tach    Pulm:   - no active issues  - CXR without acute cardiopulmonary process    GI:   - nausea/vomiting in setting of DKA  - elevated lipase without evidence of pancreatitis on CT, suspect DKA related  - CT showed cholelithiasis, given leukocytosis, abdominal U/S was ordered to best assess for possible cholecystitis - will f/u results  -diet has been advsnced to carb consistent die    Renal:   - suspect AKI although baseline Cr unknown, likely prerenal/dehydration related in setting of vomiting/poor PO intake, continues to make urine, Cr downtrending  - c/w with IVF   -daily BMP to check creatinine     ID:   - afebrile, +leukocytosis (suspect stress response in setting of DKA/vomiting)  - UA negative  - CXR w/out acute findings and no respiratory symptoms  - started on Unasyn for possible cholecystitis, in the icu , ultrasound did not show  sonographic evidence of acute cholecystitis. But showed cholelithiasis with sludge ,      Endocrine/BG:   - DKA in setting of medication non-compliance  - initially requiring insulin drip until anion gap closed and later changed to lantus and lispro sliding scale  -monitor blood sugars closely         Ppx: [facility] (patient refusing)  Code status: Full         Zamyiah Tino FATHIMA Scarlette Calico, MD

## 2021-08-13 LAB — CBC WITH DIFFERENTIAL
Basophils %: 0.8 %
Basophils Absolute: 0.06 10*3/uL (ref 0.00–0.22)
Eosinophils %: 3.8 %
Eosinophils Absolute: 0.3 10*3/uL (ref 0.00–0.50)
Hematocrit: 34.9 % — ABNORMAL LOW (ref 37.0–53.0)
Hemoglobin: 11.6 g/dL — ABNORMAL LOW (ref 13.0–17.5)
Immature Granulocytes %: 0.3 %
Immature Granulocytes Absolute: 0.02 10*3/uL (ref 0.00–0.10)
Lymphocyte %: 22.9 %
Lymphocytes Absolute: 1.79 10*3/uL (ref 0.70–4.00)
MCH: 28.4 pg (ref 26.0–34.0)
MCHC: 33.2 g/dL (ref 31.0–37.0)
MCV: 85.5 fL (ref 80.0–100.0)
MPV: 10.5 fL (ref 9.1–12.4)
Monocytes %: 4.7 %
Monocytes Absolute: 0.37 10*3/uL — ABNORMAL LOW (ref 0.38–0.83)
NRBC %: 0 % (ref 0.0–0.0)
NRBC Absolute: 0 10*3/uL (ref 0.00–2.00)
Neutrophil %: 67.5 %
Neutrophils Absolute: 5.26 10*3/uL (ref 1.50–7.95)
Platelets: 175 10*3/uL (ref 150–400)
RBC: 4.08 M/uL — ABNORMAL LOW (ref 4.20–5.90)
RDW-CV: 13.6 % (ref 11.5–14.5)
RDW-SD: 42.6 fL (ref 35.0–51.0)
WBC: 7.8 10*3/uL (ref 4.0–11.0)

## 2021-08-13 LAB — BASIC METABOLIC PANEL
Anion Gap: 7 mmol/L (ref 3–14)
BUN: 26 mg/dL — ABNORMAL HIGH (ref 6–24)
CO2 (Bicarbonate): 22 mmol/L (ref 20–32)
Calcium: 8.7 mg/dL (ref 8.5–10.5)
Chloride: 114 mmol/L — ABNORMAL HIGH (ref 98–110)
Creatinine: 1.24 mg/dL (ref 0.55–1.30)
Glucose: 93 mg/dL (ref 70–110)
Potassium: 4.2 mmol/L (ref 3.6–5.2)
Sodium: 143 mmol/L (ref 135–146)
eGFRcr: 64 mL/min/{1.73_m2} (ref >=60)

## 2021-08-13 LAB — POCT GLUCOSE
POCT Glucose: 83 mg/dL (ref 70–110)
POCT Glucose: 135 mg/dL — ABNORMAL HIGH (ref 70–110)
POCT Glucose: 256 mg/dL — ABNORMAL HIGH (ref 70–110)
POCT Glucose: 171 mg/dL — ABNORMAL HIGH (ref 70–110)

## 2021-08-13 MED ORDER — pantoprazole (ProtoNix) injection 40 mg
40 | Freq: Once | INTRAVENOUS | Status: DC
Start: 2021-08-13 — End: 2021-08-13

## 2021-08-13 MED ORDER — alum-mag hydroxide-simeth (Mylanta) 200-200-20 mg/5 mL oral suspension 20 mL
200-200-20 | Freq: Once | ORAL | Status: AC
Start: 2021-08-13 — End: 2021-08-13
  Administered 2021-08-14: 05:00:00 20 mL via ORAL

## 2021-08-13 MED ORDER — diphenhydrAMINE (BENADryl) injection 25 mg
50 | Freq: Once | INTRAMUSCULAR | Status: AC
Start: 2021-08-13 — End: 2021-08-13
  Administered 2021-08-14: 05:00:00 25 mg via INTRAVENOUS

## 2021-08-13 MED ORDER — magic mouthwash (lidocaine, diphenhydrAMINE, Maalox 1:1:1)
2 | Status: DC | PRN
Start: 2021-08-13 — End: 2021-08-14

## 2021-08-13 MED FILL — HEPARIN (PORCINE) 5,000 UNIT/ML INJECTION SOLUTION: 5000 5,000 unit/mL | INTRAMUSCULAR | Qty: 1

## 2021-08-13 MED FILL — PANTOPRAZOLE 40 MG TABLET,DELAYED RELEASE: 40 40 mg | ORAL | Qty: 1

## 2021-08-13 MED FILL — POLYETHYLENE GLYCOL 3350 17 GRAM ORAL POWDER PACKET: 17 17 gram | ORAL | Qty: 1

## 2021-08-13 MED FILL — DOCUSATE SODIUM 100 MG CAPSULE: 100 100 mg | ORAL | Qty: 1

## 2021-08-13 MED FILL — INSULIN LISPRO (U-100) 100 UNIT/ML SUBCUTANEOUS SOLUTION: 100 100 unit/mL | SUBCUTANEOUS | Qty: 6

## 2021-08-13 MED FILL — ATORVASTATIN 40 MG TABLET: 40 40 mg | ORAL | Qty: 1

## 2021-08-13 MED FILL — MAGIC MOUTHWASH ORAL SUSPENSION MIXTURE (LIDO/BENADRYL/MAALOX): 12.5 15.0000 mL | ORAL | Qty: 20

## 2021-08-13 NOTE — Progress Notes (Signed)
Speech Therapy Note      Patient Name: Rodney Olson  MRN: [id number]  DOB: [birth year]    SPEECH-LANGUAGE PATHOLOGY  INPATIENT SWALLOWING EVALUATION    Type of Session Clinical Swallowing Evaluation Visit # 1   Today's Date 08/13/21 Time Spent in Session 60 Minutes   Date of Onset 08/09/21 Medical Diagnosis DKA, Type 2   Start of Care 08/13/21 Treatment Diagnosis Rule-Out Dysphagia   Plan of Care Start Date N/A Plan of Care End Date N/A        I.  PATIENT HISTORY   History may be obtained from a variety of sources, including EMR, paper records, discussion with staff, patient, and family.  Brief Introduction:  Mr. Rodney Olson is a 66 year old male who presented to the [facility] on 08/09/2021 for evaluation of fatigue, malaise, vomiting, and elevated blood sugar. He had been visiting from [place] for the last 12 days or so and had not taken really any of his medications. Daughter had brought him in and had stated that he is fairly noncompliant overall and continues to smoke quite a bit. ED labs remarkable for DKA, AKI, elevated Lipase and leukocytosis. UA negative for UTI, CT A/P only showed cholelithiasis. Pt was initially admitted to the ICU, now on OCU for further management.    Reason for Referral:  SLP was consulted to assess the pt's oropharyngeal swallow mechanism and to recommend the safest yet least restrictive PO diet.    Medical and Surgery History:  Pt with past medical history significant for: T2IDDM, HTN, HLP. Pt reported hx of reflux.    Social / Educational History:  The pt typically resides at home?     Prior Swallowing History:  The pt is unfamiliar to the SLP department here at [facility]. He denied history of dysphagia, though endorsed that since being here in the hospital, sometimes it takes several swallows for something to pass. Upon chart review, appears the pt was having difficulty tolerating a PO diet due to nausea/vomiting, throat and epigastric pain after PO intake.     Diet and Liquid  Consistency PRIOR to admission:  Regular Consistency solids, Thin liquids (Per pt report.)    CXR and Other Relevant Diagnostic Information:  CXR 08/10/2021: No evidence of an acute cardiopulmonary process.    RN Reports:  Pt appears to be tolerating present diet without apparent swallowing difficulty. Pt sometimes complains of difficulty swallowing.      PPE: Surgical Mask, Gloves        II.  EXAMINATION       A.  Current Status   Follows Simple Commands Functionally: Yes   Expresses Needs Functionally: Yes   Vision Functional : Yes   Hearing Functional for Conversation: Yes   Respiratory Modality: Room Air       B.  Oral Mechanism   Dentition: Original dentition, Missing dentition   Labial Strength: WFL   Facial Symmetry: WFL   Lingual Strength: WFL   Oral Cavity: WFL   Volitional Swallow: WFL   Volitional Cough: WFL       C.  Consistencies Trialed   L0: Thin Liquids: diet coke via straw   Regular: saltine crackers        III.  ASSESSMENT       A.  INTRODUCTION AND OBSERVATIONS:  SLP received consult for clinical bedside swallow evaluation, completed chart review, and spoke with nursing. SLP entered the pt's room to find him awake in bed. Pt reluctant to participate  in evaluation, stating that while he did have throat pain, he does not have trouble swallowing. Denied history of pneumonia or unintentional weight loss prior to hospitalization.          B.  ORAL PHASE  Labial seal was adequate without anterior loss. Adequate intraoral pressure was achieved to propel liquids via straw. Lingual strength and ROM was adequate for bolus manipulation and propulsion. Anterior to posterior transit was timely. Mastication was timely and characterized by a rotary chew. No oral stasis present post swallow.         C. PHARYNGEAL PHASE  Pt without overt s/sx pharyngeal struggle across all consistencies. x1 delayed throat clear after meal appreciated. Voice remained clear upon phonation. Swallow trigger appeared  prompt. Hyolaryngeal elevation and excursion present.         D.  OVERALL SUMMARY & IMPRESSIONS  Mr. Rodney Olson presented with a grossly adequate oropharyngeal swallow mechanism at bedside. Suspect that his reported throat pain is due to the several days of nausea and vomiting prior to his hospitalization in addition to his continued vomiting here in the hospital. From an oropharyngeal perspective, the pt is appropriate to continue on a regular solid diet with thin liquids and meds as tolerated. As the pt has reported sx of reflux in the past, would maintain reflux precautions. Consider consulting GI should his nausea/vomiting/epigastric pain persist. No further acute SLP needs indicated at this time. Please re-consult should the pt demonstrate an acute change in oropharyngeal swallow function. Thank you.        IV.  RECOMMENDATIONS  Recommended Solid Consistency: CONTINUE ON, Regular Consistency  Recommended Liquid Consistency: CONTINUE ON, L0: Thin Liquids  Administer Liquids Via: Per patient preference  Administer Medications (with MD/PharmD Approval): Regular Pills as Tolerated  Compensatory Techniques: Reflux Precautions  Positional Techniques: Upright 90 Degrees for All Meals, Keep Upright for 30 Minutes After all PO  Supervision: Assist with Feeding if Needed  Mouthcare: Mouthcare at a minimum of 3x Daily with a toothbrush & Toothpaste if Possible  Modified Barium Swallow Recommendations: Does not appear to be clinically indicated at this time     Education  Results, Recommendations, & Plan Discussed with: Patient, Registered Nurse  Education Provided Regarding: Normal Swallowing, Plan of Care  Method of Education: Discussion         V.  PLAN OF CARE  Is Skilled SLP Therapy Recommended: No           Rodney Donovan, MS, CCC-SLP  [place]. [id number]

## 2021-08-13 NOTE — Progress Notes (Signed)
Nutrition Assessment Initial  Name: Rodney Olson  MRN: [id number]  DOB: [birth year]    Date Seen: 08/13/2021    Reason for Visit/Consult: Standards of Care    Admitting Diagnosis: DKA, type 2, not at goal (CMS/HCC) [E11.10]  Acute renal failure, unspecified acute renal failure type (CMS/HCC) [N17.9]  Acute pancreatitis, unspecified complication status, unspecified pancreatitis type [K85.90]    Past Medical History:  Past Medical History:   Diagnosis Date   . Below knee amputation (CMS/HCC)     Left leg   . Diabetes mellitus (CMS/HCC)    . Melanoma (CMS/HCC)    . Prostate CA (CMS/HCC)        Current Diet order:  Dietary Orders (From admission, onward)     Start     Ordered    08/10/21 1222  Adult diet Room service; Carb consistent; 90 gm CHO  Diet effective now        Question Answer Comment   Room Service Room service    Diet type: Carb consistent    Carbohydrate restriction: 90 gm CHO        08/10/21 1222                Allergies:  No Known Allergies    Assessment  66 YOM presents w/ fatigue, malaise, vomiting, and elevated blood sugar. Initially admitted to the ICU, but downgraded to [facility] on 1/15. "Visiting from [facility] and essentially hasn't used his insulin in the past 10 days. Developed nausea, vomiting and abdominal discomfort which led to him presenting to ED. Found to be in DKA and probable AKI (baseline Cr unknown)." Receiving IVF 2/2 AKI. CT and abd US showed cholelithiasis. Glucose has been >180 since admission, but today is WNL. Pressure injury to L knee and wound to sacrum.    Met w/ pt bedside. He tells me my appetite is good, but I don't eat everything, I try to eat as much as I can. For breakfast he ate 100% of omelette and strawb ice cream, and some of a peaches cup. Per EMR he is eating 75% of meals. He reports he has been vomiting bile which has caused difficulty swallowing at times, and sometimes it takes him a minute to swallow. Offered to consult speech therapy to assess his swallow function, but pt  refused. Reports he is tolerating regular solids well. Per visual NFPE he has mild temple, clavicle, and interosseous wasting. He denies food allergies. No [facility] wt hx PTA.    Pt reports he bought protein shakes (Ensure/Boost) at home, but has not drank much of them d/t chalky taste. He is agreeable to receive vanilla SF Mighty Shakes BID while inpatient to provide optimal nutrition and extra pro for wound healing. CCHO 90g diet remains appropriate at this time. RD to follow per [facility].                                                 Anthropometrics:  Height: 188 cm  Weight: 68.9 kg  Weight Method: Bedscale  IBW (kg) (Calculated) : 82.2 kg (Correct IBW per RD: 86.4kg)  % IBW: 80%     BMI (Calculated): 19.49 WNL (18.5 - 24.9)    Recent Wt Hx:  Wt Readings from Last 50 Encounters:   08/10/21 68.9 kg     Wts in the last 14 days:  08/10/21  0100 68.9 kg Bed scale   08/09/21 2100 81.6 kg Stated     Energy Needs:  Height: 188 cm  IBW (kg) (Calculated) : 82.2 kg  Temp: 36.8 C (98.2 F)  Equation Utilized: kcals/kg  Kcals/kg: 1725 - 2075 (25-30 kcal/kg abw)    Total Protein Estimated Needs : 83 - 96 (1.2-1.4 g pro/kg abw) wounds    Method for Estimating Needs: ml/kg  Total Fluid Estimated Needs: 1 ml/kcal                        GI Assessment:  Gastrointestinal (WDL): Exceptions to WDL  Abdomen Inspection: Flat, Nondistended  Abdominal Tenderness: Soft, Nontender  Bowel Sounds: All quadrants  Bowel Sounds (All Quadrants): Active, Present  Passing Flatus: Yes  Last BM Date: 08/08/21  Gastrointestinal Symptoms: Constipation  Constipation Precipitating Factors: Disease related, Medication (Iron supplements, opioids, antidepressants, etc.), Diet    Skin/Edema:  Integumentary  Integumentary (WDL): Exceptions to WDL  Skin Color: Normal for ethnicity  Skin Condition/Temp: Dry, Intact, Warm  Skin Integrity: Abrasion, Redness (Knee abrasion)  Redness Location: coccyx  Redness Characteristics: blanchable redness  Skin Turgor:  Non-tenting  Nutrition: Probably inadequate  Edema: None    Meds:  Scheduled:  atorvastatin, 40 mg, oral, Nightly  docusate sodium, 100 mg, oral, BID  fenofibrate, 54 mg, oral, Daily  heparin (porcine), 5,000 Units, subcutaneous, q8h SCH  insulin glargine, 40 Units, subcutaneous, q AM  insulin lispro, 0-10 Units, subcutaneous, Before meals & nightly  pantoprazole, 40 mg, oral, Nightly  polyethylene glycol, 17 g, oral, Daily    Continuous:  sodium chloride, 75 mL/hr, Last Rate: 75 mL/hr (08/12/21 0909)        Labs:  Lab Results   Component Value Date    GLUCOSE 93 08/13/2021    NA 143 08/13/2021    K 4.2 08/13/2021    CL 114 (H) 08/13/2021    CO2 22 08/13/2021    BUN 26 (H) 08/13/2021    CREATININE 1.24 08/13/2021    CALCIUM 8.7 08/13/2021    ALBUMIN 4.0 08/09/2021    PROT 8.7 (H) 08/09/2021    MG 2.0 08/12/2021    PHOS 2.2 (L) 08/12/2021     Corrected Calcium: 10.3 (Calculated from:; Serum Albumin: 4.0 g/dL at 04/11/7828  5:62 PM; Calcium (Uncorrected): 10.3 mg/dL at 08/28/8655  8:46 PM)    Malnutrition Criteria + Nutrition Focused Physical Findings:   N/A    Nutrition Diagnosis  Nutrition Diagnosis: Increased nutrient needs  Related to: wounds; mild muscle wasting  As Evidenced By: established guidelines; NFPE.    Intervention  Nutrition Interventions: carbohydrate - modified diet, Collaboration with other providers, Medical food supplements    Recommendations:  1. Continue diet a/o    2. Recommend SF Mighty Shakes BID    Monitoring/Evaluation  PO intake, wt trends, wound healing, labs, meds, GI, POC    Follow Up:  Nutrition Level: Level 3, Mild  RD to follow per [facility] on 08/20/21    Jerolyn Shin, RD

## 2021-08-13 NOTE — Progress Notes (Signed)
PROGRESS NOTE    Today's Date: 09/18/2021  MRN: [id number]  Name: Rodney Olson  DOB: 1955/09/27    Subjective   Pt seen and examined at bedside   Case discussed with RN   Vitals stable   No acute events overnight      Objective   Physical Exam  GENERAL: Patient is alert and oriented, in no acute distress.   HEENT: Conjunctivae are noninjected. Oropharynx: Moist mucous membranes. No erythema or lesions.  RESPIRATORY: Normal inspiratory and expiratory effort. Lungs are clear to auscultation bilaterally.  CARDIOVASCULAR: Regular rate and rhythm, S1, S2, without murmur, rub, or gallop.  ABDOMEN: Nontender and non-distended.   EXTREMITIES: +2 pulses bilaterally. No edema.  Neurologic: Grossly intact, no focal deficits, moving all 4 extremities. Left lower leg surgically absent.   Psychiatric: Normal mood and affect.  Last Recorded Vitals  Blood pressure 139/78, pulse 73, temperature 36.4 C (97.6 F), temperature source Oral, resp. rate 12, height 1.88 m, weight 68.9 kg, SpO2 98 %.    Medications      Start Medication Dose/Rate, Route, Frequency Ordered Stop    08/09/21 2320 sodium chloride 0.9 % infusion         125 mL/hr, IV, Continuous 08/09/21 2315 08/10/21 0742                   Assessment/Plan   Principal Problem:    DKA, type 2, not at goal (CMS/HCC)  Patient is a 66 year old man with PMH including IDDM, HTN, HLP, who is visiting from [place] and essentially hasn't used his insulin in the past 10 days. Developed nausea, vomiting and abdominal discomfort which led to him presenting to ED. Found to be in DKA and probable AKI (baseline Cr unknown). In ICU for continued care.and subsequently downgraded to [facility] level of care    Neuro:  - No active issues  - Tylenol available for pain if needed    CV:  - hemodynamically stable   - holding home AceI  - will resume home statin and continue to hold fenofibrate given AKI  - EKG with sinus tach    Pulm:  - no active issues  - CXR without acute cardiopulmonary  process    GI:  - nausea/vomiting in setting of DKA  - elevated lipase without evidence of pancreatitis on CT, suspect DKA related  - CT showed cholelithiasis, given leukocytosis, abdominal U/S was ordered to best assess for possible cholecystitis - will f/u results  -diet has been advsnced to carb consistent die    Renal:  - suspect AKI although baseline Cr unknown, likely prerenal/dehydration related in setting of vomiting/poor PO intake, continues to make urine, Cr downtrending  - c/w with IVF   -daily BMP to check creatinine     ID:  - afebrile, +leukocytosis (suspect stress response in setting of DKA/vomiting)  - UA negative  - CXR w/out acute findings and no respiratory symptoms  - started on Unasyn for possible cholecystitis, in the icu , ultrasound did not show sonographic evidence of acute cholecystitis. But showed cholelithiasis with sludge ,      Endocrine/BG:  - DKA in setting of medication non-compliance  - initially requiring insulin drip until anion gap closed and later changed to lantus and lispro sliding scale  -monitor blood sugars closely         Ppx:[facility] (patient refusing)  Code status:Full           Aime Meloche FATHIMA Scarlette Calico, MD

## 2021-08-14 ENCOUNTER — Other Ambulatory Visit

## 2021-08-14 LAB — POCT GLUCOSE
POCT Glucose: 148 mg/dL — ABNORMAL HIGH (ref 70–110)
POCT Glucose: 172 mg/dL — ABNORMAL HIGH (ref 70–110)
POCT Glucose: 158 mg/dL — ABNORMAL HIGH (ref 70–110)

## 2021-08-14 LAB — BASIC METABOLIC PANEL
Anion Gap: 5 mmol/L (ref 3–14)
BUN: 23 mg/dL (ref 6–24)
CO2 (Bicarbonate): 23 mmol/L (ref 20–32)
Calcium: 8.6 mg/dL (ref 8.5–10.5)
Chloride: 114 mmol/L — ABNORMAL HIGH (ref 98–110)
Creatinine: 1.16 mg/dL (ref 0.55–1.30)
Glucose: 124 mg/dL — ABNORMAL HIGH (ref 70–110)
Potassium: 4 mmol/L (ref 3.6–5.2)
Sodium: 142 mmol/L (ref 135–146)
eGFRcr: 69 mL/min/{1.73_m2} (ref >=60)

## 2021-08-14 LAB — CBC WITH DIFFERENTIAL
Basophils %: 0.7 %
Basophils Absolute: 0.05 10*3/uL (ref 0.00–0.22)
Eosinophils %: 4.6 %
Eosinophils Absolute: 0.33 10*3/uL (ref 0.00–0.50)
Hematocrit: 32.6 % — ABNORMAL LOW (ref 37.0–53.0)
Hemoglobin: 10.8 g/dL — ABNORMAL LOW (ref 13.0–17.5)
Immature Granulocytes %: 0.4 %
Immature Granulocytes Absolute: 0.03 10*3/uL (ref 0.00–0.10)
Lymphocyte %: 26 %
Lymphocytes Absolute: 1.87 10*3/uL (ref 0.70–4.00)
MCH: 28.1 pg (ref 26.0–34.0)
MCHC: 33.1 g/dL (ref 31.0–37.0)
MCV: 84.9 fL (ref 80.0–100.0)
MPV: 10.9 fL (ref 9.1–12.4)
Monocytes %: 6.7 %
Monocytes Absolute: 0.48 10*3/uL (ref 0.38–0.83)
NRBC %: 0 % (ref 0.0–0.0)
NRBC Absolute: 0 10*3/uL (ref 0.00–2.00)
Neutrophil %: 61.6 %
Neutrophils Absolute: 4.44 10*3/uL (ref 1.50–7.95)
Platelets: 182 10*3/uL (ref 150–400)
RBC: 3.84 M/uL — ABNORMAL LOW (ref 4.20–5.90)
RDW-CV: 13.2 % (ref 11.5–14.5)
RDW-SD: 41 fL (ref 35.0–51.0)
WBC: 7.2 10*3/uL (ref 4.0–11.0)

## 2021-08-14 LAB — HEMOGLOBIN A1C
Estimated Average Glucose mg/dL (INT/EXT): 278 mg/dL
HEMOGLOBIN A1C % (INT/EXT): 11.3 % — ABNORMAL HIGH (ref <5.6)

## 2021-08-14 MED FILL — INSULIN LISPRO (U-100) 100 UNIT/ML SUBCUTANEOUS SOLUTION: 100 100 unit/mL | SUBCUTANEOUS | Qty: 2

## 2021-08-14 MED FILL — DOCUSATE SODIUM 100 MG CAPSULE: 100 100 mg | ORAL | Qty: 1

## 2021-08-14 MED FILL — ATORVASTATIN 40 MG TABLET: 40 40 mg | ORAL | Qty: 1

## 2021-08-14 MED FILL — PANTOPRAZOLE 40 MG INTRAVENOUS SOLUTION: 40 40 mg | INTRAVENOUS | Qty: 40

## 2021-08-14 MED FILL — HEPARIN (PORCINE) 5,000 UNIT/ML INJECTION SOLUTION: 5000 5,000 unit/mL | INTRAMUSCULAR | Qty: 1

## 2021-08-14 MED FILL — INSULIN GLARGINE (U-100) 100 UNIT/ML SUBCUTANEOUS SOLUTION: 100 100 unit/mL | SUBCUTANEOUS | Qty: 40

## 2021-08-14 MED FILL — ALUMINUM-MAG HYDROXIDE-SIMETHICONE 200 MG-200 MG-20 MG/5 ML ORAL SUSP: 200-200-20 200-200-20 mg/5 mL | ORAL | Qty: 30

## 2021-08-14 MED FILL — PANTOPRAZOLE 40 MG TABLET,DELAYED RELEASE: 40 40 mg | ORAL | Qty: 1

## 2021-08-14 MED FILL — DIPHENHYDRAMINE 50 MG/ML INJECTION SOLUTION: 50 50 mg/mL | INTRAMUSCULAR | Qty: 1

## 2021-08-14 MED FILL — POLYETHYLENE GLYCOL 3350 17 GRAM ORAL POWDER PACKET: 17 17 gram | ORAL | Qty: 1

## 2021-08-14 NOTE — Care Plan (Signed)
Goal Outcome Evaluation:  Plan of Care Reviewed With: patient  Progress: improving  Outcome Evaluation: Stable blood sugars, last blood sugar 171 at 2000. Managing sore throat and heart burn with medication (see MAR).

## 2021-08-14 NOTE — Discharge Summary (Signed)
Discharge Diagnosis  DKA, type 2, not at goal (CMS/HCC)    Hospital Course    Rodney Olson is a 66 y.o. male presenting with pmhx T2IDDM, HTN, HLP, who arrived to the ED w/ hx brought in by his daughter for evaluation of fatigue, malaise, vomiting, and elevated blood sugar. He has been visiting from [place] for the last 12 days or so now. Has not taken really any of his medications. Daughter states that he is fairly noncompliant overall and continues to smoke quite a bit. Patient has a history of diabetes. Tonight his blood sugar was noted to be quite elevated so he did take over 20 units of his Levemir prior to coming to the hospital. He has had some nausea and vomiting and says that it is quite dark brown. Has not had any fevers.   ED labs remarkable for DKA, AKI, elevated Lipase and leukocytosis. UA negative for UTI, CT A/P only showed cholelithiasis.   EKG: Sinus tachycardia at a rate of 107 bpm without acute ischemic ST-T changes.    DKA resolved BA 120s resume home regimen    AKI 1/14 cr  1.8, 1/17 cr 1.1    nausea/vomiting in setting of DKA  1/17 resolved    elevated lipase without evidence of pancreatitis on CT, suspect DKA related  1/17 lipase 362    CT showed cholelithiasis, given leukocytosis, abdominal U/S: Cholelithiasis and sludge without sonographic evidence of acute cholecystitis      Test Results Pending At Discharge  Pending Labs     Order Current Status    Hemoglobin A1c In process    Blood culture, peripheral #1 Preliminary result    Blood culture, peripheral #2 Preliminary result        Pertinent Physical Exam At Time of Discharge  Physical Exam  Physical exam:  General: Well-appearing patient examined while lying in bed no acute distress  HEENT: Atraumatic normocephalic sclera clear nonicteric membranes moist neck supple  Cardiovascular: S1-S2 regular rate and rhythm  Respiratory: Lung sounds clear no rhonchi or wheeze  GI: Abdomen soft nontender nondistended positive bowel  sounds  GU: No CVA tenderness  Extremities: Pulses present no edema  Neuro: Cranial nerves II through XII intact to gross exam  Psych: Alert and oriented x3, appropriate  Issues Requiring Follow-Up      Outpatient Follow-Up  No future appointments.    FACE TO FACE PATIENT COUNSELLING COORDINATING CARE MORE THAN 50% OF ENCOUNTER TIME : YES   TOTAL ENCOUNTER TIME:  35 minutes.

## 2021-08-14 NOTE — Progress Notes (Signed)
08/14/21 1406   Case Management Discharge Note    Ambulance Log  Ambulance not needed   Case Management Progress Note:   Medically cleared for discharge. Plan to d/c this evening if tolerates his dinner. Called and spoke with his daughter Dewayne Hatch who will provide ride home. Met with patient at bedside, Medicare IM provided, verbalized understanding. Patient to be discharged with one month supply of all medications from [facility] pharmacy with meds to beds. Patient is in agreement with dc plan.   Ivin Poot, RN Case Manager  08/14/2021   2:15 PM

## 2021-08-14 NOTE — Other (Signed)
Patient Education   Table of Contents       Diabetic Ketoacidosis       Preventing Diabetic Ketoacidosis     To view videos and all your education online visit,   [email or web address]   or scan this QR code with your smartphone.                    Diabetic Ketoacidosis     Diabetic ketoacidosis (DKA) is a serious complication of diabetes. This condition develops when there is not enough insulin in the body. Insulin is a hormone that regulates blood sugar (glucose) levels in the body. Normally, insulin allows glucose to enter the cells in the body. The cells break down glucose for energy. Without enough insulin, the body cannot break down glucose and breaks down fats instead. This leads to high blood glucose levels in the body. It also leads to the production of acids that are called ketones. Ketones are poisonous at high levels.   If DKA is not treated, it can cause severe dehydration and can lead to a coma or death.   What are the causes?    This condition develops when a lack of insulin causes the body to break down fats instead of glucose. This may be triggered by:       Stress on the body. This stress can be brought on by an illness.       Infection.       Medicines that raise blood glucose levels.       Not taking or skipping doses of diabetes medicines.       New onset of type 1 diabetes mellitus.       Missing insulin on purpose or by accident.       Interruption of insulin through an insulin pump. This can happen if the cannula that connects you to the insulin pump gets dislodged or kinked.     What are the signs or symptoms?    Symptoms of this condition include:      Early symptoms may include:       Excessive thirst or dry mouth or excessive urination.      More severe symptoms may include:       Abdominal pain, nausea, or vomiting.       Vision changes.       Fruity or sweet-smelling breath.       Irritability or confusion.       Rapid breathing.      Signs shown by testing include:       High  blood glucose.       High levels of ketones in the body.     How is this diagnosed?   This condition is diagnosed based on your medical history, a physical exam, and blood tests. You may also have a urine test to check for ketones.   How is this treated?    This condition may be treated with:       Fluid replacement. This may be done with IV fluids to correct dehydration.       Correcting high blood glucose with insulin. This may be given through the skin as injections or through an IV.       Electrolyte replacement. Electrolytes are minerals in your blood. Electrolytes such as potassium and sodium may be given in pill form or through an IV.       Antibiotic medicines. These may be prescribed if your condition was caused  by an infection.     DKA is a serious medical condition. You may need emergency treatment in the hospital so that you can be monitored closely.   Follow these instructions at home:   Medicines         Take over-the-counter and prescription medicines only as told by your health care provider.       Continue to take insulin and other diabetes medicines as told by your health care provider.       If you were prescribed an antibiotic medicine, take it as told by your health care provider. Do not  stop using the antibiotic even if you start to feel better.     Eating and drinking            Drink enough fluid to keep your urine pale yellow.       If you are able to eat, follow your usual diet and drink sugar-free liquids, such as water, tea, and sugar-free soft drinks. You can also have sugar-free gelatin or ice pops.       If you are not able to eat, drink liquids that contain sugar in small amounts as you are able. Liquids include fruit juice, regular soft drinks, and sherbet.     Checking ketones and blood glucose           Check your urine for ketones when you are ill and as told by your health care provider.       If your blood glucose is 240 mg/dL (65.7 mmol/L) or higher, check your urine ketones  every 4 hours. If you have moderate or large ketones, call your health care provider.      To check your ketone levels follow these steps:       Collect urine in a small cup.       Dip a test strip in the urine.       Wait for it to change color.       Compare the strip to the color chart that comes with the test kit.            Check your blood glucose every day, and as often as told by your health care provider.       If your blood glucose is high, drink plenty of fluids. This helps flush out ketones.       If your blood glucose is above your target for two tests in a row, contact your health care provider.     General instructions         Carry a medical alert card or wear medical alert jewelry that shows that you have diabetes.       Do exercises as told by your health care provider. Do not  exercise when your blood glucose is high and you have ketones in your urine.       If you get sick, call your health care provider and begin treatment quickly. Your body often needs extra insulin to fight an illness. Check your blood glucose every 4 hours when you are sick.       Keep all follow-up visits. This is important.       Where to find more information    For more information, guidance, and advice, look online here:       American Diabetes Association: diabetes.org         Association of Diabetes Care & Education Specialists: diabeteseducator.org       Contact  a health care provider if:         Your blood glucose level is higher than 240 mg/dL (16.1 mmol/L) for 2 days in a row.       You have moderate or large ketones in your urine.       You have a fever.       You cannot eat or drink without vomiting.       You have been vomiting for more than 2 hours.       You continue to have symptoms of DKA.       You develop new symptoms.     Get help right away if:         Your blood glucose monitor reads high even when you are taking insulin.       You faint.       You have chest pain or you have trouble breathing.        You have sudden trouble speaking or swallowing.       You have vomiting or diarrhea that gets worse after 3 hours.       You are unable to stay awake or you have trouble thinking.      You are severely dehydrated. Symptoms of severe dehydration include:       Extreme thirst.       Dry mouth.       Rapid breathing.     These symptoms may represent a serious problem that is an emergency. Do not wait to see if the symptoms will go away. Get medical help right away. Call your local emergency services (911 in the [place] Do not drive yourself to the hospital.   Summary         DKA is a serious complication of diabetes. This condition develops when there is not enough insulin in the body.       This condition is diagnosed based on your medical history, a physical exam, and blood tests. You may also have a urine test to check for ketones.       DKA is a serious medical condition. You may need emergency treatment in the hospital to monitor your condition.       Contact your health care provider if your blood glucose is higher than 240 mg/dL for 2 days in a row or if you have moderate or large ketones in your urine.     This information is not intended to replace advice given to you by your health care provider. Make sure you discuss any questions you have with your health care provider.     Document Released: 12/15/2001Document Revised: 12/28/2021Document Reviewed: 05/17/2020     Elsevier Patient Education ? 2022 Elsevier Inc.         Preventing Diabetic Ketoacidosis     Diabetic ketoacidosis (DKA) is a life-threatening complication of diabetes (diabetes mellitus). It develops when there is not enough of a hormone called insulin in the body. If the body does not have enough insulin, it cannot divide (break down) sugar (glucose) into usable cells, so it breaks down fats instead. This leads to the production of acids (ketones), which can cause the blood to have too much acid in it (acidosis). DKA is a medical emergency that  must be treated at a hospital.   You may be more likely to develop DKA if you have type 1 diabetes and you take insulin. You can prevent DKA by working closely with your health care provider  to manage your diabetes.   What nutrition changes can be made?            Follow your meal plan, as directed by your health care provider or diet and nutrition specialist (dietitian).       Eat healthy meals at about the same time every day. Have healthy snacks between meals.       Avoid not eating for long periods of time. Do not  skip meals, especially if you are ill.       Avoid regularly eating foods that contain a lot of sugar. Also avoid drinking alcohol. Sugary food and alcohol increase your risk of high blood glucose (hyperglycemia), which increases your risk for DKA.       Drink enough fluid to keep your urine pale yellow. Dehydration increases your risk for DKA.       What actions can I take to lower my risk?       To lower your risk for DKA, manage your diabetes as directed by your health care provider:       Take insulin and other diabetes medicines as directed.       Check your blood glucose every day, as often as directed.       Make a sick day plan in advance with your health care provider. Follow your sick day plan whenever you cannot eat or drink as usual.      Check your urine for ketones as often as directed.       During times when you are sick, check your ketones every 4?6 hours.       If you develop symptoms of DKA, check your ketones right away.      If you have ketones in your urine:       Contact your health care provider right away.      Do not  exercise.       Know the symptoms of DKA so that you can get treatment as soon as possible.       Make sure that people at work, home, and school know how to check your blood glucose, in case you are not able to do it yourself.       Wear a medical alert bracelet or carry a card that says that you have diabetes and lists what medicines you take.       Why are  these changes important?   Preventing high blood glucose and dehydration helps prevent DKA. You may need to work with your health care provider to adjust your diabetes management plan to lower your risk of developing DKA. DKA can lead to a serious medical emergency that can be life-threatening.   Where to find support    For more support with preventing DKA:       Talk with your health care provider.       Consider joining a support group. The American Diabetes Association has an online support community at: community.diabetes.org       Where to find more information    Learn more about preventing DKA from:       American Diabetes Association: diabetes.org         Association of Diabetes Care & Education Specialists: diabeteseducator.org         American Heart Association: heart.org       Contact a health care provider if:    You develop symptoms of DKA, such as:       Fatigue.  Weight loss.       Excessive thirst or excessive urination.       Light-headedness or rapid breathing.       Fruity or sweet-smelling breath.       Vision changes, confusion, or irritability.       Pain in the abdomen, nausea, or vomiting.       Feeling warm in your face (flushed). This may or may not include a reddish color coming to your face.     If you develop any of these symptoms, do not wait to see if the symptoms will go away. Get medical help right away. Call your local emergency services (911 in the [place] Do not drive yourself to the hospital.   Summary         Preventing high blood glucose and dehydration helps prevent DKA. You may need to work with your health care provider to adjust your diabetes management plan to lower your risk of developing DKA.       Check your urine for ketones as often as directed. You may need to check more often when your blood glucose level is high and when you are ill.       DKA is a medical emergency. Make sure you know the symptoms so that you can get treatment right away.     This information  is not intended to replace advice given to you by your health care provider. Make sure you discuss any questions you have with your health care provider.     Document Released: 07/19/2018Document Revised: 10/20/2021Document Reviewed: 05/17/2020     Elsevier Patient Education ? 2022 Elsevier Inc.

## 2021-08-14 NOTE — Progress Notes (Signed)
Physical Therapy Evaluation    Patient Name: Rodney Olson  MRN: [id number]  DOB: Sep 10, 1955  Evaluation Date: 08/14/2021    Subjective   HPI/Hospital Course:   Pt is a 66 y/o M who presents with fatigue, malaise, vomiting, and hyperglycemia. Found to be in DKA 2/2 medication non-compliance    Patient Active Problem List   Diagnosis   . 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 Below knee amputation (CMS/HCC)     Left leg   . Diabetes mellitus (CMS/HCC)    . Melanoma (CMS/HCC)    . Prostate CA (CMS/HCC)      History reviewed. No pertinent surgical history.    Objective     General Information  General Info  PT Received On: 08/14/21  Following Therapy Session:: Patient in Bed, Call bell within reach, Nursing Staff Aware of Patient Location  Plan of Care Reviewed With:: Patient, RN/Charge Nurse  Recommended mobility with nursing staff: Ambulation with LLE prosthesis donned, supervision  Eval Information  Type of Evaluation: Initial Evaluation    Precautions  Other Precautions: Fall Risk    Home Living  Was Patient Admitted from STR?: No  Lives With: Alone (in [place], pt visiting brother locally who is in the hospital)  Type of Home: Mobile Home  Home Layout: One Level  Number of Stairs to Enter Home: 6 STE  Number of Stairs Within Home: 0  Home Equipment: Single [employer]  ADL/IADL Equipment: Paediatric nurse    Prior Level of Function  Information Provided By: Patient  Independent at Baseline: All Household Mobility, All ADL's/IADL's, With Device (LLE prosthesis, intermittently uses a cane)  History of Recent Falls: No  Work Status: Retired    Pain  Pain Assessment: No/denies pain     Cognition  Overall Cognitive Status: Within Functional Limits     ROM/Strength  PROM Right Lower Extremity  Overall RLE PROM: WFL  PROM Left Lower Extremity  Overall LLE PROM: WFL  Strength Right Lower Extremity  RLE Overall Strength: WFL  Strength Left Lower Extremity  LLE Overall Strength: WFL    Mobility  Assessment  Bed Position: Use of bedrail  Supine to Sit: Independent  Sit to Supine: Independent  Bed Mobility Comments: pt able to don LLE prosthesis independently. Min A to don/tie R sneaker    Sit to Stand: Supervision  Stand to Sit: Supervision  Assistive Device: None    Distance (Feet): 80  Assistance Level: Supervision  Assistive Device:  (pt managing IV pole)  Gait Characteristics: wide BOS, lumbering gait, minor pathway deviations    Sitting Balance - Static: good  Sitting Balance - Dynamic: good  Standing Balance - Static: fair  Standing Balance - Dynamic: fair    AMPAC - (Basic Mobility Inpatient Short Form) How much help from another person do you currently need?  Turning from your back to your side while in a flat bed without using bedrails?: 4 - None  Moving from lying on your back to sitting on the side of a flat bed without using bedrails?: 4 - None  Moving to and from a bed to a chair (including a wheelchair)?: 4 - None  Standing up from a chair using your arms (e.g., wheelchair, or bedside chair)?: 3 - A little  To walk in hospital room?: 3 - A little  Climbing 3-5 steps with a railing?: 3 - A little  Raw Score: 21  Education  Education Provided: Role of PT/Plan of Care, Discharge Planning, Adaptive Equipment/Assistive Device, Development worker, communityMobility Training, Safety Awareness/Fall Risk, Activities Guidelines  Education Provided to: Patient  Teaching Method: Discussion  Barriers to learning: None evident  Learning Evaluation: Verbalizes understanding    Assessment   Rodney Lombarddmund presents to PT with impairments in balance, gait, and activity tolerance, likely chronic in nature exacerbated in setting of acute illness. He is able to negotiate short household distances with L prosthesis and single UE support. He demonstrates impaired dynamic balance and decreased safety awareness contributing to fall risk. Will benefit from initial supervision and home PT to maximize functional recovery at d/c.     Goals  Short Term  Goals  Date Established/Amended: 08/14/21  Goal timeframe: 1 week  Transfers goal: Pt will complete stand-step transfers independently  Ambulation goal: Pt will ambulate 100' independently  Stairs goal: Pt will ascend/descend 6 steps with railing and supervision    Plan   Plan: Continued Skilled Inpatient PT Services   Treatment Interventions: Therapeutic exercise, Assistive device training, Functional mobility training, Gait training, Stair training, Patient/family education, Neuromuscular Re-education/balance  PT Frequency and Duration: 3-5 x/week until Discharge  Treatment:      Recommendations  Anticipate Discharge to: Home with support/services  Discharge Assist/Support Needed: Intermittent Supervision  Home services recommended: Home PT  Recommended mobility with nursing staff: Ambulation with LLE prosthesis donned, supervision  Rodney LabMaria Minola Olson, PT  [place] lic # [id number]

## 2021-08-14 NOTE — Progress Notes (Signed)
COC progress note:  Met with patient at bedside.  He stated coming to visit his family and during his visit decided not to use his Levemir.  While he has been here his brother was admitted to ICU at [facility] and has had multiple discharges since.  He was not able to give me an answer as to why he randomly decided not to use insulin.  He does understand the rationale for using insulin as he has been an IDDM since 1996.  He stated recently being changed from Lantus to Levemir and has not had any issues with using pen.Patient states having three pends at home but no refills at home.  Explained that we could have RX sent to [facility] and he can pick up when leaving.  He told me to call his daughter, Dewayne Hatch, at [phone], to discuss discharge as she will be picking him up when ready and in the near future driving him back home to [facility].   VMM left for her.  Speech evaluation states continued regular diet and thin liquids.  Per rounds, he continues with sore throat and heart burn.  He is pending results of PT evaluation.  Patient is eager to go home.  Pending today's NP evaluation.  Marisue Ivan, RN  08/14/2021  11:59 AM

## 2021-08-15 LAB — BLOOD CULTURE
Blood Culture: NO GROWTH
Blood Culture: NO GROWTH

## 2021-10-31 ENCOUNTER — Ambulatory Visit: Payer: Medicare PPO | Admitting: Dermatology

## 2021-10-31 DIAGNOSIS — Z1283 Encounter for screening for malignant neoplasm of skin: Secondary | ICD-10-CM | POA: Diagnosis not present

## 2021-10-31 DIAGNOSIS — L821 Other seborrheic keratosis: Secondary | ICD-10-CM

## 2021-10-31 DIAGNOSIS — L578 Other skin changes due to chronic exposure to nonionizing radiation: Secondary | ICD-10-CM | POA: Diagnosis not present

## 2021-10-31 DIAGNOSIS — L820 Inflamed seborrheic keratosis: Secondary | ICD-10-CM | POA: Diagnosis not present

## 2021-10-31 DIAGNOSIS — L720 Epidermal cyst: Secondary | ICD-10-CM

## 2021-10-31 DIAGNOSIS — L814 Other melanin hyperpigmentation: Secondary | ICD-10-CM

## 2021-10-31 DIAGNOSIS — D1800 Hemangioma unspecified site: Secondary | ICD-10-CM

## 2021-10-31 DIAGNOSIS — Z85828 Personal history of other malignant neoplasm of skin: Secondary | ICD-10-CM | POA: Diagnosis not present

## 2021-10-31 DIAGNOSIS — D229 Melanocytic nevi, unspecified: Secondary | ICD-10-CM

## 2021-10-31 DIAGNOSIS — D692 Other nonthrombocytopenic purpura: Secondary | ICD-10-CM

## 2021-10-31 NOTE — Patient Instructions (Addendum)

## 2021-10-31 NOTE — Progress Notes (Signed)
? ?Follow-Up Visit ?  ?Subjective  ?Allen Chavez is a 65 y.o. male who presents for the following: Annual Exam (The patient presents for Total-Body Skin Exam (TBSE) for skin cancer screening and mole check.  The patient has spots, moles and lesions to be evaluated, some may be new or changing and the patient has concerns that these could be cancer. ). ? ?The following portions of the chart were reviewed this encounter and updated as appropriate:  ? Tobacco  Allergies  Meds  Problems  Med Hx  Surg Hx  Fam Hx   ?  ?Review of Systems:  No other skin or systemic complaints except as noted in HPI or Assessment and Plan. ? ?Objective  ?Well appearing patient in no apparent distress; mood and affect are within normal limits. ? ?A full examination was performed including scalp, head, eyes, ears, nose, lips, neck, chest, axillae, abdomen, back, buttocks, bilateral upper extremities, bilateral lower extremities, hands, feet, fingers, toes, fingernails, and toenails. All findings within normal limits unless otherwise noted below. ? ?forehead ?Smooth white papule(s).  ? ?left hand x 2 ?Stuck-on, waxy, tan-brown papules ? ? ?Assessment & Plan  ?Milia ?forehead ?Benign-appearing.  Observation.  Call clinic for new or changing lesions.  Recommend daily use of broad spectrum spf 30+ sunscreen to sun-exposed areas.  ? ?Inflamed seborrheic keratosis ?left hand x 2 ?Reassured benign age-related growth.  Recommend observation.  Discussed cryotherapy if spot(s) become irritated or inflamed.  ? ?Destruction of lesion - left hand x 2 ?Complexity: simple   ?Destruction method: cryotherapy   ?Informed consent: discussed and consent obtained   ?Timeout:  patient name, date of birth, surgical site, and procedure verified ?Lesion destroyed using liquid nitrogen: Yes   ?Region frozen until ice ball extended beyond lesion: Yes   ?Outcome: patient tolerated procedure well with no complications   ?Post-procedure details: wound care  instructions given   ? ?Skin cancer screening ? ?Lentigines ?- Scattered tan macules ?- Due to sun exposure ?- Benign-appearing, observe ?- Recommend daily broad spectrum sunscreen SPF 30+ to sun-exposed areas, reapply every 2 hours as needed. ?- Call for any changes ? ?Seborrheic Keratoses ?- Stuck-on, waxy, tan-brown papules and/or plaques  ?- Benign-appearing ?- Discussed benign etiology and prognosis. ?- Observe ?- Call for any changes ? ?Melanocytic Nevi ?- Tan-brown and/or pink-flesh-colored symmetric macules and papules ?- Benign appearing on exam today ?- Observation ?- Call clinic for new or changing moles ?- Recommend daily use of broad spectrum spf 30+ sunscreen to sun-exposed areas.  ? ?Hemangiomas ?- Red papules ?- Discussed benign nature ?- Observe ?- Call for any changes ? ?Actinic Damage ?- Chronic condition, secondary to cumulative UV/sun exposure ?- diffuse scaly erythematous macules with underlying dyspigmentation ?- Recommend daily broad spectrum sunscreen SPF 30+ to sun-exposed areas, reapply every 2 hours as needed.  ?- Staying in the shade or wearing long sleeves, sun glasses (UVA+UVB protection) and wide brim hats (4-inch brim around the entire circumference of the hat) are also recommended for sun protection.  ?- Call for new or changing lesions. ? ?Purpura - Chronic; persistent and recurrent.  Treatable, but not curable. ?- Violaceous macules and patches ?- Benign ?- Related to trauma, age, sun damage and/or use of blood thinners, chronic use of topical and/or oral steroids ?- Observe ?- Can use OTC arnica containing moisturizer such as Dermend Bruise Formula if desired ?- Call for worsening or other concerns  ? ?History of Basal Cell Carcinoma of the Skin ?Left neck,  left distal lat bicep 05/22/2021 ?- No evidence of recurrence today ?- Recommend regular full body skin exams ?- Recommend daily broad spectrum sunscreen SPF 30+ to sun-exposed areas, reapply every 2 hours as needed.  ?- Call  if any new or changing lesions are noted between office visits  ? ?Skin cancer screening performed today.  ? ?Return in about 1 year (around 11/01/2022) for TBSE, Hx of BCC. ? ?I, Marye Round, CMA, am acting as scribe for Sarina Ser, MD .  ?Documentation: I have reviewed the above documentation for accuracy and completeness, and I agree with the above. ? ?Sarina Ser, MD ? ?

## 2021-11-01 ENCOUNTER — Encounter: Payer: Self-pay | Admitting: Dermatology

## 2022-10-11 ENCOUNTER — Other Ambulatory Visit: Payer: Self-pay | Admitting: Family Medicine

## 2022-10-11 DIAGNOSIS — F17210 Nicotine dependence, cigarettes, uncomplicated: Secondary | ICD-10-CM

## 2022-10-23 ENCOUNTER — Ambulatory Visit
Admission: RE | Admit: 2022-10-23 | Discharge: 2022-10-23 | Disposition: A | Discharge status: Home / Self Care | Payer: Medicare PPO | Source: Ambulatory Visit | Attending: Family Medicine | Admitting: Family Medicine

## 2022-10-23 DIAGNOSIS — F17210 Nicotine dependence, cigarettes, uncomplicated: Secondary | ICD-10-CM | POA: Diagnosis present

## 2022-11-06 ENCOUNTER — Ambulatory Visit: Payer: Medicare PPO | Admitting: Dermatology

## 2022-11-13 ENCOUNTER — Encounter: Payer: Self-pay | Admitting: Pulmonary Disease

## 2022-11-13 ENCOUNTER — Ambulatory Visit: Payer: Medicare PPO | Admitting: Pulmonary Disease

## 2022-11-13 VITALS — BP 116/60 | HR 65 | Ht 74.0 in | Wt 173.6 lb

## 2022-11-13 DIAGNOSIS — R911 Solitary pulmonary nodule: Secondary | ICD-10-CM | POA: Diagnosis not present

## 2022-11-13 DIAGNOSIS — Z720 Tobacco use: Secondary | ICD-10-CM | POA: Diagnosis not present

## 2022-11-13 DIAGNOSIS — R918 Other nonspecific abnormal finding of lung field: Secondary | ICD-10-CM

## 2022-11-13 NOTE — Patient Instructions (Signed)
Thank you for visiting Dr. Tonia Brooms at [facility]. Today we recommend the following:  Orders Placed This Encounter  Procedures   CT CHEST WO CONTRAST   Return in about 3 months (around 02/12/2023) for with Kandice Robinsons, NP, or Dr. Tonia Brooms, after CT Chest.    Please do your part to reduce the spread of COVID-19.
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
## 2023-01-15 ENCOUNTER — Ambulatory Visit: Payer: Medicare PPO | Admitting: Dermatology

## 2023-02-05 ENCOUNTER — Ambulatory Visit
Admission: RE | Admit: 2023-02-05 | Discharge: 2023-02-05 | Disposition: A | Discharge status: Home / Self Care | Payer: Medicare PPO | Source: Ambulatory Visit | Attending: Pulmonary Disease | Admitting: Pulmonary Disease

## 2023-02-05 DIAGNOSIS — R911 Solitary pulmonary nodule: Secondary | ICD-10-CM | POA: Diagnosis not present

## 2023-02-12 ENCOUNTER — Other Ambulatory Visit: Payer: Self-pay

## 2023-02-12 ENCOUNTER — Ambulatory Visit: Payer: Medicare PPO | Admitting: Pulmonary Disease

## 2023-02-12 ENCOUNTER — Encounter: Payer: Self-pay | Admitting: Pulmonary Disease

## 2023-02-12 VITALS — BP 120/60 | HR 62 | Wt 179.4 lb

## 2023-02-12 DIAGNOSIS — Z720 Tobacco use: Secondary | ICD-10-CM

## 2023-02-12 DIAGNOSIS — R918 Other nonspecific abnormal finding of lung field: Secondary | ICD-10-CM

## 2023-02-12 DIAGNOSIS — R911 Solitary pulmonary nodule: Secondary | ICD-10-CM | POA: Diagnosis not present

## 2023-02-12 DIAGNOSIS — Z87891 Personal history of nicotine dependence: Secondary | ICD-10-CM

## 2023-02-12 DIAGNOSIS — F17210 Nicotine dependence, cigarettes, uncomplicated: Secondary | ICD-10-CM

## 2023-02-12 NOTE — Patient Instructions (Signed)
Thank you for visiting Dr. Tonia Brooms at [facility]. Today we recommend the following:  Return in about 1 year (around 02/12/2024). Follow up with LDCT coordinators     Please do your part to reduce the spread of COVID-19.
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
## 2023-05-28 ENCOUNTER — Inpatient Hospital Stay
Admission: EM | Admit: 2023-05-28 | Discharge: 2023-06-11 | DRG: 853 | Disposition: A | Discharge status: Skilled Nursing Facility | Payer: Medicare PPO | Attending: Internal Medicine | Admitting: Internal Medicine

## 2023-05-28 ENCOUNTER — Other Ambulatory Visit: Payer: Self-pay

## 2023-05-28 ENCOUNTER — Encounter: Payer: Self-pay | Admitting: Intensive Care

## 2023-05-28 ENCOUNTER — Emergency Department: Payer: Medicare PPO

## 2023-05-28 ENCOUNTER — Inpatient Hospital Stay: Payer: Medicare PPO

## 2023-05-28 DIAGNOSIS — L97519 Non-pressure chronic ulcer of other part of right foot with unspecified severity: Secondary | ICD-10-CM | POA: Diagnosis present

## 2023-05-28 DIAGNOSIS — I739 Peripheral vascular disease, unspecified: Secondary | ICD-10-CM | POA: Diagnosis not present

## 2023-05-28 DIAGNOSIS — Z9889 Other specified postprocedural states: Secondary | ICD-10-CM | POA: Diagnosis not present

## 2023-05-28 DIAGNOSIS — Y92230 Patient room in hospital as the place of occurrence of the external cause: Secondary | ICD-10-CM | POA: Diagnosis not present

## 2023-05-28 DIAGNOSIS — I10 Essential (primary) hypertension: Secondary | ICD-10-CM | POA: Diagnosis present

## 2023-05-28 DIAGNOSIS — R6521 Severe sepsis with septic shock: Secondary | ICD-10-CM | POA: Diagnosis present

## 2023-05-28 DIAGNOSIS — Z794 Long term (current) use of insulin: Secondary | ICD-10-CM

## 2023-05-28 DIAGNOSIS — D631 Anemia in chronic kidney disease: Secondary | ICD-10-CM | POA: Diagnosis present

## 2023-05-28 DIAGNOSIS — Z89512 Acquired absence of left leg below knee: Secondary | ICD-10-CM

## 2023-05-28 DIAGNOSIS — E11649 Type 2 diabetes mellitus with hypoglycemia without coma: Secondary | ICD-10-CM | POA: Diagnosis not present

## 2023-05-28 DIAGNOSIS — Z833 Family history of diabetes mellitus: Secondary | ICD-10-CM

## 2023-05-28 DIAGNOSIS — F17210 Nicotine dependence, cigarettes, uncomplicated: Secondary | ICD-10-CM | POA: Diagnosis present

## 2023-05-28 DIAGNOSIS — M86171 Other acute osteomyelitis, right ankle and foot: Secondary | ICD-10-CM | POA: Diagnosis present

## 2023-05-28 DIAGNOSIS — R6520 Severe sepsis without septic shock: Secondary | ICD-10-CM | POA: Diagnosis present

## 2023-05-28 DIAGNOSIS — E785 Hyperlipidemia, unspecified: Secondary | ICD-10-CM

## 2023-05-28 DIAGNOSIS — E11319 Type 2 diabetes mellitus with unspecified diabetic retinopathy without macular edema: Secondary | ICD-10-CM | POA: Diagnosis present

## 2023-05-28 DIAGNOSIS — Z85828 Personal history of other malignant neoplasm of skin: Secondary | ICD-10-CM

## 2023-05-28 DIAGNOSIS — Z8249 Family history of ischemic heart disease and other diseases of the circulatory system: Secondary | ICD-10-CM

## 2023-05-28 DIAGNOSIS — I96 Gangrene, not elsewhere classified: Principal | ICD-10-CM

## 2023-05-28 DIAGNOSIS — N179 Acute kidney failure, unspecified: Secondary | ICD-10-CM | POA: Diagnosis present

## 2023-05-28 DIAGNOSIS — I70261 Atherosclerosis of native arteries of extremities with gangrene, right leg: Secondary | ICD-10-CM | POA: Diagnosis not present

## 2023-05-28 DIAGNOSIS — E538 Deficiency of other specified B group vitamins: Secondary | ICD-10-CM | POA: Diagnosis present

## 2023-05-28 DIAGNOSIS — I708 Atherosclerosis of other arteries: Secondary | ICD-10-CM | POA: Diagnosis not present

## 2023-05-28 DIAGNOSIS — Z806 Family history of leukemia: Secondary | ICD-10-CM

## 2023-05-28 DIAGNOSIS — Z89431 Acquired absence of right foot: Secondary | ICD-10-CM | POA: Diagnosis not present

## 2023-05-28 DIAGNOSIS — A419 Sepsis, unspecified organism: Principal | ICD-10-CM | POA: Diagnosis present

## 2023-05-28 DIAGNOSIS — D649 Anemia, unspecified: Secondary | ICD-10-CM | POA: Insufficient documentation

## 2023-05-28 DIAGNOSIS — E11621 Type 2 diabetes mellitus with foot ulcer: Secondary | ICD-10-CM | POA: Diagnosis present

## 2023-05-28 DIAGNOSIS — D62 Acute posthemorrhagic anemia: Secondary | ICD-10-CM | POA: Diagnosis present

## 2023-05-28 DIAGNOSIS — E872 Acidosis, unspecified: Secondary | ICD-10-CM | POA: Diagnosis present

## 2023-05-28 DIAGNOSIS — Z8546 Personal history of malignant neoplasm of prostate: Secondary | ICD-10-CM

## 2023-05-28 DIAGNOSIS — W06XXXA Fall from bed, initial encounter: Secondary | ICD-10-CM | POA: Diagnosis not present

## 2023-05-28 DIAGNOSIS — E1169 Type 2 diabetes mellitus with other specified complication: Secondary | ICD-10-CM | POA: Diagnosis present

## 2023-05-28 DIAGNOSIS — N1831 Chronic kidney disease, stage 3a: Secondary | ICD-10-CM | POA: Diagnosis present

## 2023-05-28 DIAGNOSIS — E119 Type 2 diabetes mellitus without complications: Secondary | ICD-10-CM

## 2023-05-28 DIAGNOSIS — C61 Malignant neoplasm of prostate: Secondary | ICD-10-CM | POA: Diagnosis present

## 2023-05-28 DIAGNOSIS — N401 Enlarged prostate with lower urinary tract symptoms: Secondary | ICD-10-CM | POA: Diagnosis present

## 2023-05-28 DIAGNOSIS — K219 Gastro-esophageal reflux disease without esophagitis: Secondary | ICD-10-CM

## 2023-05-28 DIAGNOSIS — I129 Hypertensive chronic kidney disease with stage 1 through stage 4 chronic kidney disease, or unspecified chronic kidney disease: Secondary | ICD-10-CM | POA: Diagnosis present

## 2023-05-28 DIAGNOSIS — E1165 Type 2 diabetes mellitus with hyperglycemia: Secondary | ICD-10-CM | POA: Diagnosis present

## 2023-05-28 DIAGNOSIS — R54 Age-related physical debility: Secondary | ICD-10-CM | POA: Diagnosis present

## 2023-05-28 DIAGNOSIS — I701 Atherosclerosis of renal artery: Secondary | ICD-10-CM | POA: Diagnosis not present

## 2023-05-28 DIAGNOSIS — E1152 Type 2 diabetes mellitus with diabetic peripheral angiopathy with gangrene: Secondary | ICD-10-CM | POA: Diagnosis present

## 2023-05-28 DIAGNOSIS — E1122 Type 2 diabetes mellitus with diabetic chronic kidney disease: Secondary | ICD-10-CM | POA: Diagnosis present

## 2023-05-28 DIAGNOSIS — R509 Fever, unspecified: Secondary | ICD-10-CM | POA: Diagnosis present

## 2023-05-28 DIAGNOSIS — R7989 Other specified abnormal findings of blood chemistry: Secondary | ICD-10-CM | POA: Diagnosis present

## 2023-05-28 DIAGNOSIS — Z7982 Long term (current) use of aspirin: Secondary | ICD-10-CM

## 2023-05-28 DIAGNOSIS — R3915 Urgency of urination: Secondary | ICD-10-CM | POA: Diagnosis present

## 2023-05-28 DIAGNOSIS — I7789 Other specified disorders of arteries and arterioles: Secondary | ICD-10-CM | POA: Diagnosis not present

## 2023-05-28 DIAGNOSIS — Z79899 Other long term (current) drug therapy: Secondary | ICD-10-CM

## 2023-05-28 LAB — CBC WITH DIFFERENTIAL/PLATELET
Abs Immature Granulocytes: 0.24 10*3/uL — ABNORMAL HIGH (ref 0.00–0.07)
Basophils Absolute: 0.1 10*3/uL (ref 0.0–0.1)
Basophils Relative: 0 %
Eosinophils Absolute: 0 10*3/uL (ref 0.0–0.5)
Eosinophils Relative: 0 %
HCT: 34.2 % — ABNORMAL LOW (ref 39.0–52.0)
Hemoglobin: 11.5 g/dL — ABNORMAL LOW (ref 13.0–17.0)
Immature Granulocytes: 1 %
Lymphocytes Relative: 5 %
Lymphs Abs: 1 10*3/uL (ref 0.7–4.0)
MCH: 27.7 pg (ref 26.0–34.0)
MCHC: 33.6 g/dL (ref 30.0–36.0)
MCV: 82.4 fL (ref 80.0–100.0)
Monocytes Absolute: 1.1 10*3/uL — ABNORMAL HIGH (ref 0.1–1.0)
Monocytes Relative: 6 %
Neutro Abs: 15.8 10*3/uL — ABNORMAL HIGH (ref 1.7–7.7)
Neutrophils Relative %: 88 %
Platelets: 302 10*3/uL (ref 150–400)
RBC: 4.15 MIL/uL — ABNORMAL LOW (ref 4.22–5.81)
RDW: 15 % (ref 11.5–15.5)
WBC: 18.1 10*3/uL — ABNORMAL HIGH (ref 4.0–10.5)
nRBC: 0 % (ref 0.0–0.2)

## 2023-05-28 LAB — PROTIME-INR
INR: 1.3 — ABNORMAL HIGH (ref 0.8–1.2)
Prothrombin Time: 16.6 s — ABNORMAL HIGH (ref 11.4–15.2)

## 2023-05-28 LAB — SEDIMENTATION RATE: Sed Rate: 81 mm/h — ABNORMAL HIGH (ref 0–20)

## 2023-05-28 LAB — TSH: TSH: 12.732 u[IU]/mL — ABNORMAL HIGH (ref 0.350–4.500)

## 2023-05-28 LAB — HEMOGLOBIN A1C
Hgb A1c MFr Bld: 7.1 % — ABNORMAL HIGH (ref 4.8–5.6)
Mean Plasma Glucose: 157.07 mg/dL

## 2023-05-28 LAB — COMPREHENSIVE METABOLIC PANEL
ALT: 14 U/L (ref 0–44)
AST: 22 U/L (ref 15–41)
Albumin: 3.6 g/dL (ref 3.5–5.0)
Alkaline Phosphatase: 71 U/L (ref 38–126)
Anion gap: 11 (ref 5–15)
BUN: 45 mg/dL — ABNORMAL HIGH (ref 8–23)
CO2: 21 mmol/L — ABNORMAL LOW (ref 22–32)
Calcium: 9.2 mg/dL (ref 8.9–10.3)
Chloride: 101 mmol/L (ref 98–111)
Creatinine, Ser: 3.24 mg/dL — ABNORMAL HIGH (ref 0.61–1.24)
GFR, Estimated: 20 mL/min — ABNORMAL LOW (ref >60)
Glucose, Bld: 157 mg/dL — ABNORMAL HIGH (ref 70–99)
Potassium: 4 mmol/L (ref 3.5–5.1)
Sodium: 133 mmol/L — ABNORMAL LOW (ref 135–145)
Total Bilirubin: 1 mg/dL (ref 0.3–1.2)
Total Protein: 8.1 g/dL (ref 6.5–8.1)

## 2023-05-28 LAB — C-REACTIVE PROTEIN: CRP: 27.3 mg/dL — ABNORMAL HIGH (ref <1.0)

## 2023-05-28 LAB — CBG MONITORING, ED
Glucose-Capillary: 177 mg/dL — ABNORMAL HIGH (ref 70–99)
Glucose-Capillary: 207 mg/dL — ABNORMAL HIGH (ref 70–99)

## 2023-05-28 LAB — LACTIC ACID, PLASMA
Lactic Acid, Venous: 3.7 mmol/L (ref 0.5–1.9)
Lactic Acid, Venous: 1.6 mmol/L (ref 0.5–1.9)

## 2023-05-28 LAB — GLUCOSE, CAPILLARY: Glucose-Capillary: 73 mg/dL (ref 70–99)

## 2023-05-28 MED ORDER — ACETAMINOPHEN 325 MG PO TABS
650.0000 mg | ORAL_TABLET | Freq: Four times a day (QID) | ORAL | Status: DC | PRN
  Administered 2023-05-29 – 2023-05-30 (×2): 650 mg via ORAL
  Filled 2023-05-28 (×2): qty 2

## 2023-05-28 MED ORDER — ONDANSETRON HCL 4 MG PO TABS: 4.0000 mg | ORAL_TABLET | Freq: Four times a day (QID) | ORAL | Status: DC | PRN

## 2023-05-28 MED ORDER — POLYETHYLENE GLYCOL 3350 17 G PO PACK: 17.0000 g | PACK | Freq: Every day | ORAL | Status: DC | PRN

## 2023-05-28 MED ORDER — ATORVASTATIN CALCIUM 20 MG PO TABS
40.0000 mg | ORAL_TABLET | Freq: Every day | ORAL | Status: DC
  Administered 2023-05-28 – 2023-06-11 (×13): 40 mg via ORAL
  Filled 2023-05-28 (×13): qty 2

## 2023-05-28 MED ORDER — LACTATED RINGERS IV SOLN
INTRAVENOUS | Status: AC
Start: 2023-05-28 — End: 2023-05-29

## 2023-05-28 MED ORDER — INSULIN ASPART 100 UNIT/ML IJ SOLN
0.0000 [IU] | INTRAMUSCULAR | Status: DC
Start: 2023-05-28 — End: 2023-06-01
  Administered 2023-05-28: 5 [IU] via SUBCUTANEOUS
  Administered 2023-05-28: 3 [IU] via SUBCUTANEOUS
  Administered 2023-05-29: 2 [IU] via SUBCUTANEOUS
  Administered 2023-05-30 – 2023-05-31 (×4): 3 [IU] via SUBCUTANEOUS
  Administered 2023-05-31 – 2023-06-01 (×3): 2 [IU] via SUBCUTANEOUS
  Filled 2023-05-28 (×10): qty 1

## 2023-05-28 MED ORDER — OXYCODONE HCL 5 MG PO TABS
5.0000 mg | ORAL_TABLET | ORAL | Status: DC | PRN
  Administered 2023-05-29 – 2023-06-10 (×21): 5 mg via ORAL
  Filled 2023-05-28 (×24): qty 1

## 2023-05-28 MED ORDER — HEPARIN SODIUM (PORCINE) 5000 UNIT/ML IJ SOLN
5000.0000 [IU] | Freq: Three times a day (TID) | INTRAMUSCULAR | Status: DC
Start: 2023-05-28 — End: 2023-06-11
  Administered 2023-05-28 – 2023-06-10 (×38): 5000 [IU] via SUBCUTANEOUS
  Filled 2023-05-28 (×42): qty 1

## 2023-05-28 MED ORDER — SODIUM CHLORIDE 0.9 % IV SOLN
2.0000 g | Freq: Once | INTRAVENOUS | Status: AC
  Administered 2023-05-28: 2 g via INTRAVENOUS
  Filled 2023-05-28: qty 12.5

## 2023-05-28 MED ORDER — VANCOMYCIN HCL 750 MG/150ML IV SOLN
750.0000 mg | Freq: Once | INTRAVENOUS | Status: AC
  Administered 2023-05-28: 750 mg via INTRAVENOUS
  Filled 2023-05-28: qty 150

## 2023-05-28 MED ORDER — VANCOMYCIN HCL IN DEXTROSE 1-5 GM/200ML-% IV SOLN
1000.0000 mg | Freq: Once | INTRAVENOUS | Status: AC
  Administered 2023-05-28: 1000 mg via INTRAVENOUS
  Filled 2023-05-28: qty 200

## 2023-05-28 MED ORDER — LACTATED RINGERS IV BOLUS (SEPSIS)
1000.0000 mL | Freq: Once | INTRAVENOUS | Status: AC
  Administered 2023-05-28: 1000 mL via INTRAVENOUS

## 2023-05-28 MED ORDER — METRONIDAZOLE 500 MG/100ML IV SOLN
500.0000 mg | Freq: Two times a day (BID) | INTRAVENOUS | Status: DC
  Administered 2023-05-29 – 2023-06-02 (×9): 500 mg via INTRAVENOUS
  Filled 2023-05-28 (×10): qty 100

## 2023-05-28 MED ORDER — ONDANSETRON HCL 4 MG/2ML IJ SOLN
4.0000 mg | Freq: Four times a day (QID) | INTRAMUSCULAR | Status: DC | PRN
  Administered 2023-06-04: 4 mg via INTRAVENOUS

## 2023-05-28 MED ORDER — ACETAMINOPHEN 650 MG RE SUPP
650.0000 mg | Freq: Four times a day (QID) | RECTAL | Status: DC | PRN
Start: 2023-05-28 — End: 2023-06-11

## 2023-05-28 MED ORDER — LACTATED RINGERS IV BOLUS (SEPSIS)
500.0000 mL | Freq: Once | INTRAVENOUS | Status: AC
  Administered 2023-05-28: 500 mL via INTRAVENOUS

## 2023-05-28 MED ORDER — VANCOMYCIN VARIABLE DOSE PER UNSTABLE RENAL FUNCTION (PHARMACIST DOSING)
Status: DC
  Filled 2023-05-28: qty 1

## 2023-05-28 MED ORDER — SODIUM CHLORIDE 0.9 % IV SOLN
2.0000 g | INTRAVENOUS | Status: DC
  Administered 2023-05-29: 2 g via INTRAVENOUS
  Filled 2023-05-28: qty 12.5

## 2023-05-28 MED ORDER — FENOFIBRATE 160 MG PO TABS
160.0000 mg | ORAL_TABLET | Freq: Every day | ORAL | Status: DC
  Administered 2023-05-29 – 2023-06-11 (×13): 160 mg via ORAL
  Filled 2023-05-28 (×15): qty 1

## 2023-05-28 MED ORDER — SODIUM CHLORIDE 0.9% FLUSH
3.0000 mL | Freq: Two times a day (BID) | INTRAVENOUS | Status: DC
  Administered 2023-05-28 – 2023-06-10 (×20): 3 mL via INTRAVENOUS

## 2023-05-28 MED ORDER — INSULIN GLARGINE-YFGN 100 UNIT/ML ~~LOC~~ SOLN
20.0000 [IU] | Freq: Two times a day (BID) | SUBCUTANEOUS | Status: DC
  Administered 2023-05-29 – 2023-06-06 (×15): 20 [IU] via SUBCUTANEOUS
  Filled 2023-05-28 (×21): qty 0.2

## 2023-05-28 MED ORDER — METRONIDAZOLE 500 MG/100ML IV SOLN
500.0000 mg | Freq: Once | INTRAVENOUS | Status: AC
  Administered 2023-05-28: 500 mg via INTRAVENOUS
  Filled 2023-05-28: qty 100

## 2023-05-28 MED ORDER — PANTOPRAZOLE SODIUM 40 MG PO TBEC
40.0000 mg | DELAYED_RELEASE_TABLET | Freq: Every day | ORAL | Status: DC
  Administered 2023-05-28 – 2023-06-11 (×13): 40 mg via ORAL
  Filled 2023-05-28 (×13): qty 1

## 2023-05-28 NOTE — Assessment & Plan Note (Signed)
Continue PPI ?

## 2023-05-28 NOTE — Assessment & Plan Note (Signed)
History of CKD stage IIIa.  Baseline creatinine seems to be around 1.4-1.5 on care everywhere. Creatinine at 3.25 today, likely secondary to sepsis. -Check bladder scan to rule out any urinary retention -Continue with gentle IV fluid -Monitor renal function -Renal ultrasound

## 2023-05-28 NOTE — Consult Note (Signed)
Pharmacy Antibiotic Note  Allen Chavez is a 67 y.o. male admitted on 05/28/2023 with sepsis.  Pharmacy has been consulted for vancomycin and cefepime dosing.  Patient currently has AKI, Scr = 3.24 (1.5 04/17/23)  Plan: Give total of Vancomycin 1750 mg IV x 1 Variable vancomycin dosing due to AKI Start cefepime 2 grams IV every 24 hours Follow renal function for adjustments  Height: 6\' 2"  (188 cm) Weight: 75.8 kg (167 lb) IBW/kg (Calculated) : 82.2  Temp (24hrs), Avg:98.2 F (36.8 C), Min:98.2 F (36.8 C), Max:98.2 F (36.8 C)  Recent Labs  Lab 05/28/23 1535 05/28/23 1540  WBC 18.1*  --   CREATININE 3.24*  --   LATICACIDVEN  --  3.7*    Estimated Creatinine Clearance: 23.4 mL/min (A) (by C-G formula based on SCr of 3.24 mg/dL (H)).    No Known Allergies  Antimicrobials this admission: vancomycin 10/30 >>  cefepime 10/30 >>  Flagyl 10/30 >>  Dose adjustments this admission: N/A  Microbiology results: 10/30 BCx: pending  Thank you for allowing pharmacy to be a part of this patient's care.  Barrie Folk, PharmD 05/28/2023 6:02 PM

## 2023-05-28 NOTE — Progress Notes (Signed)
PHARMACY -  BRIEF ANTIBIOTIC NOTE   Pharmacy has received consult(s) for vancomycin and cefepime from an ED provider.  [name] profile has been reviewed for ht/wt/allergies/indication/available labs.    One time order(s) placed by MD for vancomycin and cefepime  Further antibiotics/pharmacy consults should be ordered by admitting physician if indicated.                       Thank you, Jisele Price A 05/28/2023  4:17 PM
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
## 2023-05-28 NOTE — Assessment & Plan Note (Signed)
-   Continue home statin °
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
## 2023-05-28 NOTE — ED Notes (Signed)
Patient transported to X-ray 
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
## 2023-05-28 NOTE — Assessment & Plan Note (Signed)
Patient has an history of prostate malignancy. Recent PSA checked last month was undetectable. -Outpatient follow-up

## 2023-05-28 NOTE — ED Provider Notes (Signed)
[facility] Provider Note    Event Date/Time   First MD Initiated Contact with Patient 05/28/23 1543     (approximate)   History   Wound Infection   HPI Faruk Mcneil is a 67 y.o. male  with DM2, HTN, prior left BKA presenting today for wound infection.  Patient states he noted wound on the top of his right foot 4 days ago.  It was around his fourth and fifth toes.  He has noticed worsening of discoloration and now ulceration present.  He has redness streaking up his leg.  Notes fevers and chills at home.  Otherwise denies chest pain, shortness of breath, nausea, vomiting.  Reportedly was very weak when getting out of his car in the parking lot and lowered himself to the ground and was helped up and brought in by wheelchair.  Denies injury to his head or injuries elsewhere from the fall.  Reviewed recent chart notes      Physical Exam   Triage Vital Signs: ED Triage Vitals  Encounter Vitals Group     BP 05/28/23 1532 (!) 94/52     Systolic BP Percentile --      Diastolic BP Percentile --      Pulse Rate 05/28/23 1532 (!) 112     Resp 05/28/23 1532 20     Temp 05/28/23 1532 98.2 F (36.8 C)     Temp Source 05/28/23 1532 Oral     SpO2 05/28/23 1532 97 %     Weight 05/28/23 1533 167 lb (75.8 kg)     Height 05/28/23 1533 6\' 2"  (1.88 m)     Head Circumference --      Peak Flow --      Pain Score 05/28/23 1533 0     Pain Loc --      Pain Education --      Exclude from Growth Chart --     Most recent vital signs: Vitals:   05/28/23 1532  BP: (!) 94/52  Pulse: (!) 112  Resp: 20  Temp: 98.2 F (36.8 C)  SpO2: 97%   Physical Exam: I have reviewed the vital signs and nursing notes. General: Awake, alert, no acute distress.  Nontoxic appearing. Head:  Atraumatic, normocephalic.   ENT:  EOM intact, PERRL. Oral mucosa is pink and moist with no lesions. Neck: Neck is supple with full range of motion, No meningeal signs. Cardiovascular:  RRR, No  murmurs. Peripheral pulses palpable and equal bilaterally. Respiratory:  Symmetrical chest wall expansion.  No rhonchi, rales, or wheezes.  Good air movement throughout.  No use of accessory muscles.   Musculoskeletal:  No cyanosis or edema. Moving extremities with full ROM.  BKA to left lower extremity. Abdomen:  Soft, nontender, nondistended. Neuro:  GCS 15, moving all four extremities, interacting appropriately. Speech clear. Psych:  Calm, appropriate.   Skin: Ulceration with black discoloration of the distal dorsal right foot with purpleish appearing fourth and fifth digit toes.  Erythema streaking up the right leg all the way up to the proximal tibia.   ED Results / Procedures / Treatments   Labs (all labs ordered are listed, but only abnormal results are displayed) Labs Reviewed  COMPREHENSIVE METABOLIC PANEL - Abnormal; Notable for the following components:      Result Value   Sodium 133 (*)    CO2 21 (*)    Glucose, Bld 157 (*)    BUN 45 (*)    Creatinine, Ser 3.24 (*)  GFR, Estimated 20 (*)    All other components within normal limits  LACTIC ACID, PLASMA - Abnormal; Notable for the following components:   Lactic Acid, Venous 3.7 (*)    All other components within normal limits  CBC WITH DIFFERENTIAL/PLATELET - Abnormal; Notable for the following components:   WBC 18.1 (*)    RBC 4.15 (*)    Hemoglobin 11.5 (*)    HCT 34.2 (*)    Neutro Abs 15.8 (*)    Monocytes Absolute 1.1 (*)    Abs Immature Granulocytes 0.24 (*)    All other components within normal limits  PROTIME-INR - Abnormal; Notable for the following components:   Prothrombin Time 16.6 (*)    INR 1.3 (*)    All other components within normal limits  CULTURE, BLOOD (ROUTINE X 2)  CULTURE, BLOOD (ROUTINE X 2)  LACTIC ACID, PLASMA  URINALYSIS, W/ REFLEX TO CULTURE (INFECTION SUSPECTED)  SEDIMENTATION RATE  C-REACTIVE PROTEIN     EKG My EKG interpretation: Rate of 115, sinus tachycardia, normal axis,  normal intervals.  No acute ST elevations or depressions   RADIOLOGY Independently interpreted chest x-ray and right foot x-ray with no acute pathology   PROCEDURES:  Critical Care performed: Yes, see critical care procedure note(s)  .Critical Care  Performed by: Janith Lima, MD Authorized by: Janith Lima, MD   Critical care provider statement:    Critical care time (minutes):  35   Critical care was necessary to treat or prevent imminent or life-threatening deterioration of the following conditions:  Sepsis, renal failure and shock   Critical care was time spent personally by me on the following activities:  Development of treatment plan with patient or surrogate, discussions with consultants, evaluation of patient's response to treatment, examination of patient, ordering and review of laboratory studies, ordering and review of radiographic studies, ordering and performing treatments and interventions, pulse oximetry, re-evaluation of patient's condition and review of old charts   Care discussed with: admitting provider      MEDICATIONS ORDERED IN ED: Medications  lactated ringers bolus 1,000 mL (1,000 mLs Intravenous New Bag/Given 05/28/23 1628)    And  lactated ringers bolus 1,000 mL (has no administration in time range)    And  lactated ringers bolus 500 mL (has no administration in time range)  metroNIDAZOLE (FLAGYL) IVPB 500 mg (has no administration in time range)  vancomycin (VANCOCIN) IVPB 1000 mg/200 mL premix (has no administration in time range)  ceFEPIme (MAXIPIME) 2 g in sodium chloride 0.9 % 100 mL IVPB (2 g Intravenous New Bag/Given 05/28/23 1627)     IMPRESSION / MDM / ASSESSMENT AND PLAN / ED COURSE  I reviewed the triage vital signs and the nursing notes.                              Differential diagnosis includes, but is not limited to, necrotizing soft tissue infection, gangrene right foot, cellulitis, sepsis  Patient's presentation is most  consistent with acute presentation with potential threat to life or bodily function.  Patient is a 67 year old male presenting today for right foot wound with erythema up his right leg.  Vital signs on arrival show tachycardia with hypotension.  Exam consistent with concern for possible gangrenous infection of the right foot with surrounding cellulitis.  Patient's vital signs and lab workup are consistent with sepsis.  Patient given 30 cc/kg of fluids.  Started on broad-spectrum antibiotics  with cefepime, vancomycin, and Flagyl.  WBC elevated 18.1 and lactic elevated at 3.7.  Also noted to have acute renal failure with creatinine of 3.24 likely secondary to infection.  Podiatry was consulted and recommends ABIs and MRI of the right foot.  They are coming to bedside to evaluate to discuss potential surgical option.  Patient admitted to the hospitalist for ongoing care.  The patient is on the cardiac monitor to evaluate for evidence of arrhythmia and/or significant heart rate changes. Clinical Course as of 05/28/23 1706  Wed May 28, 2023  1554 WBC(!): 18.1 [DW]  1631 Creatinine(!): 3.24 [DW]  1642 Lactic Acid, Venous(!!): 3.7 [DW]  1644 Lrinec score of 6. Consulting podiatry for further evaluation. Broad spectrum antibiotics and fluids already started [DW]    Clinical Course User Index [DW] Janith Lima, MD     FINAL CLINICAL IMPRESSION(S) / ED DIAGNOSES   Final diagnoses:  Gangrene of right foot (HCC)  Sepsis with acute renal failure and septic shock, due to unspecified organism, unspecified acute renal failure type [facility])     Rx / DC Orders   ED Discharge Orders     None        Note:  This document was prepared using Dragon voice recognition software and may include unintentional dictation errors.   Janith Lima, MD 05/28/23 [phone]
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
## 2023-05-28 NOTE — ED Notes (Signed)
Right foot wrapped with ABD pad and kerlix prior to MRI

## 2023-05-28 NOTE — Assessment & Plan Note (Signed)
S/p left BKA many years ago. Patient was not on any antiplatelet Vascular surgery was consulted due to diminished pulses on right foot with gangrenous ulcer -Continue statin

## 2023-05-28 NOTE — Assessment & Plan Note (Signed)
Patient was on lisinopril at home.  Blood pressure soft on admission. -Holding lisinopril for AKI and sepsis -Monitor blood pressure

## 2023-05-28 NOTE — Assessment & Plan Note (Signed)
Patient met severe sepsis criteria with leukocytosis, tachycardia and endorgan damage manifested as AKI and lactic acidosis at 3.7. Likely secondary to right foot gangrenous wound. Received IV fluid per sepsis protocol and started on broad-spectrum antibiotics.  Blood cultures were taken Podiatry and vascular surgery was consulted. -Admit to medical telemetry -Follow-up blood cultures -Continue broad-spectrum antibiotics with cefepime, vancomycin and Flagyl. -Follow podiatry recommendations

## 2023-05-28 NOTE — Consult Note (Addendum)
CODE SEPSIS - PHARMACY COMMUNICATION  **Broad Spectrum Antibiotics should be administered within 1 hour of Sepsis diagnosis**  Time Code Sepsis Called/Page Received: 1609  Antibiotics Ordered: vancomycin, cefepime, metronidazole  Time of 1st antibiotic administration: 1627  Additional action taken by pharmacy: n/a  If necessary, Name of Provider/Nurse Contacted: n/a    Bettey Costa ,PharmD Clinical Pharmacist  05/28/2023  4:41 PM

## 2023-05-28 NOTE — Sepsis Progress Note (Signed)
Elink will follow per sepsis protocol  

## 2023-05-28 NOTE — Assessment & Plan Note (Signed)
Patient uses 60 units of Lantus with NovoLog at home. Recent A1c in care everywhere at 7.7 checked in September 2024 -Semglee 20 units twice daily -SSI

## 2023-05-29 ENCOUNTER — Inpatient Hospital Stay: Payer: Medicare PPO

## 2023-05-29 ENCOUNTER — Encounter
Admission: EM | Disposition: A | Discharge status: Skilled Nursing Facility | Payer: Self-pay | Source: Home / Self Care | Attending: Internal Medicine

## 2023-05-29 DIAGNOSIS — I7789 Other specified disorders of arteries and arterioles: Secondary | ICD-10-CM

## 2023-05-29 DIAGNOSIS — I96 Gangrene, not elsewhere classified: Secondary | ICD-10-CM | POA: Diagnosis not present

## 2023-05-29 DIAGNOSIS — I701 Atherosclerosis of renal artery: Secondary | ICD-10-CM

## 2023-05-29 DIAGNOSIS — R6520 Severe sepsis without septic shock: Secondary | ICD-10-CM | POA: Diagnosis not present

## 2023-05-29 DIAGNOSIS — A419 Sepsis, unspecified organism: Secondary | ICD-10-CM | POA: Diagnosis not present

## 2023-05-29 DIAGNOSIS — D649 Anemia, unspecified: Secondary | ICD-10-CM | POA: Insufficient documentation

## 2023-05-29 DIAGNOSIS — I70261 Atherosclerosis of native arteries of extremities with gangrene, right leg: Secondary | ICD-10-CM

## 2023-05-29 DIAGNOSIS — I708 Atherosclerosis of other arteries: Secondary | ICD-10-CM

## 2023-05-29 DIAGNOSIS — R7989 Other specified abnormal findings of blood chemistry: Secondary | ICD-10-CM | POA: Diagnosis present

## 2023-05-29 HISTORY — PX: LOWER EXTREMITY ANGIOGRAPHY: CATH118251

## 2023-05-29 LAB — URINALYSIS, W/ REFLEX TO CULTURE (INFECTION SUSPECTED)
Bilirubin Urine: NEGATIVE
Glucose, UA: 50 mg/dL — AB
Ketones, ur: NEGATIVE mg/dL
Leukocytes,Ua: NEGATIVE
Nitrite: NEGATIVE
Protein, ur: 100 mg/dL — AB
Specific Gravity, Urine: 1.019 (ref 1.005–1.030)
pH: 5 (ref 5.0–8.0)

## 2023-05-29 LAB — BASIC METABOLIC PANEL
Anion gap: 9 (ref 5–15)
BUN: 39 mg/dL — ABNORMAL HIGH (ref 8–23)
CO2: 21 mmol/L — ABNORMAL LOW (ref 22–32)
Calcium: 8.6 mg/dL — ABNORMAL LOW (ref 8.9–10.3)
Chloride: 104 mmol/L (ref 98–111)
Creatinine, Ser: 2.25 mg/dL — ABNORMAL HIGH (ref 0.61–1.24)
GFR, Estimated: 31 mL/min — ABNORMAL LOW (ref >60)
Glucose, Bld: 65 mg/dL — ABNORMAL LOW (ref 70–99)
Potassium: 3.6 mmol/L (ref 3.5–5.1)
Sodium: 134 mmol/L — ABNORMAL LOW (ref 135–145)

## 2023-05-29 LAB — GLUCOSE, CAPILLARY
Glucose-Capillary: 113 mg/dL — ABNORMAL HIGH (ref 70–99)
Glucose-Capillary: 133 mg/dL — ABNORMAL HIGH (ref 70–99)
Glucose-Capillary: 141 mg/dL — ABNORMAL HIGH (ref 70–99)
Glucose-Capillary: 142 mg/dL — ABNORMAL HIGH (ref 70–99)
Glucose-Capillary: 57 mg/dL — ABNORMAL LOW (ref 70–99)
Glucose-Capillary: 58 mg/dL — ABNORMAL LOW (ref 70–99)
Glucose-Capillary: 75 mg/dL (ref 70–99)
Glucose-Capillary: 99 mg/dL (ref 70–99)

## 2023-05-29 LAB — CBC
HCT: 26.8 % — ABNORMAL LOW (ref 39.0–52.0)
Hemoglobin: 9.3 g/dL — ABNORMAL LOW (ref 13.0–17.0)
MCH: 27.8 pg (ref 26.0–34.0)
MCHC: 34.7 g/dL (ref 30.0–36.0)
MCV: 80 fL (ref 80.0–100.0)
Platelets: 276 10*3/uL (ref 150–400)
RBC: 3.35 MIL/uL — ABNORMAL LOW (ref 4.22–5.81)
RDW: 15.1 % (ref 11.5–15.5)
WBC: 14.2 10*3/uL — ABNORMAL HIGH (ref 4.0–10.5)
nRBC: 0 % (ref 0.0–0.2)

## 2023-05-29 LAB — HIV ANTIBODY (ROUTINE TESTING W REFLEX): HIV Screen 4th Generation wRfx: NONREACTIVE

## 2023-05-29 SURGERY — LOWER EXTREMITY ANGIOGRAPHY
Anesthesia: Moderate Sedation | Laterality: Right

## 2023-05-29 MED ORDER — LIDOCAINE-EPINEPHRINE (PF) 1 %-1:200000 IJ SOLN
INTRAMUSCULAR | Status: DC | PRN
  Administered 2023-05-29 (×2): 10 mL

## 2023-05-29 MED ORDER — MORPHINE SULFATE (PF) 4 MG/ML IV SOLN
4.0000 mg | Freq: Once | INTRAVENOUS | Status: AC
  Administered 2023-05-29: 4 mg via INTRAVENOUS
  Filled 2023-05-29: qty 1

## 2023-05-29 MED ORDER — METHYLPREDNISOLONE SODIUM SUCC 125 MG IJ SOLR: 125.0000 mg | Freq: Once | INTRAMUSCULAR | Status: DC | PRN

## 2023-05-29 MED ORDER — SODIUM CHLORIDE 0.9 % IV SOLN: INTRAVENOUS | Status: DC

## 2023-05-29 MED ORDER — MIDAZOLAM HCL 2 MG/ML PO SYRP
8.0000 mg | ORAL_SOLUTION | Freq: Once | ORAL | Status: DC | PRN
  Filled 2023-05-29: qty 5

## 2023-05-29 MED ORDER — ASPIRIN 81 MG PO TBEC
81.0000 mg | DELAYED_RELEASE_TABLET | Freq: Every day | ORAL | Status: DC
  Administered 2023-05-29 – 2023-06-11 (×12): 81 mg via ORAL
  Filled 2023-05-29 (×12): qty 1

## 2023-05-29 MED ORDER — MIDAZOLAM HCL 2 MG/2ML IJ SOLN
INTRAMUSCULAR | Status: DC | PRN
  Administered 2023-05-29: .5 mg via INTRAVENOUS
  Administered 2023-05-29: 2 mg via INTRAVENOUS
  Administered 2023-05-29 (×3): .5 mg via INTRAVENOUS

## 2023-05-29 MED ORDER — HEPARIN SODIUM (PORCINE) 1000 UNIT/ML IJ SOLN
INTRAMUSCULAR | Status: AC
  Filled 2023-05-29: qty 10

## 2023-05-29 MED ORDER — CEFAZOLIN SODIUM-DEXTROSE 2-4 GM/100ML-% IV SOLN
INTRAVENOUS | Status: AC
  Filled 2023-05-29: qty 100

## 2023-05-29 MED ORDER — DEXTROSE IN LACTATED RINGERS 5 % IV SOLN: INTRAVENOUS | Status: AC

## 2023-05-29 MED ORDER — HEPARIN SODIUM (PORCINE) 1000 UNIT/ML IJ SOLN
INTRAMUSCULAR | Status: DC | PRN
  Administered 2023-05-29: 5000 [IU] via INTRAVENOUS

## 2023-05-29 MED ORDER — HEPARIN (PORCINE) IN NACL 1000-0.9 UT/500ML-% IV SOLN
INTRAVENOUS | Status: DC | PRN
  Administered 2023-05-29 (×2): 1000 mL

## 2023-05-29 MED ORDER — FENTANYL CITRATE PF 50 MCG/ML IJ SOSY
PREFILLED_SYRINGE | INTRAMUSCULAR | Status: AC
  Filled 2023-05-29: qty 2

## 2023-05-29 MED ORDER — SODIUM CHLORIDE 0.9 % IV SOLN: Freq: Once | INTRAVENOUS | Status: AC

## 2023-05-29 MED ORDER — IODIXANOL 320 MG/ML IV SOLN
INTRAVENOUS | Status: DC | PRN
  Administered 2023-05-29: 65 mL

## 2023-05-29 MED ORDER — ENSURE ENLIVE PO LIQD
237.0000 mL | Freq: Two times a day (BID) | ORAL | Status: DC
  Administered 2023-05-30 – 2023-06-03 (×7): 237 mL via ORAL

## 2023-05-29 MED ORDER — MIDAZOLAM HCL 5 MG/5ML IJ SOLN
INTRAMUSCULAR | Status: AC
  Filled 2023-05-29: qty 5

## 2023-05-29 MED ORDER — CLOPIDOGREL BISULFATE 75 MG PO TABS
75.0000 mg | ORAL_TABLET | Freq: Every day | ORAL | Status: DC
  Administered 2023-05-29 – 2023-06-11 (×13): 75 mg via ORAL
  Filled 2023-05-29 (×13): qty 1

## 2023-05-29 MED ORDER — CEFAZOLIN SODIUM-DEXTROSE 2-4 GM/100ML-% IV SOLN
2.0000 g | INTRAVENOUS | Status: DC
  Administered 2023-05-29: 2 g via INTRAVENOUS

## 2023-05-29 MED ORDER — DIPHENHYDRAMINE HCL 50 MG/ML IJ SOLN: 50.0000 mg | Freq: Once | INTRAMUSCULAR | Status: DC | PRN

## 2023-05-29 MED ORDER — FENTANYL CITRATE (PF) 100 MCG/2ML IJ SOLN
INTRAMUSCULAR | Status: DC | PRN
  Administered 2023-05-29 (×3): 25 ug via INTRAVENOUS
  Administered 2023-05-29: 50 ug via INTRAVENOUS
  Administered 2023-05-29: 25 ug via INTRAVENOUS

## 2023-05-29 MED ORDER — DEXTROSE 50 % IV SOLN
12.5000 g | INTRAVENOUS | Status: AC
  Administered 2023-05-29: 12.5 g via INTRAVENOUS
  Filled 2023-05-29: qty 50

## 2023-05-29 MED ORDER — FAMOTIDINE 20 MG PO TABS: 40.0000 mg | ORAL_TABLET | Freq: Once | ORAL | Status: DC | PRN

## 2023-05-29 SURGICAL SUPPLY — 26 items
BALLN LUTONIX 018 4X150X130 (BALLOONS) ×1
BALLN LUTONIX 018 5X300X130 (BALLOONS) ×2
BALLN LUTONIX DCB 5X100X130 (BALLOONS) ×1
BALLN LUTONIX DCB 6X60X130 (BALLOONS) ×1
BALLN LUTONIX DCB 7X60X130 (BALLOONS) ×1
BALLOON LUTONIX 018 4X150X130 (BALLOONS) IMPLANT
BALLOON LUTONIX 018 5X300X130 (BALLOONS) IMPLANT
BALLOON LUTONIX DCB 5X100X130 (BALLOONS) IMPLANT
BALLOON LUTONIX DCB 6X60X130 (BALLOONS) IMPLANT
BALLOON LUTONIX DCB 7X60X130 (BALLOONS) IMPLANT
CATH ANGIO 5F PIGTAIL 65CM (CATHETERS) IMPLANT
CATH TEMPO 5F RIM 65CM (CATHETERS) IMPLANT
CATH VERT 5X100 (CATHETERS) IMPLANT
COVER PROBE ULTRASOUND 5X96 (MISCELLANEOUS) IMPLANT
DEVICE STARCLOSE SE CLOSURE (Vascular Products) IMPLANT
GLIDEWIRE ADV .035X260CM (WIRE) IMPLANT
GUIDEWIRE PFTE-COATED .018X300 (WIRE) IMPLANT
KIT ENCORE 26 ADVANTAGE (KITS) IMPLANT
PACK ANGIOGRAPHY (CUSTOM PROCEDURE TRAY) ×1 IMPLANT
SHEATH ANL2 6FRX45 HC (SHEATH) IMPLANT
SHEATH BRITE TIP 5FRX11 (SHEATH) IMPLANT
STENT VIABAHN 6X100X120 (Permanent Stent) IMPLANT
STENT VIABAHN 6X250X120 (Permanent Stent) IMPLANT
SYR MEDRAD MARK 7 150ML (SYRINGE) IMPLANT
TUBING CONTRAST HIGH PRESS 72 (TUBING) IMPLANT
WIRE EMERALD 3MM-J .035X150CM (WIRE) IMPLANT

## 2023-05-29 NOTE — Hospital Course (Addendum)
Allen Chavez is a 67 y.o. male with medical history significant of insulin-dependent diabetes mellitus, hypertension, PAD and CKD stage IIIa presented to ED with generalized malaise and worsening right foot wound with concern of gangrene and surrounding erythema with tracking up to upper legs for the past 2 days.  Patient also endorsed some subjective fever and chills at home. He was having generalized malaise and poor p.o. intake for the past couple of days.  On presentation  Patient was afebrile, tachycardic at 112 and softer blood pressure 94/52.  Labs pertinent for leukocytosis at 18.1, absolute neutrophil 15.8, hemoglobin 11.5, lactic acid 3.7, sodium 133, CO2 21, glucose 157, BUN 45, creatinine 3.24 with GFR of 20, baseline seems to be around 1.4-1.5.  CXR with no active cardiopulmonary disease. Right foot imaging with asymmetric soft tissue swelling around fifth toe with some probable small amount of air in the soft tissue.  Also noted to have a small cortical breach in the distal phalanx, concerning for acute osteomyelitis.  MRI was ordered but pending at the time of interview. EKG-personally reviewed, sinus tachycardia with HR of 115, no significant ST-T changes.   Patient met severe sepsis criteria and blood cultures were drawn.  Received IV fluid and started on broad-spectrum antibiotics.  Vascular surgery and podiatry was consulted.  10/31: Afebrile this morning, maximum temperature recorded of 100.5 overnight.  Lactic acidosis resolved, as some improvement in leukocytosis and hemoglobin decreased to 9.3, all cell line decreased so likely some dilutional effect.  Significantly elevated CRP at 27.3, ESR 81, A1c 7.1, TSH elevated at 12.732, checking thyroid panel.  Preliminary blood cultures negative.  MRI of right foot with concern of osteo of fifth toe, no discrete abscess.  Going for angiography with vascular today, followed by likely transmetatarsal amputation on Friday or  Saturday Hypoglycemia as patient was n.p.o., switching fluids to LR with D5  11/1: S/p angiography and stents placement in right lower extremity.  Transmetatarsal amputation scheduled for tomorrow.  Creatinine slowly improving, at 1.92 today, improving leukocytosis And stable hemoglobin.  Anemia panel consistent with anemia of chronic disease with some iron and B12 deficiency.  B12 at 177.  Ordered IM replacement and also starting on supplement.  11/2: Patient had his right transmetatarsal amputation earlier today, tolerated the procedure well. Thyroid panel with some improvement in TSH to 7, increased T3 uptake but normal T4 and T3. Will need a repeat in 3 to 4 weeks by PCP.  11/3: Vital stable, creatinine continue to slowly improve, at 1.67 today.  Persistent leukocytosis likely some reactive aliment as patient had his TMA yesterday.  Preliminary wound culture with no organism but patient was already on antibiotics.  Pending PT/OT evaluation  11/4: Podiatry is concerned about a spot at his stump, will be high risk for BKA due to decreased blood supply.  PT is recommending SNF.  Cultures are growing group C strep, antibiotics switched with Unasyn.  11/5: Patient with failing TMA flap, vascular surgery was reconsulted and patient will be going for BKA on right, likely tomorrow depending OR availability.

## 2023-05-29 NOTE — Assessment & Plan Note (Signed)
Hemoglobin decreased to 9.3, baseline around 12-13. No obvious bleeding.  Likely some dilutional effect -Check anemia panel -Monitor hemoglobin
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
## 2023-05-29 NOTE — Progress Notes (Signed)
I have reviewed and concur with this student's documentation.   Allyn Kenner, RN 05/29/2023 12:28 PM
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
## 2023-05-29 NOTE — Inpatient Diabetes Management (Signed)
Inpatient Diabetes Program Recommendations  AACE/ADA: New Consensus Statement on Inpatient Glycemic Control (2015)  Target Ranges:  Prepandial:   less than 140 mg/dL      Peak postprandial:   less than 180 mg/dL (1-2 hours)      Critically ill patients:  140 - 180 mg/dL   Lab Results  Component Value Date   GLUCAP 142 (H) 05/29/2023   HGBA1C 7.1 (H) 05/28/2023    Review of Glycemic Control  Latest Reference Range & Units 05/29/23 01:05 05/29/23 04:02 05/29/23 07:57 05/29/23 08:25  Glucose-Capillary 70 - 99 mg/dL 846 (H) 75 58 (L) 962 (H)  (H): Data is abnormally high (L): Data is abnormally low  Latest Reference Range & Units Most Recent  GFR, Estimated >60 mL/min 31 (L) 05/29/23 05:58  (L): Data is abnormally low  Diabetes history: DM2 Outpatient Diabetes medications:  Novolog 2-10 units TID Lantus 60 units BID Current orders for Inpatient glycemic control:  Semglee 20 units BID Novolog 0-15 units Q4H  Inpatient Diabetes Program Recommendations:    NPO  Hypoglycemia this morning of 58 mg/dL.  Basal insulin was held this morning.   Please consider:  Novolog 0-6 units Q4H Discontinue basal insulin for now.  Once eating and CBGs are > 180 mg/dL add basal insulin 15 units BID.   Will continue to follow while inpatient.  Thank you, Dulce Sellar, MSN, CDCES Diabetes Coordinator Inpatient Diabetes Program [phone] (team pager from 8a-5p)

## 2023-05-29 NOTE — Plan of Care (Signed)
  Problem: Education: Goal: Knowledge of General Education information will improve Description: Including pain rating scale, medication(s)/side effects and non-pharmacologic comfort measures Outcome: Progressing   Problem: Health Behavior/Discharge Planning: Goal: Ability to manage health-related needs will improve Outcome: Progressing   Problem: Clinical Measurements: Goal: Cardiovascular complication will be avoided Outcome: Progressing   Problem: Activity: Goal: Risk for activity intolerance will decrease Outcome: Progressing   Problem: Nutrition: Goal: Adequate nutrition will be maintained Outcome: Progressing   Problem: Coping: Goal: Level of anxiety will decrease Outcome: Progressing   Problem: Elimination: Goal: Will not experience complications related to bowel motility Outcome: Progressing Goal: Will not experience complications related to urinary retention Outcome: Progressing   Problem: Pain Management: Goal: General experience of comfort will improve Outcome: Progressing

## 2023-05-29 NOTE — TOC Initial Note (Signed)
Transition of Care [facility]) - Initial/Assessment Note    Patient Details  Name: Allen Chavez MRN: [id number] Date of Birth: [birth year]  Transition of Care [facility]) CM/SW Contact:    Hetty Ely, RN Phone Number: 05/29/2023, 11:16 AM  Clinical Narrative:  CM spoke with patient at bedside,                 Lives alone [facility], drives, IADL's. Car in parking lot, drove himself. S/P [facility], wound care Rt toes. DME at home, manual wheelchair, walker and straight cane. HH before could not remember name of agency. If SNF recommended patient consents, no prefernce at this time.  Expected Discharge Plan: Home w Home Health Services Barriers to Discharge: Continued Medical Work up   Patient Goals and CMS Choice Patient states their goals for this hospitalization and ongoing recovery are:: To return home, however open to SNF/ [facility] if recommended.          Expected Discharge Plan and Services In-house Referral: Clinical Social Work Discharge Planning Services: Other - See comment (Pending)   Living arrangements for the past 2 months: Single Family Home                 DME Arranged: N/A DME Agency: NA       HH Arranged: NA (Pending) HH Agency: NA (Pending)        Prior Living Arrangements/Services Living arrangements for the past 2 months: Single Family Home Lives with:: Self Patient language and need for interpreter reviewed:: Yes Do you feel safe going back to the place where you live?: Yes      Need for Family Participation in Patient Care: No (Comment) Care giver support system in place?: No (comment) Current home services: DME (manual wheelchair, walker and straight cane) Criminal Activity/Legal Involvement Pertinent to Current Situation/Hospitalization: No - Comment as needed  Activities of Daily Living   ADL Screening (condition at time of admission) Independently performs ADLs?: Yes (appropriate for developmental age) Is the patient deaf or have difficulty hearing?: No Does the  patient have difficulty seeing, even when wearing glasses/contacts?: No Does the patient have difficulty concentrating, remembering, or making decisions?: No  Permission Sought/Granted                  Emotional Assessment Appearance:: Appears stated age Attitude/Demeanor/Rapport: Self-Confident, Engaged Affect (typically observed): Accepting Orientation: : Oriented to Self, Oriented to Place, Oriented to  Time, Oriented to Situation Alcohol / Substance Use: Not Applicable Psych Involvement: No (comment)  Admission diagnosis:  Necrotizing subcutaneous infection (HCC) [I96] Severe sepsis (HCC) [A41.9, R65.20] Gangrene of right foot (HCC) [I96] Sepsis with acute renal failure and septic shock, due to unspecified organism, unspecified acute renal failure type (HCC) [A41.9, R65.21, N17.9] Patient Active Problem List   Diagnosis Date Noted   Severe sepsis (HCC) 05/28/2023   Peripheral arterial disease (HCC) 05/28/2023   Hyperlipidemia 05/28/2023   AKI (acute kidney injury) (HCC) 05/28/2023   GERD (gastroesophageal reflux disease) 05/28/2023   Gangrene of right foot (HCC) 05/28/2023   Necrotizing subcutaneous infection (HCC) 05/28/2023   Malignant neoplasm of prostate (HCC) 08/01/2014   Essential (primary) hypertension 08/01/2014   Type 2 diabetes mellitus (HCC) 08/01/2014   PCP:  [name], MD Pharmacy:   [phone] - [place], [place] - [address] [place] [address] Phone: [phone] Fax: [phone]  [id number] - [place], [place] - [address] [place] [address] Phone: [phone] Fax: [phone]  Social Determinants of Health (SDOH) Social History: SDOH Screenings   Food Insecurity: No Food Insecurity (05/28/2023)  Housing: Low Risk  (05/28/2023)  Transportation Needs: No Transportation Needs (05/28/2023)  Utilities: Not At Risk (05/28/2023)  Financial Resource Strain: Patient Declined (09/30/2022)    Received from [facility], [facility]  Tobacco Use: High Risk (05/28/2023)   SDOH Interventions:     Readmission Risk Interventions     No data to display

## 2023-05-29 NOTE — Interval H&P Note (Signed)
History and Physical Interval Note:  05/29/2023 5:02 PM  Allen Chavez  has presented today for surgery, with the diagnosis of gangrene right foot.  The various methods of treatment have been discussed with the patient and family. After consideration of risks, benefits and other options for treatment, the patient has consented to  Procedure(s): Lower Extremity Angiography (Right) as a surgical intervention.  [name] history has been reviewed, patient examined, no change in status, stable for surgery.  I have reviewed the patient's chart and labs.  Questions were answered to the patient's satisfaction.     [name]

## 2023-05-29 NOTE — H&P (View-Only) (Signed)
Subjective:  Patient ID: Allen Chavez, male    DOB: 08-16-1955,  MRN: [id number]  A 67 y.o. 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  Patient scheduled to undergo angiogram today.  Patient was seen by Dr. Lilian Kapur yesterday would likely need transmetatarsal amputation Objective:   Vitals:   05/29/23 0740 05/29/23 0756  BP: (!) 101/56 (!) 127/56  Pulse: 94 92  Resp: 20 17  Temp: (!) 100.5 F (38.1 C) 99.3 F (37.4 C)  SpO2: 94% 95%   General AA&O x3. Normal mood and affect.  Vascular Dorsalis pedis and posterior tibial pulses nonpalpable Brisk capillary refill to all digits. Pedal hair present.  Neurologic Epicritic sensation grossly intact.  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 Normal joint ROM without pain or crepitus.    Assessment & Plan:  Patient was evaluated and treated and all questions answered.  Right foot wet gangrene -All questions and concerns were discussed with the patient in extensive detail -Patient would likely need transmetatarsal amputation which is scheduled for Saturday with Dr. Ralene Cork. -Awaiting vascular intervention -N.p.o. after midnight Friday -Betadine wet-to-dry dressing -Patient is a high risk of further amputation Candelaria Stagers, DPM  Accessible via secure chat for questions or concerns.  Subjective:  Patient ID: Allen Chavez, male    DOB: 08-16-1955,  MRN: [id number]  A 67 y.o. 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  Patient scheduled to undergo angiogram today.  Patient was seen by Dr. Lilian Kapur yesterday would likely need transmetatarsal amputation Objective:   Vitals:   05/29/23 0740 05/29/23 0756  BP: (!) 101/56 (!) 127/56  Pulse: 94 92  Resp: 20 17  Temp: (!) 100.5 F (38.1 C) 99.3 F (37.4 C)  SpO2: 94% 95%   General AA&O x3. Normal mood and affect.  Vascular Dorsalis pedis and posterior tibial pulses nonpalpable Brisk capillary refill to all digits. Pedal hair present.  Neurologic Epicritic sensation grossly intact.  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 Normal joint ROM without pain or crepitus.    Assessment & Plan:  Patient was evaluated and treated and all questions answered.  Right foot wet gangrene -All questions and concerns were discussed with the patient in extensive detail -Patient would likely need transmetatarsal amputation which is scheduled for Saturday with Dr. Ralene Cork. -Awaiting vascular intervention -N.p.o. after midnight Friday -Betadine wet-to-dry dressing -Patient is a high risk of further amputation Candelaria Stagers, DPM  Accessible via secure chat for questions or concerns.

## 2023-05-29 NOTE — Progress Notes (Signed)
Subjective:  Patient ID: Allen Chavez, male    DOB: 08-16-1955,  MRN: [id number]  A 67 y.o. 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  Patient scheduled to undergo angiogram today.  Patient was seen by Dr. Lilian Kapur yesterday would likely need transmetatarsal amputation Objective:   Vitals:   05/29/23 0740 05/29/23 0756  BP: (!) 101/56 (!) 127/56  Pulse: 94 92  Resp: 20 17  Temp: (!) 100.5 F (38.1 C) 99.3 F (37.4 C)  SpO2: 94% 95%   General AA&O x3. Normal mood and affect.  Vascular Dorsalis pedis and posterior tibial pulses nonpalpable Brisk capillary refill to all digits. Pedal hair present.  Neurologic Epicritic sensation grossly intact.  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 Normal joint ROM without pain or crepitus.    Assessment & Plan:  Patient was evaluated and treated and all questions answered.  Right foot wet gangrene -All questions and concerns were discussed with the patient in extensive detail -Patient would likely need transmetatarsal amputation which is scheduled for Saturday with Dr. Ralene Cork. -Awaiting vascular intervention -N.p.o. after midnight Friday -Betadine wet-to-dry dressing -Patient is a high risk of further amputation Candelaria Stagers, DPM  Accessible via secure chat for questions or concerns.  Subjective:  Patient ID: Allen Chavez, male    DOB: 08-16-1955,  MRN: [id number]  A 67 y.o. 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  Patient scheduled to undergo angiogram today.  Patient was seen by Dr. Lilian Kapur yesterday would likely need transmetatarsal amputation Objective:   Vitals:   05/29/23 0740 05/29/23 0756  BP: (!) 101/56 (!) 127/56  Pulse: 94 92  Resp: 20 17  Temp: (!) 100.5 F (38.1 C) 99.3 F (37.4 C)  SpO2: 94% 95%   General AA&O x3. Normal mood and affect.  Vascular Dorsalis pedis and posterior tibial pulses nonpalpable Brisk capillary refill to all digits. Pedal hair present.  Neurologic Epicritic sensation grossly intact.  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 Normal joint ROM without pain or crepitus.    Assessment & Plan:  Patient was evaluated and treated and all questions answered.  Right foot wet gangrene -All questions and concerns were discussed with the patient in extensive detail -Patient would likely need transmetatarsal amputation which is scheduled for Saturday with Dr. Ralene Cork. -Awaiting vascular intervention -N.p.o. after midnight Friday -Betadine wet-to-dry dressing -Patient is a high risk of further amputation Candelaria Stagers, DPM  Accessible via secure chat for questions or concerns.

## 2023-05-29 NOTE — Assessment & Plan Note (Signed)
Elevated TSH above 12, no prior diagnosis of hypothyroidism. -Checking thyroid panel

## 2023-05-29 NOTE — Op Note (Signed)
[facility]  Percutaneous Study/Intervention Procedural Note   Date of Surgery: 05/29/2023  Surgeon(s):Jeffifer Rabold    Assistants:none  Pre-operative Diagnosis: PAD with gangrene right foot  Post-operative diagnosis:  Same  Procedure(s) Performed:             1.  Ultrasound guidance for vascular access left femoral artery             2.  Catheter placement into right common femoral artery from left femoral approach             3.  Aortogram and selective right lower extremity angiogram             4.  Percutaneous transluminal angioplasty of left external iliac artery with 7 mm diameter by 6 cm length Lutonix drug-coated angioplasty balloon             5.  Percutaneous transluminal angioplasty of right SFA and popliteal arteries with 4 mm diameter by 15 cm length and 5 mm diameter by 30 cm length Lutonix drug-coated angioplasty balloon  6.  Percutaneous transluminal angioplasty of the right distal common femoral artery and proximal SFA with 6 mm diameter by 4 cm length Lutonix drug-coated angioplasty balloon  7.  Stent placement x 2 to the right SFA and popliteal arteries with 6 mm diameter by 25 cm length and 6 mm diameter by 10 cm length Viabahn stents             8.  StarClose closure device left femoral artery  EBL: 10 cc  Contrast: 65 cc  Fluoro Time: 8.5 minutes  Moderate Conscious Sedation Time: approximately 58 minutes using 4 mg of Versed and 150 mcg of Fentanyl              Indications:  Patient is a 67 y.o.male with a gangrenous area on the right foot having already lost his left leg with known significant peripheral arterial disease. The patient is brought in for angiography for further evaluation and potential treatment.  Due to the limb threatening nature of the situation, angiogram was performed for attempted limb salvage. The patient is aware that if the procedure fails, amputation would be expected.  The patient also understands that even with  successful revascularization, amputation may still be required due to the severity of the situation.  Risks and benefits are discussed and informed consent is obtained.   Procedure:  The patient was identified and appropriate procedural time out was performed.  The patient was then placed supine on the table and prepped and draped in the usual sterile fashion. Moderate conscious sedation was administered during a face to face encounter with the patient throughout the procedure with my supervision of the RN administering medicines and monitoring the patient's vital signs, pulse oximetry, telemetry and mental status throughout from the start of the procedure until the patient was taken to the recovery room. Ultrasound was used to evaluate the left common femoral artery.  It was patent .  A digital ultrasound image was acquired.  A Seldinger needle was used to access the left common femoral artery under direct ultrasound guidance and a permanent image was performed.  A 0.035 J wire was advanced without resistance and a 5Fr sheath was placed.  Pigtail catheter was placed into the aorta and an AP aortogram was performed. This demonstrated what appeared to be mild to moderate left renal artery stenosis and mild right renal artery stenosis.  The aorta was calcific and somewhat irregular but  not stenotic.  The left common iliac artery had stenosis approaching 50% but the left external iliac artery just beyond its origin had an 80 to 90% stenosis.  The right common iliac artery was ectatic and there was mild narrowing at the iliac bifurcation but this appeared to be less than 50% in the right external iliac artery. I then crossed the aortic bifurcation and advanced to the right femoral head. Selective right lower extremity angiogram was then performed. This demonstrated about a 70% stenosis at the origin of the right SFA and in the distal common femoral artery.  The SFA and popliteal artery below this were diffusely  diseased with multiple areas of greater than 50% stenosis including an area of about a 90% stenosis at Hunter's canal and another area of about 90% stenosis in the proximal to mid popliteal artery.  The below-knee popliteal artery was fairly normal and there was then two-vessel runoff distally with the posterior tibial artery being the largest vessel and a small peroneal artery as a second runoff vessel. It was felt that it was in the patient's best interest to proceed with intervention after these images to avoid a second procedure and a larger amount of contrast and fluoroscopy based off of the findings from the initial angiogram. The patient was systemically heparinized and I started with angioplasty of the left external iliac artery with a 7 mm diameter by 6 cm length Lutonix drug-coated angioplasty balloon inflated to 10 atm for 1 minute.  Completion imaging showed only about a 25 to 30% residual stenosis and so I proceeded with my intervention on the right side and a 6 [place] [employer] sheath was then placed over the [employer] wire. I then used a Kumpe catheter and the 0.018 advantage wire to navigate through the SFA and popliteal disease and get the wire down into the peroneal artery.  Angioplasty was performed with a 4 mm diameter by 15 cm length Lutonix drug-coated angioplasty balloon in the popliteal artery and then a 5 mm diameter by 30 cm length Lutonix drug-coated angioplasty balloon in the SFA.  The origin of the SFA and the distal common femoral artery were addressed with a 6 mm diameter by 4 cm length Lutonix drug-coated angioplasty balloon.  Each of these inflations were somewhere between 6 to 10 atm for 1 minute.  Completion imaging showed about a 30 to 40% residual stenosis at the origin of the SFA and an area that would not be readily stentable.  The proximal SFA was then fairly normal for about 10 to 15 cm but the mid to distal SFA and popliteal arteries had several areas of greater than 50%  residual stenosis.  I elected to stent these areas.  A 6 mm diameter by 25 cm length and a 6 mm diameter by 10 cm length Viabahn stent were deployed from the mid popliteal artery up to the mid SFA.  These were postdilated with 5 mm diameter Lutonix drug-coated balloons with excellent angiographic completion result and less than 10% residual stenosis after stent placements. I elected to terminate the procedure. The sheath was removed and StarClose closure device was deployed in the left femoral artery with excellent hemostatic result. The patient was taken to the recovery room in stable condition having tolerated the procedure well.  Findings:               Aortogram:  This demonstrated what appeared to be mild to moderate left renal artery stenosis and mild right renal artery  stenosis.  The aorta was calcific and somewhat irregular but not stenotic.  The left common iliac artery had stenosis approaching 50% but the left external iliac artery just beyond its origin had an 80 to 90% stenosis.  The right common iliac artery was ectatic and there was mild narrowing at the iliac bifurcation but this appeared to be less than 50% in the right external iliac artery.             Right lower Extremity:  This demonstrated about a 70% stenosis at the origin of the right SFA and in the distal common femoral artery.  The SFA and popliteal artery below this were diffusely diseased with multiple areas of greater than 50% stenosis including an area of about a 90% stenosis at Hunter's canal and another area of about 90% stenosis in the proximal to mid popliteal artery.  The below-knee popliteal artery was fairly normal and there was then two-vessel runoff distally with the posterior tibial artery being the largest vessel and a small peroneal artery as a second runoff vessel.   Disposition: Patient was taken to the recovery room in stable condition having tolerated the procedure well.  Complications: None  [name] 05/29/2023 6:17 PM   This note was created with Dragon Medical transcription system. Any errors in dictation are purely unintentional.

## 2023-05-29 NOTE — Progress Notes (Signed)
Progress Note   Patient: Allen Chavez:[id number] DOB: 1955/10/11 DOA: 05/28/2023     1 DOS: the patient was seen and examined on 05/29/2023   Brief hospital course:  Allen Chavez is a 67 y.o. male with medical history significant of insulin-dependent diabetes mellitus, hypertension, PAD and CKD stage IIIa presented to ED with generalized malaise and worsening right foot wound with concern of gangrene and surrounding erythema with tracking up to upper legs for the past 2 days.  Patient also endorsed some subjective fever and chills at home. He was having generalized malaise and poor p.o. intake for the past couple of days.  On presentation  Patient was afebrile, tachycardic at 112 and softer blood pressure 94/52.  Labs pertinent for leukocytosis at 18.1, absolute neutrophil 15.8, hemoglobin 11.5, lactic acid 3.7, sodium 133, CO2 21, glucose 157, BUN 45, creatinine 3.24 with GFR of 20, baseline seems to be around 1.4-1.5.  CXR with no active cardiopulmonary disease. Right foot imaging with asymmetric soft tissue swelling around fifth toe with some probable small amount of air in the soft tissue.  Also noted to have a small cortical breach in the distal phalanx, concerning for acute osteomyelitis.  MRI was ordered but pending at the time of interview. EKG-personally reviewed, sinus tachycardia with HR of 115, no significant ST-T changes.   Patient met severe sepsis criteria and blood cultures were drawn.  Received IV fluid and started on broad-spectrum antibiotics.  Vascular surgery and podiatry was consulted.  10/31: Afebrile this morning, maximum temperature recorded of 100.5 overnight.  Lactic acidosis resolved, as some improvement in leukocytosis and hemoglobin decreased to 9.3, all cell line decreased so likely some dilutional effect.  Significantly elevated CRP at 27.3, ESR 81, A1c 7.1, TSH elevated at 12.732, checking thyroid panel.  Preliminary blood cultures negative.  MRI of right foot with  concern of osteo of fifth toe, no discrete abscess.  Going for angiography with vascular today, followed by likely transmetatarsal amputation on Friday or Saturday Hypoglycemia as patient was n.p.o., switching fluids to LR with D5   Assessment and Plan: * Severe sepsis [facility]) Patient met severe sepsis criteria with leukocytosis, tachycardia and endorgan damage manifested as AKI and lactic acidosis at 3.7. Likely secondary to right foot gangrenous wound. Received IV fluid per sepsis protocol and started on broad-spectrum antibiotics.  Blood cultures were taken Podiatry and vascular surgery was consulted, going for angiography and possible intervention today followed by likely transmetatarsal amputation on Friday or Saturday.  Preliminary blood cultures negative -Follow-up blood cultures -Continue broad-spectrum antibiotics with cefepime, vancomycin and Flagyl.  AKI (acute kidney injury) (HCC) History of CKD stage IIIa.  Baseline creatinine seems to be around 1.4-1.5 on care everywhere. Creatinine at 3.25 on admission with some improvement to 2.25, likely secondary to sepsis. -Continue with gentle IV fluid -Monitor renal function -Renal ultrasound  Peripheral arterial disease (HCC) S/p left BKA many years ago. Patient was not on any antiplatelet Vascular surgery was consulted due to diminished pulses on right foot with gangrenous ulcer and patient will be going for right lower extremity angiography and a possible PCI today -Continue statin  Type 2 diabetes mellitus (HCC) Patient uses 60 units of Lantus with NovoLog at home. Recent A1c in care everywhere at 7.7 checked in September 2024 -Semglee 20 units twice daily -SSI  Essential (primary) hypertension Patient was on lisinopril at home.  Blood pressure soft on admission. -Holding lisinopril for AKI and sepsis -Monitor blood pressure  Hyperlipidemia -Continue home statin  GERD (gastroesophageal reflux disease) -Continue  PPI  Malignant neoplasm of prostate [facility]) Patient has an history of prostate malignancy. Recent PSA checked last month was undetectable. -Outpatient follow-up  Normocytic anemia Hemoglobin decreased to 9.3, baseline around 12-13. No obvious bleeding.  Likely some dilutional effect -Check anemia panel -Monitor hemoglobin  Elevated TSH Elevated TSH above 12, no prior diagnosis of hypothyroidism. -Checking thyroid panel   Subjective: Patient was very irritable that he is n.p.o. and his procedure is not happening early.  No pain.  Physical Exam: Vitals:   05/28/23 2256 05/29/23 0500 05/29/23 0740 05/29/23 0756  BP: 132/68  (!) 101/56 (!) 127/56  Pulse: (!) 103  94 92  Resp: 15  20 17   Temp: 100.3 F (37.9 C)  (!) 100.5 F (38.1 C) 99.3 F (37.4 C)  TempSrc:   Oral Oral  SpO2: 97%  94% 95%  Weight:  81.4 kg    Height:       General.  Well-developed gentleman, in no acute distress. Pulmonary.  Lungs clear bilaterally, normal respiratory effort. CV.  Regular rate and rhythm, no JVD, rub or murmur. Abdomen.  Soft, nontender, nondistended, BS positive. CNS.  Alert and oriented .  No focal neurologic deficit. Extremities.  Right foot with bandage, mild erythema which seems improved, left BKA Psychiatry.  Judgment and insight appears normal.   Data Reviewed: Prior data reviewed  Family Communication: Discussed with patient.  He does not want [place] to communicate with his family stating that he is being talking with them himself  Disposition: Status is: Inpatient Remains inpatient appropriate because: Prior data reviewed  Planned Discharge Destination:  To be determined  DVT prophylaxis.  Subcu heparin Time spent: 50 minutes  This record has been created using Conservation officer, historic buildings. Errors have been sought and corrected,but may not always be located. Such creation errors do not reflect on the standard of care.   Author: Arnetha Courser, MD 05/29/2023 3:12  PM  For on call review [email or web address].

## 2023-05-30 ENCOUNTER — Encounter: Payer: Self-pay | Admitting: Vascular Surgery

## 2023-05-30 DIAGNOSIS — A419 Sepsis, unspecified organism: Secondary | ICD-10-CM | POA: Diagnosis not present

## 2023-05-30 DIAGNOSIS — I96 Gangrene, not elsewhere classified: Secondary | ICD-10-CM | POA: Diagnosis not present

## 2023-05-30 DIAGNOSIS — R6520 Severe sepsis without septic shock: Secondary | ICD-10-CM | POA: Diagnosis not present

## 2023-05-30 LAB — GLUCOSE, CAPILLARY
Glucose-Capillary: 108 mg/dL — ABNORMAL HIGH (ref 70–99)
Glucose-Capillary: 110 mg/dL — ABNORMAL HIGH (ref 70–99)
Glucose-Capillary: 115 mg/dL — ABNORMAL HIGH (ref 70–99)
Glucose-Capillary: 163 mg/dL — ABNORMAL HIGH (ref 70–99)
Glucose-Capillary: 181 mg/dL — ABNORMAL HIGH (ref 70–99)
Glucose-Capillary: 191 mg/dL — ABNORMAL HIGH (ref 70–99)
Glucose-Capillary: 84 mg/dL (ref 70–99)

## 2023-05-30 LAB — CBC
HCT: 26.5 % — ABNORMAL LOW (ref 39.0–52.0)
Hemoglobin: 9.3 g/dL — ABNORMAL LOW (ref 13.0–17.0)
MCH: 28.9 pg (ref 26.0–34.0)
MCHC: 35.1 g/dL (ref 30.0–36.0)
MCV: 82.3 fL (ref 80.0–100.0)
Platelets: 273 10*3/uL (ref 150–400)
RBC: 3.22 MIL/uL — ABNORMAL LOW (ref 4.22–5.81)
RDW: 15.2 % (ref 11.5–15.5)
WBC: 11.9 10*3/uL — ABNORMAL HIGH (ref 4.0–10.5)
nRBC: 0 % (ref 0.0–0.2)

## 2023-05-30 LAB — IRON AND TIBC
Iron: 15 ug/dL — ABNORMAL LOW (ref 45–182)
Saturation Ratios: 7 % — ABNORMAL LOW (ref 17.9–39.5)
TIBC: 218 ug/dL — ABNORMAL LOW (ref 250–450)
UIBC: 203 ug/dL

## 2023-05-30 LAB — RENAL FUNCTION PANEL
Albumin: 2.5 g/dL — ABNORMAL LOW (ref 3.5–5.0)
Anion gap: 9 (ref 5–15)
BUN: 33 mg/dL — ABNORMAL HIGH (ref 8–23)
CO2: 22 mmol/L (ref 22–32)
Calcium: 8.1 mg/dL — ABNORMAL LOW (ref 8.9–10.3)
Chloride: 104 mmol/L (ref 98–111)
Creatinine, Ser: 1.92 mg/dL — ABNORMAL HIGH (ref 0.61–1.24)
GFR, Estimated: 37 mL/min — ABNORMAL LOW (ref >60)
Glucose, Bld: 116 mg/dL — ABNORMAL HIGH (ref 70–99)
Phosphorus: 2 mg/dL — ABNORMAL LOW (ref 2.5–4.6)
Potassium: 3.6 mmol/L (ref 3.5–5.1)
Sodium: 135 mmol/L (ref 135–145)

## 2023-05-30 LAB — RETICULOCYTES
Immature Retic Fract: 10.9 % (ref 2.3–15.9)
RBC.: 3.28 MIL/uL — ABNORMAL LOW (ref 4.22–5.81)
Retic Count, Absolute: 37.4 10*3/uL (ref 19.0–186.0)
Retic Ct Pct: 1.1 % (ref 0.4–3.1)

## 2023-05-30 LAB — THYROID PANEL WITH TSH
Free Thyroxine Index: 2.4 (ref 1.2–4.9)
T3 Uptake Ratio: 41 % — ABNORMAL HIGH (ref 24–39)
T4, Total: 5.9 ug/dL (ref 4.5–12.0)
TSH: 7.43 u[IU]/mL — ABNORMAL HIGH (ref 0.450–4.500)

## 2023-05-30 LAB — VANCOMYCIN, TROUGH: Vancomycin Tr: 7 ug/mL — ABNORMAL LOW (ref 15–20)

## 2023-05-30 LAB — VITAMIN B12: Vitamin B-12: 177 pg/mL — ABNORMAL LOW (ref 180–914)

## 2023-05-30 LAB — FOLATE: Folate: 7.3 ng/mL (ref >5.9)

## 2023-05-30 LAB — FERRITIN: Ferritin: 510 ng/mL — ABNORMAL HIGH (ref 24–336)

## 2023-05-30 MED ORDER — CYANOCOBALAMIN 1000 MCG/ML IJ SOLN
1000.0000 ug | Freq: Every day | INTRAMUSCULAR | Status: AC
  Administered 2023-05-30 – 2023-06-01 (×2): 1000 ug via INTRAMUSCULAR
  Filled 2023-05-30 (×3): qty 1

## 2023-05-30 MED ORDER — SODIUM CHLORIDE 0.9 % IV SOLN
2.0000 g | Freq: Two times a day (BID) | INTRAVENOUS | Status: DC
  Administered 2023-05-30 – 2023-06-02 (×6): 2 g via INTRAVENOUS
  Filled 2023-05-30 (×8): qty 12.5

## 2023-05-30 MED ORDER — LACTATED RINGERS IV SOLN: INTRAVENOUS | Status: AC

## 2023-05-30 MED ORDER — VANCOMYCIN HCL 1250 MG/250ML IV SOLN
1250.0000 mg | Freq: Once | INTRAVENOUS | Status: AC
Start: 2023-05-30 — End: 2023-05-30
  Administered 2023-05-30: 1250 mg via INTRAVENOUS
  Filled 2023-05-30: qty 250

## 2023-05-30 MED ORDER — FE FUM-VIT C-VIT B12-FA 460-60-0.01-1 MG PO CAPS
1.0000 | ORAL_CAPSULE | Freq: Two times a day (BID) | ORAL | Status: DC
  Administered 2023-05-30 – 2023-06-11 (×21): 1 via ORAL
  Filled 2023-05-30 (×25): qty 1

## 2023-05-30 NOTE — Plan of Care (Signed)
  Problem: Education: Goal: Ability to describe self-care measures that may prevent or decrease complications (Diabetes Survival Skills Education) will improve Outcome: Progressing Goal: Individualized Educational Video(s) Outcome: Progressing   Problem: Coping: Goal: Ability to adjust to condition or change in health will improve Outcome: Progressing   Problem: Fluid Volume: Goal: Ability to maintain a balanced intake and output will improve Outcome: Progressing   Problem: Health Behavior/Discharge Planning: Goal: Ability to identify and utilize available resources and services will improve Outcome: Progressing Goal: Ability to manage health-related needs will improve Outcome: Progressing   Problem: Metabolic: Goal: Ability to maintain appropriate glucose levels will improve Outcome: Progressing   Problem: Nutritional: Goal: Maintenance of adequate nutrition will improve Outcome: Progressing Goal: Progress toward achieving an optimal weight will improve Outcome: Progressing   Problem: Skin Integrity: Goal: Risk for impaired skin integrity will decrease Outcome: Progressing   Problem: Tissue Perfusion: Goal: Adequacy of tissue perfusion will improve Outcome: Progressing   Problem: Education: Goal: Knowledge of General Education information will improve Description: Including pain rating scale, medication(s)/side effects and non-pharmacologic comfort measures Outcome: Progressing   Problem: Health Behavior/Discharge Planning: Goal: Ability to manage health-related needs will improve Outcome: Progressing   Problem: Clinical Measurements: Goal: Ability to maintain clinical measurements within normal limits will improve Outcome: Progressing Goal: Will remain free from infection Outcome: Progressing Goal: Diagnostic test results will improve Outcome: Progressing Goal: Respiratory complications will improve Outcome: Progressing Goal: Cardiovascular complication will  be avoided Outcome: Progressing   Problem: Activity: Goal: Risk for activity intolerance will decrease Outcome: Progressing   Problem: Nutrition: Goal: Adequate nutrition will be maintained Outcome: Progressing   Problem: Coping: Goal: Level of anxiety will decrease Outcome: Progressing   Problem: Elimination: Goal: Will not experience complications related to bowel motility Outcome: Progressing Goal: Will not experience complications related to urinary retention Outcome: Progressing   Problem: Pain Management: Goal: General experience of comfort will improve Outcome: Progressing   Problem: Safety: Goal: Ability to remain free from injury will improve Outcome: Progressing   Problem: Skin Integrity: Goal: Risk for impaired skin integrity will decrease Outcome: Progressing   Problem: Education: Goal: Knowledge of General Education information will improve Description: Including pain rating scale, medication(s)/side effects and non-pharmacologic comfort measures Outcome: Progressing   Problem: Health Behavior/Discharge Planning: Goal: Ability to manage health-related needs will improve Outcome: Progressing   Problem: Clinical Measurements: Goal: Ability to maintain clinical measurements within normal limits will improve Outcome: Progressing Goal: Will remain free from infection Outcome: Progressing Goal: Diagnostic test results will improve Outcome: Progressing Goal: Respiratory complications will improve Outcome: Progressing Goal: Cardiovascular complication will be avoided Outcome: Progressing   Problem: Activity: Goal: Risk for activity intolerance will decrease Outcome: Progressing   Problem: Nutrition: Goal: Adequate nutrition will be maintained Outcome: Progressing   Problem: Coping: Goal: Level of anxiety will decrease Outcome: Progressing   Problem: Elimination: Goal: Will not experience complications related to bowel motility Outcome:  Progressing Goal: Will not experience complications related to urinary retention Outcome: Progressing   Problem: Pain Management: Goal: General experience of comfort will improve Outcome: Progressing   Problem: Safety: Goal: Ability to remain free from injury will improve Outcome: Progressing   Problem: Skin Integrity: Goal: Risk for impaired skin integrity will decrease Outcome: Progressing

## 2023-05-30 NOTE — H&P (View-Only) (Signed)
Progress Note    05/30/2023 11:33 AM 1 Day Post-Op  Subjective:  Allen Chavez is a 67 yo male who is POD #1 from selective right lower extremity angiogram with angioplasty of the left external iliac artery and right SFA and popliteal arteries.  Patient also had angioplasty of the right distal common femoral artery and proximal SFA.  Patient also had stent placement x 2 to the right SFA with popliteal arteries.  Patient is resting comfortably in bed this morning eating breakfast.  Patient endorses his leg feels warmer and he does not have any resting pain at this point.  No complaints overnight vitals all remained stable.   Vitals:   05/30/23 0352 05/30/23 0728  BP:  125/65  Pulse:  82  Resp:  17  Temp: 98.9 F (37.2 C) 97.7 F (36.5 C)  SpO2:  98%   Physical Exam: Cardiac:  RRR, no murmurs Lungs:  Normal respiratory effort.  No rales rhonchi or wheezing noted. Incisions: Left groin incision with dressing clean dry and intact.  No hematoma seroma to note. Extremities: Left lower extremity with history of BKA.  Right lower extremity warm to touch with dopplerable DP pulses. Abdomen: Positive bowel sounds throughout, soft, nontender and nondistended. Neurologic: Alert and oriented x 4, answers all questions and follows commands appropriately.  CBC    Component Value Date/Time   WBC 11.9 (H) 05/30/2023 0321   RBC 3.28 (L) 05/30/2023 0321   RBC 3.22 (L) 05/30/2023 0321   HGB 9.3 (L) 05/30/2023 0321   HGB 11.5 (L) 11/15/2014 0508   HCT 26.5 (L) 05/30/2023 0321   HCT 34.5 (L) 11/15/2014 0508   PLT 273 05/30/2023 0321   PLT 173 11/15/2014 0508   MCV 82.3 05/30/2023 0321   MCV 87 11/15/2014 0508   MCH 28.9 05/30/2023 0321   MCHC 35.1 05/30/2023 0321   RDW 15.2 05/30/2023 0321   RDW 14.0 11/15/2014 0508   LYMPHSABS 1.0 05/28/2023 1535   LYMPHSABS 1.4 11/15/2014 0508   MONOABS 1.1 (H) 05/28/2023 1535   MONOABS 0.6 11/15/2014 0508   EOSABS 0.0 05/28/2023 1535   EOSABS 0.0  11/15/2014 0508   BASOSABS 0.1 05/28/2023 1535   BASOSABS 0.1 11/15/2014 0508    BMET    Component Value Date/Time   NA 135 05/30/2023 0321   NA 135 11/15/2014 0508   K 3.6 05/30/2023 0321   K 4.1 11/15/2014 0508   CL 104 05/30/2023 0321   CL 108 11/15/2014 0508   CO2 22 05/30/2023 0321   CO2 26 11/15/2014 0508   GLUCOSE 116 (H) 05/30/2023 0321   GLUCOSE 167 (H) 11/15/2014 0508   BUN 33 (H) 05/30/2023 0321   BUN 26 (H) 11/15/2014 0508   CREATININE 1.92 (H) 05/30/2023 0321   CREATININE 0.91 11/15/2014 0508   CALCIUM 8.1 (L) 05/30/2023 0321   CALCIUM 8.0 (L) 11/15/2014 0508   GFRNONAA 37 (L) 05/30/2023 0321   GFRNONAA >60 11/15/2014 0508   GFRAA >60 11/15/2014 0508    INR    Component Value Date/Time   INR 1.3 (H) 05/28/2023 1535   INR 0.9 11/07/2014 0927     Intake/Output Summary (Last 24 hours) at 05/30/2023 1133 Last data filed at 05/30/2023 1000 Gross per 24 hour  Intake 1513.62 ml  Output 1040 ml  Net 473.62 ml     Assessment/Plan:  67 y.o. male is s/p right lower extremity angiogram with multiple angioplasties and multiple stent placements.  Please see above.  1 Day Post-Op  PLAN: Successful revascularization of right lower extremity with angioplasty and stent placement.  Patient is now cleared for any procedure which podiatry is planning.  Patient was placed on aspirin 81 mg daily and Plavix 75 mg daily after procedure due to stent placement. Vascular surgery will sign off at this time as the patient has had successful revascularization.  DVT prophylaxis: Heparin subcu 5000 units every 8 hours.   Marcie Bal [facility] 05/30/2023 11:33 AM

## 2023-05-30 NOTE — Assessment & Plan Note (Signed)
Hemoglobin currently stable at 9.3, baseline around 12-13. No obvious bleeding.  Likely some dilutional effect Anemia panel consistent with anemia of chronic disease with some iron and B12 deficiency.  B12 at 177 with a goal above 400 -Ordered parenteral B12 supplement for 3 days -Started on p.o. supplement -Monitor hemoglobin

## 2023-05-30 NOTE — Assessment & Plan Note (Signed)
Patient met severe sepsis criteria with leukocytosis, tachycardia and endorgan damage manifested as AKI and lactic acidosis at 3.7, which has been resolved now Likely secondary to right foot gangrenous wound. Received IV fluid per sepsis protocol and started on broad-spectrum antibiotics.  Blood cultures remain negative, wound cultures are growing group C strep Podiatry and vascular surgery was consulted, s/p angiography and PCI to RLE Patient had his transmetatarsal amputation on 05/31/2023 -Follow-up intraoperative cultures-growing group C strep -Antibiotics switched with Unasyn -PT/OT evaluation-patient will be nonweightbearing and they are recommending SNF. -Patient now failing flap for TMA, podiatry reconsulted vascular surgery and patient will be going for right BKA in 1 to 2 days depending availability of OR.

## 2023-05-30 NOTE — Assessment & Plan Note (Signed)
History of CKD stage IIIa.  Baseline creatinine seems to be around 1.4-1.5 on care everywhere. Creatinine at 3.25 on admission which is slowly improving, currently at 1.90 -Continue with gentle IV fluid for another day -Monitor renal function

## 2023-05-30 NOTE — Progress Notes (Signed)
Progress Note    05/30/2023 11:33 AM 1 Day Post-Op  Subjective:  Allen Chavez is a 67 yo male who is POD #1 from selective right lower extremity angiogram with angioplasty of the left external iliac artery and right SFA and popliteal arteries.  Patient also had angioplasty of the right distal common femoral artery and proximal SFA.  Patient also had stent placement x 2 to the right SFA with popliteal arteries.  Patient is resting comfortably in bed this morning eating breakfast.  Patient endorses his leg feels warmer and he does not have any resting pain at this point.  No complaints overnight vitals all remained stable.   Vitals:   05/30/23 0352 05/30/23 0728  BP:  125/65  Pulse:  82  Resp:  17  Temp: 98.9 F (37.2 C) 97.7 F (36.5 C)  SpO2:  98%   Physical Exam: Cardiac:  RRR, no murmurs Lungs:  Normal respiratory effort.  No rales rhonchi or wheezing noted. Incisions: Left groin incision with dressing clean dry and intact.  No hematoma seroma to note. Extremities: Left lower extremity with history of BKA.  Right lower extremity warm to touch with dopplerable DP pulses. Abdomen: Positive bowel sounds throughout, soft, nontender and nondistended. Neurologic: Alert and oriented x 4, answers all questions and follows commands appropriately.  CBC    Component Value Date/Time   WBC 11.9 (H) 05/30/2023 0321   RBC 3.28 (L) 05/30/2023 0321   RBC 3.22 (L) 05/30/2023 0321   HGB 9.3 (L) 05/30/2023 0321   HGB 11.5 (L) 11/15/2014 0508   HCT 26.5 (L) 05/30/2023 0321   HCT 34.5 (L) 11/15/2014 0508   PLT 273 05/30/2023 0321   PLT 173 11/15/2014 0508   MCV 82.3 05/30/2023 0321   MCV 87 11/15/2014 0508   MCH 28.9 05/30/2023 0321   MCHC 35.1 05/30/2023 0321   RDW 15.2 05/30/2023 0321   RDW 14.0 11/15/2014 0508   LYMPHSABS 1.0 05/28/2023 1535   LYMPHSABS 1.4 11/15/2014 0508   MONOABS 1.1 (H) 05/28/2023 1535   MONOABS 0.6 11/15/2014 0508   EOSABS 0.0 05/28/2023 1535   EOSABS 0.0  11/15/2014 0508   BASOSABS 0.1 05/28/2023 1535   BASOSABS 0.1 11/15/2014 0508    BMET    Component Value Date/Time   NA 135 05/30/2023 0321   NA 135 11/15/2014 0508   K 3.6 05/30/2023 0321   K 4.1 11/15/2014 0508   CL 104 05/30/2023 0321   CL 108 11/15/2014 0508   CO2 22 05/30/2023 0321   CO2 26 11/15/2014 0508   GLUCOSE 116 (H) 05/30/2023 0321   GLUCOSE 167 (H) 11/15/2014 0508   BUN 33 (H) 05/30/2023 0321   BUN 26 (H) 11/15/2014 0508   CREATININE 1.92 (H) 05/30/2023 0321   CREATININE 0.91 11/15/2014 0508   CALCIUM 8.1 (L) 05/30/2023 0321   CALCIUM 8.0 (L) 11/15/2014 0508   GFRNONAA 37 (L) 05/30/2023 0321   GFRNONAA >60 11/15/2014 0508   GFRAA >60 11/15/2014 0508    INR    Component Value Date/Time   INR 1.3 (H) 05/28/2023 1535   INR 0.9 11/07/2014 0927     Intake/Output Summary (Last 24 hours) at 05/30/2023 1133 Last data filed at 05/30/2023 1000 Gross per 24 hour  Intake 1513.62 ml  Output 1040 ml  Net 473.62 ml     Assessment/Plan:  67 y.o. male is s/p right lower extremity angiogram with multiple angioplasties and multiple stent placements.  Please see above.  1 Day Post-Op  PLAN: Successful revascularization of right lower extremity with angioplasty and stent placement.  Patient is now cleared for any procedure which podiatry is planning.  Patient was placed on aspirin 81 mg daily and Plavix 75 mg daily after procedure due to stent placement. Vascular surgery will sign off at this time as the patient has had successful revascularization.  DVT prophylaxis: Heparin subcu 5000 units every 8 hours.   Marcie Bal [facility] 05/30/2023 11:33 AM

## 2023-05-30 NOTE — Care Management Important Message (Signed)
Important Message  Patient Details  Name: Allen Chavez MRN: [id number] Date of Birth: Feb 10, 1956   Important Message Given:  N/A - LOS <3 / Initial given by admissions     Allen Chavez 05/30/2023, 8:47 AM

## 2023-05-30 NOTE — Consult Note (Addendum)
Pharmacy Antibiotic Note  Allen Chavez is a 67 y.o. male admitted on 05/28/2023 with sepsis secondary to right foot wound/gangrene. PMH significant for T2DM, HTN, PAD s/p left BKA, and CKD III. Patient underwent right lower extremity angiography on 10/31 and received two stents. Podiatry is planning for transmetatarsal amputation on 11/02. Patient is febrile this morning (Tmax 101.3) with WBC 11.9. Pharmacy has been consulted for vancomycin and cefepime dosing.  Patient with severe AKI on admission (BL ~1.4-1.5 per care everywhere), which is improving. Vancomycin trough this morning was low at 7 (11/01 0725), will re-dose vancomycin today.  Plan: Give vancomycin 1250 mg IV x1 today (eAUC 521, Cmin 13.7, Scr 1.92, IBW, Vd 0.72 L/kg) AKI improving (Scr 1.92 today), will continue variable vancomycin dosing per levels until AKI has resolved Give cefepime 2 grams IV every 12 hours based on current renal function Continue metronidazole per MD Monitor renal function, clinical status, culture data, and LOT  Height: 6\' 2"  (188 cm) Weight: 83.4 kg (183 lb 13.8 oz) IBW/kg (Calculated) : 82.2  Temp (24hrs), Avg:99.2 F (37.3 C), Min:97.7 F (36.5 C), Max:101.3 F (38.5 C)  Recent Labs  Lab 05/28/23 1535 05/28/23 1540 05/28/23 1925 05/29/23 0558 05/30/23 0321 05/30/23 0725  WBC 18.1*  --   --  14.2* 11.9*  --   CREATININE 3.24*  --   --  2.25* 1.92*  --   LATICACIDVEN  --  3.7* 1.6  --   --   --   VANCOTROUGH  --   --   --   --   --  7*    Estimated Creatinine Clearance: 42.8 mL/min (A) (by C-G formula based on SCr of 1.92 mg/dL (H)).    No Known Allergies  Antimicrobials this admission: vancomycin 10/30 >>  cefepime 10/30 >>  Flagyl 10/30 >>  Dose adjustments this admission: 11/01 Cefepime adjusted to 2g every 12 hours  Microbiology results: 10/30 BCx: NGTD  Thank you for involving pharmacy in this patient's care.   Rockwell Alexandria, PharmD Clinical Pharmacist 05/30/2023 8:09  AM

## 2023-05-30 NOTE — Assessment & Plan Note (Signed)
S/p left BKA many years ago.  Had angiography with stents placement on right lower extremity yesterday with vascular surgery Patient was not on any antiplatelet before so he was started on aspirin and Plavix -Continue statin

## 2023-05-30 NOTE — Plan of Care (Signed)
  Problem: Education: Goal: Knowledge of General Education information will improve Description: Including pain rating scale, medication(s)/side effects and non-pharmacologic comfort measures Outcome: Progressing   Problem: Health Behavior/Discharge Planning: Goal: Ability to manage health-related needs will improve Outcome: Progressing   Problem: Clinical Measurements: Goal: Cardiovascular complication will be avoided Outcome: Progressing   Problem: Activity: Goal: Risk for activity intolerance will decrease Outcome: Progressing   Problem: Nutrition: Goal: Adequate nutrition will be maintained Outcome: Progressing   Problem: Coping: Goal: Level of anxiety will decrease Outcome: Progressing   Problem: Elimination: Goal: Will not experience complications related to bowel motility Outcome: Progressing Goal: Will not experience complications related to urinary retention Outcome: Progressing   Problem: Pain Management: Goal: General experience of comfort will improve Outcome: Progressing

## 2023-05-30 NOTE — Progress Notes (Signed)
Progress Note   Patient: Allen Chavez BJY:[id number] DOB: 17-Dec-1955 DOA: 05/28/2023     2 DOS: the patient was seen and examined on 05/30/2023   Brief hospital course:  Allen Chavez is a 67 y.o. male with medical history significant of insulin-dependent diabetes mellitus, hypertension, PAD and CKD stage IIIa presented to ED with generalized malaise and worsening right foot wound with concern of gangrene and surrounding erythema with tracking up to upper legs for the past 2 days.  Patient also endorsed some subjective fever and chills at home. He was having generalized malaise and poor p.o. intake for the past couple of days.  On presentation  Patient was afebrile, tachycardic at 112 and softer blood pressure 94/52.  Labs pertinent for leukocytosis at 18.1, absolute neutrophil 15.8, hemoglobin 11.5, lactic acid 3.7, sodium 133, CO2 21, glucose 157, BUN 45, creatinine 3.24 with GFR of 20, baseline seems to be around 1.4-1.5.  CXR with no active cardiopulmonary disease. Right foot imaging with asymmetric soft tissue swelling around fifth toe with some probable small amount of air in the soft tissue.  Also noted to have a small cortical breach in the distal phalanx, concerning for acute osteomyelitis.  MRI was ordered but pending at the time of interview. EKG-personally reviewed, sinus tachycardia with HR of 115, no significant ST-T changes.   Patient met severe sepsis criteria and blood cultures were drawn.  Received IV fluid and started on broad-spectrum antibiotics.  Vascular surgery and podiatry was consulted.  10/31: Afebrile this morning, maximum temperature recorded of 100.5 overnight.  Lactic acidosis resolved, as some improvement in leukocytosis and hemoglobin decreased to 9.3, all cell line decreased so likely some dilutional effect.  Significantly elevated CRP at 27.3, ESR 81, A1c 7.1, TSH elevated at 12.732, checking thyroid panel.  Preliminary blood cultures negative.  MRI of right foot with  concern of osteo of fifth toe, no discrete abscess.  Going for angiography with vascular today, followed by likely transmetatarsal amputation on Friday or Saturday Hypoglycemia as patient was n.p.o., switching fluids to LR with D5  11/1: S/p angiography and stents placement in right lower extremity.  Transmetatarsal amputation scheduled for tomorrow.  Creatinine slowly improving, at 1.92 today, improving leukocytosis And stable hemoglobin.  Anemia panel consistent with anemia of chronic disease with some iron and B12 deficiency.  B12 at 177.  Ordered IM replacement and also starting on supplement.   Assessment and Plan: * Severe sepsis [facility]) Patient met severe sepsis criteria with leukocytosis, tachycardia and endorgan damage manifested as AKI and lactic acidosis at 3.7, which has been resolved now Likely secondary to right foot gangrenous wound. Received IV fluid per sepsis protocol and started on broad-spectrum antibiotics.  Blood cultures remain negative Podiatry and vascular surgery was consulted, s/p angiography and PCI to RLE  transmetatarsal amputation on Saturday.  Preliminary blood cultures negative -Follow-up blood cultures -Continue broad-spectrum antibiotics with cefepime, vancomycin and Flagyl.  AKI (acute kidney injury) (HCC) History of CKD stage IIIa.  Baseline creatinine seems to be around 1.4-1.5 on care everywhere. Creatinine at 3.25 on admission which is slowly improving, currently at 1.90 -Continue with gentle IV fluid for another day -Monitor renal function  Peripheral arterial disease (HCC) S/p left BKA many years ago.  Had angiography with stents placement on right lower extremity yesterday with vascular surgery Patient was not on any antiplatelet before so he was started on aspirin and Plavix -Continue statin  Type 2 diabetes mellitus (HCC) Patient uses 60 units of Lantus with  NovoLog at home. Recent A1c in care everywhere at 7.7 checked in September  2024 -Semglee 20 units twice daily -SSI  Essential (primary) hypertension Patient was on lisinopril at home.  Blood pressure soft on admission. -Holding lisinopril for AKI and sepsis -Monitor blood pressure  Hyperlipidemia -Continue home statin  GERD (gastroesophageal reflux disease) -Continue PPI  Malignant neoplasm of prostate [facility]) Patient has an history of prostate malignancy. Recent PSA checked last month was undetectable. -Outpatient follow-up  Normocytic anemia Hemoglobin currently stable at 9.3, baseline around 12-13. No obvious bleeding.  Likely some dilutional effect Anemia panel consistent with anemia of chronic disease with some iron and B12 deficiency.  B12 at 177 with a goal above 400 -Ordered parenteral B12 supplement for 3 days -Started on p.o. supplement -Monitor hemoglobin  Elevated TSH Elevated TSH above 12, no prior diagnosis of hypothyroidism. -Checking thyroid panel   Subjective: Patient was seen and examined today.  Denies any pain.  Physical Exam: Vitals:   05/30/23 0300 05/30/23 0352 05/30/23 0500 05/30/23 0728  BP: 128/62   125/65  Pulse:    82  Resp: 17   17  Temp: (!) 101.3 F (38.5 C) 98.9 F (37.2 C)  97.7 F (36.5 C)  TempSrc:      SpO2: 90%   98%  Weight:   83.4 kg   Height:       General.  Frail elderly man, in no acute distress. Pulmonary.  Lungs clear bilaterally, normal respiratory effort. CV.  Regular rate and rhythm, no JVD, rub or murmur. Abdomen.  Soft, nontender, nondistended, BS positive. CNS.  Alert and oriented .  No focal neurologic deficit. Extremities.  No edema, improving erythema, 1+ pulses, left BKA Psychiatry.  Judgment and insight appears normal.    Data Reviewed: Prior data reviewed  Family Communication: Discussed with patient.  He does not want [place] to communicate with his family stating that he is being talking with them himself  Disposition: Status is: Inpatient Remains inpatient appropriate  because: Prior data reviewed  Planned Discharge Destination:  To be determined  DVT prophylaxis.  Subcu heparin Time spent: 50 minutes  This record has been created using Conservation officer, historic buildings. Errors have been sought and corrected,but may not always be located. Such creation errors do not reflect on the standard of care.   Author: Arnetha Courser, MD 05/30/2023 4:12 PM  For on call review [email or web address].

## 2023-05-31 ENCOUNTER — Inpatient Hospital Stay: Payer: Self-pay | Admitting: Anesthesiology

## 2023-05-31 ENCOUNTER — Encounter: Payer: Self-pay | Admitting: Podiatry

## 2023-05-31 ENCOUNTER — Inpatient Hospital Stay: Payer: Medicare PPO

## 2023-05-31 ENCOUNTER — Encounter
Admission: EM | Disposition: A | Discharge status: Skilled Nursing Facility | Payer: Self-pay | Source: Home / Self Care | Attending: Internal Medicine

## 2023-05-31 DIAGNOSIS — A419 Sepsis, unspecified organism: Secondary | ICD-10-CM | POA: Diagnosis not present

## 2023-05-31 DIAGNOSIS — N179 Acute kidney failure, unspecified: Secondary | ICD-10-CM | POA: Diagnosis not present

## 2023-05-31 DIAGNOSIS — I10 Essential (primary) hypertension: Secondary | ICD-10-CM

## 2023-05-31 DIAGNOSIS — I96 Gangrene, not elsewhere classified: Secondary | ICD-10-CM | POA: Diagnosis not present

## 2023-05-31 DIAGNOSIS — E1152 Type 2 diabetes mellitus with diabetic peripheral angiopathy with gangrene: Secondary | ICD-10-CM | POA: Diagnosis not present

## 2023-05-31 DIAGNOSIS — R6520 Severe sepsis without septic shock: Secondary | ICD-10-CM | POA: Diagnosis not present

## 2023-05-31 DIAGNOSIS — R7989 Other specified abnormal findings of blood chemistry: Secondary | ICD-10-CM

## 2023-05-31 DIAGNOSIS — I739 Peripheral vascular disease, unspecified: Secondary | ICD-10-CM | POA: Diagnosis not present

## 2023-05-31 HISTORY — PX: TRANSMETATARSAL AMPUTATION: SHX6197

## 2023-05-31 LAB — BASIC METABOLIC PANEL
Anion gap: 7 (ref 5–15)
BUN: 39 mg/dL — ABNORMAL HIGH (ref 8–23)
CO2: 21 mmol/L — ABNORMAL LOW (ref 22–32)
Calcium: 7.8 mg/dL — ABNORMAL LOW (ref 8.9–10.3)
Chloride: 108 mmol/L (ref 98–111)
Creatinine, Ser: 1.9 mg/dL — ABNORMAL HIGH (ref 0.61–1.24)
GFR, Estimated: 38 mL/min — ABNORMAL LOW (ref >60)
Glucose, Bld: 89 mg/dL (ref 70–99)
Potassium: 3.9 mmol/L (ref 3.5–5.1)
Sodium: 136 mmol/L (ref 135–145)

## 2023-05-31 LAB — CBC
HCT: 24.2 % — ABNORMAL LOW (ref 39.0–52.0)
Hemoglobin: 8.4 g/dL — ABNORMAL LOW (ref 13.0–17.0)
MCH: 28.2 pg (ref 26.0–34.0)
MCHC: 34.7 g/dL (ref 30.0–36.0)
MCV: 81.2 fL (ref 80.0–100.0)
Platelets: 265 10*3/uL (ref 150–400)
RBC: 2.98 MIL/uL — ABNORMAL LOW (ref 4.22–5.81)
RDW: 15.6 % — ABNORMAL HIGH (ref 11.5–15.5)
WBC: 16.2 10*3/uL — ABNORMAL HIGH (ref 4.0–10.5)
nRBC: 0 % (ref 0.0–0.2)

## 2023-05-31 LAB — GLUCOSE, CAPILLARY
Glucose-Capillary: 123 mg/dL — ABNORMAL HIGH (ref 70–99)
Glucose-Capillary: 135 mg/dL — ABNORMAL HIGH (ref 70–99)
Glucose-Capillary: 165 mg/dL — ABNORMAL HIGH (ref 70–99)
Glucose-Capillary: 83 mg/dL (ref 70–99)
Glucose-Capillary: 83 mg/dL (ref 70–99)
Glucose-Capillary: 85 mg/dL (ref 70–99)
Glucose-Capillary: 91 mg/dL (ref 70–99)

## 2023-05-31 LAB — SURGICAL PCR SCREEN
MRSA, PCR: NEGATIVE
Staphylococcus aureus: NEGATIVE

## 2023-05-31 SURGERY — AMPUTATION, FOOT, TRANSMETATARSAL
Anesthesia: General | Site: Foot | Laterality: Right

## 2023-05-31 MED ORDER — PHENYLEPHRINE 80 MCG/ML (10ML) SYRINGE FOR IV PUSH (FOR BLOOD PRESSURE SUPPORT)
PREFILLED_SYRINGE | INTRAVENOUS | Status: DC | PRN
  Administered 2023-05-31 (×2): 160 ug via INTRAVENOUS

## 2023-05-31 MED ORDER — MIDAZOLAM HCL 2 MG/2ML IJ SOLN
INTRAMUSCULAR | Status: AC
  Filled 2023-05-31: qty 2

## 2023-05-31 MED ORDER — LIDOCAINE HCL (PF) 1 % IJ SOLN
INTRAMUSCULAR | Status: AC
  Filled 2023-05-31: qty 30

## 2023-05-31 MED ORDER — PROPOFOL 10 MG/ML IV BOLUS
INTRAVENOUS | Status: AC
  Filled 2023-05-31: qty 20

## 2023-05-31 MED ORDER — OXYCODONE HCL 5 MG PO TABS: 5.0000 mg | ORAL_TABLET | Freq: Once | ORAL | Status: DC | PRN

## 2023-05-31 MED ORDER — SODIUM CHLORIDE 0.9 % IV SOLN: INTRAVENOUS | Status: DC | PRN

## 2023-05-31 MED ORDER — MUPIROCIN 2 % EX OINT
1.0000 | TOPICAL_OINTMENT | Freq: Two times a day (BID) | CUTANEOUS | Status: DC
  Filled 2023-05-31: qty 22

## 2023-05-31 MED ORDER — LIDOCAINE HCL (PF) 2 % IJ SOLN
INTRAMUSCULAR | Status: AC
  Filled 2023-05-31: qty 5

## 2023-05-31 MED ORDER — FENTANYL CITRATE (PF) 100 MCG/2ML IJ SOLN: 25.0000 ug | INTRAMUSCULAR | Status: DC | PRN

## 2023-05-31 MED ORDER — PROPOFOL 500 MG/50ML IV EMUL
INTRAVENOUS | Status: DC | PRN
  Administered 2023-05-31: 150 ug/kg/min via INTRAVENOUS

## 2023-05-31 MED ORDER — FENTANYL CITRATE (PF) 100 MCG/2ML IJ SOLN
INTRAMUSCULAR | Status: AC
  Filled 2023-05-31: qty 2

## 2023-05-31 MED ORDER — ONDANSETRON HCL 4 MG/2ML IJ SOLN
INTRAMUSCULAR | Status: AC
  Filled 2023-05-31: qty 2

## 2023-05-31 MED ORDER — LIDOCAINE HCL 1 % IJ SOLN
INTRAMUSCULAR | Status: DC | PRN
  Administered 2023-05-31: 20 mL via INTRAMUSCULAR

## 2023-05-31 MED ORDER — MIDAZOLAM HCL 2 MG/2ML IJ SOLN
INTRAMUSCULAR | Status: DC | PRN
  Administered 2023-05-31: 2 mg via INTRAVENOUS

## 2023-05-31 MED ORDER — 0.9 % SODIUM CHLORIDE (POUR BTL) OPTIME
TOPICAL | Status: DC | PRN
  Administered 2023-05-31: 500 mL

## 2023-05-31 MED ORDER — GLYCOPYRROLATE 0.2 MG/ML IJ SOLN
INTRAMUSCULAR | Status: AC
  Filled 2023-05-31: qty 1

## 2023-05-31 MED ORDER — ONDANSETRON HCL 4 MG/2ML IJ SOLN
INTRAMUSCULAR | Status: DC | PRN
  Administered 2023-05-31: 4 mg via INTRAVENOUS

## 2023-05-31 MED ORDER — OXYCODONE HCL 5 MG/5ML PO SOLN: 5.0000 mg | Freq: Once | ORAL | Status: DC | PRN

## 2023-05-31 MED ORDER — DEXAMETHASONE SODIUM PHOSPHATE 10 MG/ML IJ SOLN
INTRAMUSCULAR | Status: AC
  Filled 2023-05-31: qty 1

## 2023-05-31 MED ORDER — BUPIVACAINE HCL (PF) 0.5 % IJ SOLN
INTRAMUSCULAR | Status: AC
  Filled 2023-05-31: qty 30

## 2023-05-31 MED ORDER — GLYCOPYRROLATE 0.2 MG/ML IJ SOLN
INTRAMUSCULAR | Status: DC | PRN
  Administered 2023-05-31: .2 mg via INTRAVENOUS

## 2023-05-31 MED ORDER — VANCOMYCIN HCL 1250 MG/250ML IV SOLN
1250.0000 mg | Freq: Once | INTRAVENOUS | Status: AC
Start: 2023-05-31 — End: 2023-05-31
  Administered 2023-05-31: 1250 mg via INTRAVENOUS
  Filled 2023-05-31: qty 250

## 2023-05-31 SURGICAL SUPPLY — 49 items
BLADE MED AGGRESSIVE (BLADE) ×1 IMPLANT
BLADE OSC/SAGITTAL MD 5.5X18 (BLADE) IMPLANT
BLADE SURG 15 STRL LF DISP TIS (BLADE) IMPLANT
BLADE SURG 15 STRL SS (BLADE)
BLADE SURG MINI STRL (BLADE) IMPLANT
BNDG CMPR 5X4 KNIT ELC UNQ LF (GAUZE/BANDAGES/DRESSINGS) ×1
BNDG CMPR 75X21 PLY HI ABS (MISCELLANEOUS)
BNDG ELASTIC 4INX 5YD STR LF (GAUZE/BANDAGES/DRESSINGS) ×1 IMPLANT
BNDG ESMARCH 4 X 12 STRL LF (GAUZE/BANDAGES/DRESSINGS) ×1
BNDG ESMARCH 4X12 STRL LF (GAUZE/BANDAGES/DRESSINGS) ×1 IMPLANT
BNDG GAUZE DERMACEA FLUFF 4 (GAUZE/BANDAGES/DRESSINGS) ×1 IMPLANT
BNDG GZE DERMACEA 4 6PLY (GAUZE/BANDAGES/DRESSINGS) ×3
CNTNR URN SCR LID CUP LEK RST (MISCELLANEOUS) ×1 IMPLANT
CONT SPEC 4OZ STRL OR WHT (MISCELLANEOUS) ×2
CUFF TOURN SGL QUICK 12 (TOURNIQUET CUFF) IMPLANT
CUFF TOURN SGL QUICK 18X4 (TOURNIQUET CUFF) IMPLANT
DRAPE FLUOR MINI C-ARM 54X84 (DRAPES) IMPLANT
DURAPREP 26ML APPLICATOR (WOUND CARE) ×1 IMPLANT
ELECT REM PT RETURN 9FT ADLT (ELECTROSURGICAL) ×1
ELECTRODE REM PT RTRN 9FT ADLT (ELECTROSURGICAL) ×1 IMPLANT
GAUZE SPONGE 4X4 12PLY STRL (GAUZE/BANDAGES/DRESSINGS) ×2 IMPLANT
GAUZE STRETCH 2X75IN STRL (MISCELLANEOUS) IMPLANT
GAUZE XEROFORM 1X8 LF (GAUZE/BANDAGES/DRESSINGS) ×1 IMPLANT
GLOVE BIO SURGEON STRL SZ7.5 (GLOVE) ×2 IMPLANT
GOWN STRL REUS W/ TWL LRG LVL3 (GOWN DISPOSABLE) ×2 IMPLANT
GOWN STRL REUS W/TWL LRG LVL3 (GOWN DISPOSABLE) ×2
HANDPIECE VERSAJET DEBRIDEMENT (MISCELLANEOUS) IMPLANT
IV NS IRRIG 3000ML ARTHROMATIC (IV SOLUTION) IMPLANT
KIT TURNOVER KIT A (KITS) ×1 IMPLANT
LABEL OR SOLS (LABEL) ×1 IMPLANT
MANIFOLD NEPTUNE II (INSTRUMENTS) ×1 IMPLANT
NDL FILTER BLUNT 18X1 1/2 (NEEDLE) ×1 IMPLANT
NDL HYPO 25X1 1.5 SAFETY (NEEDLE) ×2 IMPLANT
NEEDLE FILTER BLUNT 18X1 1/2 (NEEDLE) ×1 IMPLANT
NEEDLE HYPO 25X1 1.5 SAFETY (NEEDLE) ×2 IMPLANT
NS IRRIG 500ML POUR BTL (IV SOLUTION) ×1 IMPLANT
PACK EXTREMITY ARMC (MISCELLANEOUS) ×1 IMPLANT
SOL PREP PVP 2OZ (MISCELLANEOUS) ×1
SOLUTION PREP PVP 2OZ (MISCELLANEOUS) ×1 IMPLANT
STAPLER SKIN PROX 35W (STAPLE) IMPLANT
STOCKINETTE STRL 6IN 960660 (GAUZE/BANDAGES/DRESSINGS) ×1 IMPLANT
SUT ETHILON 3-0 FS-10 30 BLK (SUTURE) ×1
SUT VIC AB 3-0 SH 27 (SUTURE) ×1
SUT VIC AB 3-0 SH 27X BRD (SUTURE) ×1 IMPLANT
SUTURE EHLN 3-0 FS-10 30 BLK (SUTURE) ×2 IMPLANT
SWAB CULTURE AMIES ANAERIB BLU (MISCELLANEOUS) IMPLANT
SYR 10ML LL (SYRINGE) ×1 IMPLANT
TRAP FLUID SMOKE EVACUATOR (MISCELLANEOUS) ×1 IMPLANT
WATER STERILE IRR 500ML POUR (IV SOLUTION) ×1 IMPLANT

## 2023-05-31 NOTE — Interval H&P Note (Signed)
History and Physical Interval Note:  05/31/2023 8:25 AM  Allen Chavez  has presented today for surgery, with the diagnosis of osteomyelitis.  The various methods of treatment have been discussed with the patient and family. After consideration of risks, benefits and other options for treatment, the patient has consented to  Procedure(s): TRANSMETATARSAL AMPUTATION (Right) as a surgical intervention.  [name] history has been reviewed, patient examined, no change in status, stable for surgery.  I have reviewed the patient's chart and labs.  Questions were answered to the patient's satisfaction.     Louann Sjogren

## 2023-05-31 NOTE — Op Note (Addendum)
OPERATIVE REPORT Patient name: Allen Chavez MRN: [id number] DOB: 18-Jun-1956  DOS:  05/31/23  Preop Dx: Gangrene of right foot (HCC) - Plan: US ARTERIAL ABI (SCREENING LOWER EXTREMITY), US ARTERIAL ABI (SCREENING LOWER EXTREMITY)  Sepsis with acute renal failure and septic shock, due to unspecified organism, unspecified acute renal failure type (HCC)  Necrotizing subcutaneous infection (HCC) - Plan: US ARTERIAL ABI (SCREENING LOWER EXTREMITY), US ARTERIAL ABI (SCREENING LOWER EXTREMITY) Postop Dx: same  Procedure:  1. Right foot transmetatarsal amputation   Surgeon: Louann Sjogren, DPM  Anesthesia: 50-50 mixture of 2% lidocaine plain with 0.5% Marcaine plain totaling 20 infiltrated in the patient's right lower extremity  Hemostasis: No TQ necessary   EBL: 20 mL Materials: n/a Injectables: as above Pathology: Right foot for pathology, culture of bone of infected foot, culture of bone residual fourth metatarsal   Condition: The patient tolerated the procedure and anesthesia well. No complications noted or reported   Justification for procedure: The patient is a 67 y.o. male who presents today for surgical treatment of right foot infection and osteomyelitis. This is an emergent procedure. . All conservative modalities of been unsuccessful in providing any sort of satisfactory alleviation of symptoms with the patient. The patient was told benefits as well as possible side effects of the surgery. The patient consented for surgical correction verbally. Upon arrival in OR and after induction of anesthesia appeared patient had not signed written consent. .In PACU prior patient given Oral consent after discussion of risks and benefits of procedure. No guarantees were expressed or implied. The surgeon  signed the patient consent form with the witness present and placed in the patient's chart. Patient had also given oral consent to nurse.  Given the worsening necrotizing infection opted to proceed  with procedure despite obvious written consent.    Procedure in Detail: The patient was brought to the operating room, placed in the operating table in the supine position at which time an aseptic scrub and drape were performed about the patient's respective lower extremity after anesthesia was induced as described above. Attention was then directed to the surgical area where procedure number one commenced.  Procedure #1:   After suitable general anesthesia, the patient's right lower extremity was prepped and draped in the usual sterile fashion. Attention was directed to the operative extremity with visible site marking. Incision was planned with skin marker. Dorsal incision was then made with a 15 blade through skin and subcutaneous tissue down to bone. Electrocautery was used to ligate any bleeders. Incision was then made along the plantar aspect of the foot careful to ligate any bleeders with electrocautery. Next, the bony cuts were made using sagittal saw, and these were made approximately at the mid level of the metatarsals and in a cascade fashion, transecting all of the 5 metatarsal bones. Next, the amputated forefoot was then removed and sent for pathology with proximal margin marked.  Following this, the wound was thoroughly irrigated and then clean area of bone around the fourth metatrsal was biopsied for culture. Cutlure was also obtained from removed infected bone. Hemostasis was then obtained After full hemostasis had been obtained, the wound was again thoroughly irrigated and then the dorsal plantar flap was further tailored so that the plantar flap was lying in a dorsal direction. The skin  was closed with interrupted sutures using 3-0 Nylon in a vertical and horizontal mattress fashion and staples. .. A Xeroform and a bulky foot dressing were applied, and the patient awakened and transferred  to the recovery room in a stable condition.   Dry sterile compressive dressings were then applied to  all previously mentioned incision sites about the patient's lower extremity. The patient was then transferred from the operating room to the recovery room having tolerated the procedure and anesthesia well. All vital signs are stable. After a brief stay in the recovery room the patient was readmitted to the floor.    Disposition: Patient to be NWB and keep dressing clean dry and intact. Will follow cultures.    Louann Sjogren, DPM [facility]  Dr. Louann Sjogren, DPM    [address] [place], [place] [address]                Office [phone]  Fax [phone]

## 2023-05-31 NOTE — Anesthesia Procedure Notes (Signed)
Date/Time: 05/31/2023 8:41 AM  Performed by: Stormy Fabian, CRNAPre-anesthesia Checklist: Patient identified, Emergency Drugs available, Suction available and Patient being monitored Patient Re-evaluated:Patient Re-evaluated prior to induction Oxygen Delivery Method: Simple face mask Induction Type: IV induction Dental Injury: Teeth and Oropharynx as per pre-operative assessment

## 2023-05-31 NOTE — Progress Notes (Addendum)
Progress Note   Patient: Allen Chavez ZOX:[id number] DOB: 23-Dec-1955 DOA: 05/28/2023     3 DOS: the patient was seen and examined on 05/31/2023   Brief hospital course:  Allen Chavez is a 67 y.o. male with medical history significant of insulin-dependent diabetes mellitus, hypertension, PAD and CKD stage IIIa presented to ED with generalized malaise and worsening right foot wound with concern of gangrene and surrounding erythema with tracking up to upper legs for the past 2 days.  Patient also endorsed some subjective fever and chills at home. He was having generalized malaise and poor p.o. intake for the past couple of days.  On presentation  Patient was afebrile, tachycardic at 112 and softer blood pressure 94/52.  Labs pertinent for leukocytosis at 18.1, absolute neutrophil 15.8, hemoglobin 11.5, lactic acid 3.7, sodium 133, CO2 21, glucose 157, BUN 45, creatinine 3.24 with GFR of 20, baseline seems to be around 1.4-1.5.  CXR with no active cardiopulmonary disease. Right foot imaging with asymmetric soft tissue swelling around fifth toe with some probable small amount of air in the soft tissue.  Also noted to have a small cortical breach in the distal phalanx, concerning for acute osteomyelitis.  MRI was ordered but pending at the time of interview. EKG-personally reviewed, sinus tachycardia with HR of 115, no significant ST-T changes.   Patient met severe sepsis criteria and blood cultures were drawn.  Received IV fluid and started on broad-spectrum antibiotics.  Vascular surgery and podiatry was consulted.  10/31: Afebrile this morning, maximum temperature recorded of 100.5 overnight.  Lactic acidosis resolved, as some improvement in leukocytosis and hemoglobin decreased to 9.3, all cell line decreased so likely some dilutional effect.  Significantly elevated CRP at 27.3, ESR 81, A1c 7.1, TSH elevated at 12.732, checking thyroid panel.  Preliminary blood cultures negative.  MRI of right foot with  concern of osteo of fifth toe, no discrete abscess.  Going for angiography with vascular today, followed by likely transmetatarsal amputation on Friday or Saturday Hypoglycemia as patient was n.p.o., switching fluids to LR with D5  11/1: S/p angiography and stents placement in right lower extremity.  Transmetatarsal amputation scheduled for tomorrow.  Creatinine slowly improving, at 1.92 today, improving leukocytosis And stable hemoglobin.  Anemia panel consistent with anemia of chronic disease with some iron and B12 deficiency.  B12 at 177.  Ordered IM replacement and also starting on supplement.  11/2: Patient had his right transmetatarsal amputation earlier today, tolerated the procedure well. Thyroid panel with some improvement in TSH to 7, increased T3 uptake but normal T4 and T3. Will need a repeat in 3 to 4 weeks by PCP   Assessment and Plan: * Severe sepsis [facility]) Patient met severe sepsis criteria with leukocytosis, tachycardia and endorgan damage manifested as AKI and lactic acidosis at 3.7, which has been resolved now Likely secondary to right foot gangrenous wound. Received IV fluid per sepsis protocol and started on broad-spectrum antibiotics.  Blood cultures remain negative Podiatry and vascular surgery was consulted, s/p angiography and PCI to RLE Patient had his transmetatarsal amputation today -Follow-up intraoperative cultures -Continue broad-spectrum antibiotics with cefepime, vancomycin and Flagyl.  AKI (acute kidney injury) (HCC) History of CKD stage IIIa.  Baseline creatinine seems to be around 1.4-1.5 on care everywhere. Creatinine at 3.25 on admission which is slowly improving, currently at 1.90 -Continue with gentle IV fluid for another day -Monitor renal function  Peripheral arterial disease (HCC) S/p left BKA many years ago.  Had angiography with stents placement  on right lower extremity yesterday with vascular surgery Patient was not on any antiplatelet before  so he was started on aspirin and Plavix -Continue statin  Type 2 diabetes mellitus (HCC) Patient uses 60 units of Lantus with NovoLog at home. Recent A1c in care everywhere at 7.7 checked in September 2024 -Semglee 20 units twice daily -SSI  Essential (primary) hypertension Patient was on lisinopril at home.  Blood pressure soft on admission. -Holding lisinopril for AKI and sepsis -Monitor blood pressure  Hyperlipidemia -Continue home statin  GERD (gastroesophageal reflux disease) -Continue PPI  Malignant neoplasm of prostate [facility]) Patient has an history of prostate malignancy. Recent PSA checked last month was undetectable. -Outpatient follow-up  Normocytic anemia Hemoglobin currently stable at 9.3, baseline around 12-13. No obvious bleeding.  Likely some dilutional effect Anemia panel consistent with anemia of chronic disease with some iron and B12 deficiency.  B12 at 177 with a goal above 400 -Ordered parenteral B12 supplement for 3 days -Started on p.o. supplement -Monitor hemoglobin  Elevated TSH Elevated TSH above 12, no prior diagnosis of hypothyroidism.  On repeat TSH was 7, normal total T4 and free thyroxine index, slightly increased T3 uptake ratio. -Patient will need repeat levels by PCP in 3 to 4 weeks    Subjective: Patient was seen after amputation.  Pain seems well-controlled at this time.  No new concern  Physical Exam: Vitals:   05/31/23 1000 05/31/23 1015 05/31/23 1030 05/31/23 1104  BP: (!) 107/57 105/60 (!) 106/59 112/66  Pulse: 81 80 76 84  Resp: 15 16 17 14   Temp: 97.9 F (36.6 C)   97.8 F (36.6 C)  TempSrc:    Oral  SpO2: 95% 95% 96% 94%  Weight:      Height:       General.  Frail gentleman, in no acute distress. Pulmonary.  Lungs clear bilaterally, normal respiratory effort. CV.  Regular rate and rhythm, no JVD, rub or murmur. Abdomen.  Soft, nontender, nondistended, BS positive. CNS.  Alert and oriented .  No focal neurologic  deficit. Extremities.  Right foot with bandage and Ace wrap, left BKA Psychiatry.  Judgment and insight appears normal. .    Data Reviewed: Prior data reviewed  Family Communication: Discussed with patient.  He does not want [place] to communicate with his family stating that he is being talking with them himself.  Daughters are here but they were not present at the time of rounding today.  Disposition: Status is: Inpatient Remains inpatient appropriate because: Prior data reviewed  Planned Discharge Destination:  To be determined  DVT prophylaxis.  Subcu heparin Time spent: 45 minutes  This record has been created using Conservation officer, historic buildings. Errors have been sought and corrected,but may not always be located. Such creation errors do not reflect on the standard of care.   Author: Arnetha Courser, MD 05/31/2023 1:46 PM  For on call review [email or web address].

## 2023-05-31 NOTE — Assessment & Plan Note (Signed)
Patient was on lisinopril at home.  Blood pressure soft on admission. -Holding lisinopril for AKI and sepsis -Monitor blood pressure

## 2023-05-31 NOTE — Progress Notes (Signed)
       CROSS COVER NOTE  NAME: Akili Corsetti MRN: [id number] DOB : 10/23/1955    Concern as stated by nurse / staff   Hello patient here for osteomyelitis had unwitnessed fall, trying to get out of bed, found on floor no complaints vitals BP 141/61 HR 125. Skin tear on right stump. Please advise      Pertinent findings on chart review:   Assessment and  Interventions   Assessment:    05/31/2023   10:04 PM 05/31/2023    3:27 PM 05/31/2023   11:04 AM  Vitals with BMI  Systolic 141 128 409  Diastolic 61 70 66  Pulse 122 95 84   Bedside patient explains he was trying to get to bedside toilet instead of using urinal to pee and his foot slid on the floor.   He denies hitting his head, he denies pain.  Plan: Continue high risk fall precautions       Donnie Mesa NP [facility] Cross Cover 7pm-7am - check amion for availability Pager [phone]

## 2023-05-31 NOTE — Transfer of Care (Signed)
Immediate Anesthesia Transfer of Care Note  Patient: Allen Chavez  Procedure(s) Performed: Procedure(s): TRANSMETATARSAL AMPUTATION (Right)  Patient Location: PACU  Anesthesia Type:General  Level of Consciousness: sedated  Airway & Oxygen Therapy: Patient Spontanous Breathing and Patient connected to face mask oxygen  Post-op Assessment: Report given to RN and Post -op Vital signs reviewed and stable  Post vital signs: Reviewed and stable  Last Vitals:  Vitals:   05/31/23 0927 05/31/23 0928  BP: (!) 99/53 (!) 99/53  Pulse: 72 72  Resp: 14 14  Temp: 37 C 37 C  SpO2: 98% 99%    Complications: No apparent anesthesia complications

## 2023-05-31 NOTE — Anesthesia Postprocedure Evaluation (Signed)
Anesthesia Post Note  Patient: Allen Chavez  Procedure(s) Performed: TRANSMETATARSAL AMPUTATION (Right: Foot)  Patient location during evaluation: PACU Anesthesia Type: General Level of consciousness: awake and alert Pain management: pain level controlled Vital Signs Assessment: post-procedure vital signs reviewed and stable Respiratory status: spontaneous breathing, nonlabored ventilation, respiratory function stable and patient connected to nasal cannula oxygen Cardiovascular status: blood pressure returned to baseline and stable Postop Assessment: no apparent nausea or vomiting Anesthetic complications: no   No notable events documented.   Last Vitals:  Vitals:   05/31/23 0928 05/31/23 0930  BP: (!) 99/53 (!) 90/49  Pulse: 72 72  Resp: 14 14  Temp: 37 C   SpO2: 99% 97%    Last Pain:  Vitals:   05/31/23 0930  TempSrc:   PainSc: Asleep                 Louie Boston

## 2023-05-31 NOTE — Progress Notes (Addendum)
Keep patient NPO after midnight because I saw he had surgery scheduled this morning. CHG bath completed and new gown given to patient.

## 2023-05-31 NOTE — Assessment & Plan Note (Signed)
Elevated TSH above 12, no prior diagnosis of hypothyroidism.  On repeat TSH was 7, normal total T4 and free thyroxine index, slightly increased T3 uptake ratio. -Patient will need repeat levels by PCP in 3 to 4 weeks

## 2023-05-31 NOTE — Brief Op Note (Addendum)
05/28/2023 - 05/31/2023  8:39 AM  PATIENT:  Allen Chavez  67 y.o. male  PRE-OPERATIVE DIAGNOSIS:  osteomyelitis  POST-OPERATIVE DIAGNOSIS:  * No post-op diagnosis entered *  PROCEDURE:  Procedure(s): TRANSMETATARSAL AMPUTATION (Right)  SURGEON:  Surgeons and Role:    Louann Sjogren, DPM - Primary  PHYSICIAN ASSISTANT:   ASSISTANTS: none   ANESTHESIA:   local and MAC  EBL:  20 cc   BLOOD ADMINISTERED:none  DRAINS: none   LOCAL MEDICATIONS USED:  MARCAINE   , LIDOCAINE , and Amount: 20 ml  SPECIMEN:  Source of Specimen:  Right foot for pathology, culture of bone right foot infection, culture of residual bone right fourth metatarsal  DISPOSITION OF SPECIMEN:   pathology and culture   COUNTS:  YES  TOURNIQUET:  * Missing tourniquet times found for documented tourniquets in log: [id number] *  DICTATION: .Note written in EPIC  PLAN OF CARE: Admit to inpatient   PATIENT DISPOSITION:  PACU - hemodynamically stable.   Delay start of Pharmacological VTE agent (>24hrs) due to surgical blood loss or risk of bleeding: no

## 2023-05-31 NOTE — Plan of Care (Signed)
  Problem: Education: Goal: Ability to describe self-care measures that may prevent or decrease complications (Diabetes Survival Skills Education) will improve Outcome: Progressing Goal: Individualized Educational Video(s) Outcome: Progressing   Problem: Coping: Goal: Ability to adjust to condition or change in health will improve Outcome: Progressing   Problem: Fluid Volume: Goal: Ability to maintain a balanced intake and output will improve Outcome: Progressing   Problem: Health Behavior/Discharge Planning: Goal: Ability to identify and utilize available resources and services will improve Outcome: Progressing Goal: Ability to manage health-related needs will improve Outcome: Progressing   Problem: Metabolic: Goal: Ability to maintain appropriate glucose levels will improve Outcome: Progressing   Problem: Nutritional: Goal: Maintenance of adequate nutrition will improve Outcome: Progressing Goal: Progress toward achieving an optimal weight will improve Outcome: Progressing   Problem: Skin Integrity: Goal: Risk for impaired skin integrity will decrease Outcome: Progressing   Problem: Tissue Perfusion: Goal: Adequacy of tissue perfusion will improve Outcome: Progressing   Problem: Education: Goal: Knowledge of General Education information will improve Description: Including pain rating scale, medication(s)/side effects and non-pharmacologic comfort measures Outcome: Progressing   Problem: Health Behavior/Discharge Planning: Goal: Ability to manage health-related needs will improve Outcome: Progressing   Problem: Clinical Measurements: Goal: Ability to maintain clinical measurements within normal limits will improve Outcome: Progressing Goal: Will remain free from infection Outcome: Progressing Goal: Diagnostic test results will improve Outcome: Progressing Goal: Respiratory complications will improve Outcome: Progressing Goal: Cardiovascular complication will  be avoided Outcome: Progressing   Problem: Activity: Goal: Risk for activity intolerance will decrease Outcome: Progressing   Problem: Nutrition: Goal: Adequate nutrition will be maintained Outcome: Progressing   Problem: Coping: Goal: Level of anxiety will decrease Outcome: Progressing   Problem: Elimination: Goal: Will not experience complications related to bowel motility Outcome: Progressing Goal: Will not experience complications related to urinary retention Outcome: Progressing   Problem: Pain Management: Goal: General experience of comfort will improve Outcome: Progressing   Problem: Safety: Goal: Ability to remain free from injury will improve Outcome: Progressing   Problem: Skin Integrity: Goal: Risk for impaired skin integrity will decrease Outcome: Progressing   Problem: Education: Goal: Knowledge of General Education information will improve Description: Including pain rating scale, medication(s)/side effects and non-pharmacologic comfort measures Outcome: Progressing   Problem: Health Behavior/Discharge Planning: Goal: Ability to manage health-related needs will improve Outcome: Progressing   Problem: Clinical Measurements: Goal: Ability to maintain clinical measurements within normal limits will improve Outcome: Progressing Goal: Will remain free from infection Outcome: Progressing Goal: Diagnostic test results will improve Outcome: Progressing Goal: Respiratory complications will improve Outcome: Progressing Goal: Cardiovascular complication will be avoided Outcome: Progressing   Problem: Activity: Goal: Risk for activity intolerance will decrease Outcome: Progressing   Problem: Nutrition: Goal: Adequate nutrition will be maintained Outcome: Progressing   Problem: Coping: Goal: Level of anxiety will decrease Outcome: Progressing   Problem: Elimination: Goal: Will not experience complications related to bowel motility Outcome:  Progressing Goal: Will not experience complications related to urinary retention Outcome: Progressing   Problem: Pain Management: Goal: General experience of comfort will improve Outcome: Progressing   Problem: Safety: Goal: Ability to remain free from injury will improve Outcome: Progressing   Problem: Skin Integrity: Goal: Risk for impaired skin integrity will decrease Outcome: Progressing
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
## 2023-05-31 NOTE — Plan of Care (Signed)
  Problem: Coping: Goal: Ability to adjust to condition or change in health will improve Outcome: Progressing   Problem: Fluid Volume: Goal: Ability to maintain a balanced intake and output will improve Outcome: Progressing   Problem: Education: Goal: Knowledge of General Education information will improve Description: Including pain rating scale, medication(s)/side effects and non-pharmacologic comfort measures Outcome: Progressing   Problem: Health Behavior/Discharge Planning: Goal: Ability to manage health-related needs will improve Outcome: Progressing

## 2023-06-01 DIAGNOSIS — I739 Peripheral vascular disease, unspecified: Secondary | ICD-10-CM | POA: Diagnosis not present

## 2023-06-01 DIAGNOSIS — A419 Sepsis, unspecified organism: Secondary | ICD-10-CM | POA: Diagnosis not present

## 2023-06-01 DIAGNOSIS — E1152 Type 2 diabetes mellitus with diabetic peripheral angiopathy with gangrene: Secondary | ICD-10-CM | POA: Diagnosis not present

## 2023-06-01 DIAGNOSIS — N179 Acute kidney failure, unspecified: Secondary | ICD-10-CM | POA: Diagnosis not present

## 2023-06-01 LAB — CBC
HCT: 24.5 % — ABNORMAL LOW (ref 39.0–52.0)
Hemoglobin: 8.5 g/dL — ABNORMAL LOW (ref 13.0–17.0)
MCH: 28.1 pg (ref 26.0–34.0)
MCHC: 34.7 g/dL (ref 30.0–36.0)
MCV: 80.9 fL (ref 80.0–100.0)
Platelets: 303 10*3/uL (ref 150–400)
RBC: 3.03 MIL/uL — ABNORMAL LOW (ref 4.22–5.81)
RDW: 15.7 % — ABNORMAL HIGH (ref 11.5–15.5)
WBC: 16.6 10*3/uL — ABNORMAL HIGH (ref 4.0–10.5)
nRBC: 0 % (ref 0.0–0.2)

## 2023-06-01 LAB — BASIC METABOLIC PANEL
Anion gap: 7 (ref 5–15)
BUN: 39 mg/dL — ABNORMAL HIGH (ref 8–23)
CO2: 21 mmol/L — ABNORMAL LOW (ref 22–32)
Calcium: 8.1 mg/dL — ABNORMAL LOW (ref 8.9–10.3)
Chloride: 111 mmol/L (ref 98–111)
Creatinine, Ser: 1.67 mg/dL — ABNORMAL HIGH (ref 0.61–1.24)
GFR, Estimated: 44 mL/min — ABNORMAL LOW (ref >60)
Glucose, Bld: 148 mg/dL — ABNORMAL HIGH (ref 70–99)
Potassium: 3.9 mmol/L (ref 3.5–5.1)
Sodium: 139 mmol/L (ref 135–145)

## 2023-06-01 LAB — GLUCOSE, CAPILLARY
Glucose-Capillary: 145 mg/dL — ABNORMAL HIGH (ref 70–99)
Glucose-Capillary: 329 mg/dL — ABNORMAL HIGH (ref 70–99)
Glucose-Capillary: 317 mg/dL — ABNORMAL HIGH (ref 70–99)
Glucose-Capillary: 251 mg/dL — ABNORMAL HIGH (ref 70–99)

## 2023-06-01 MED ORDER — MELATONIN 5 MG PO TABS
5.0000 mg | ORAL_TABLET | Freq: Every day | ORAL | Status: DC
  Administered 2023-06-01 – 2023-06-10 (×10): 5 mg via ORAL
  Filled 2023-06-01 (×10): qty 1

## 2023-06-01 MED ORDER — INSULIN ASPART 100 UNIT/ML IJ SOLN
0.0000 [IU] | Freq: Three times a day (TID) | INTRAMUSCULAR | Status: DC
  Administered 2023-06-01 (×2): 11 [IU] via SUBCUTANEOUS
  Administered 2023-06-02 (×3): 3 [IU] via SUBCUTANEOUS
  Administered 2023-06-03: 2 [IU] via SUBCUTANEOUS
  Administered 2023-06-03: 5 [IU] via SUBCUTANEOUS
  Administered 2023-06-03 – 2023-06-05 (×5): 3 [IU] via SUBCUTANEOUS
  Administered 2023-06-05: 5 [IU] via SUBCUTANEOUS
  Administered 2023-06-06: 2 [IU] via SUBCUTANEOUS
  Administered 2023-06-07 – 2023-06-08 (×3): 3 [IU] via SUBCUTANEOUS
  Administered 2023-06-08: 5 [IU] via SUBCUTANEOUS
  Administered 2023-06-08: 3 [IU] via SUBCUTANEOUS
  Administered 2023-06-09: 5 [IU] via SUBCUTANEOUS
  Administered 2023-06-09 – 2023-06-10 (×3): 3 [IU] via SUBCUTANEOUS
  Administered 2023-06-10: 8 [IU] via SUBCUTANEOUS
  Administered 2023-06-10: 5 [IU] via SUBCUTANEOUS
  Administered 2023-06-11: 3 [IU] via SUBCUTANEOUS
  Filled 2023-06-01 (×26): qty 1

## 2023-06-01 MED ORDER — VANCOMYCIN HCL 1250 MG/250ML IV SOLN
1250.0000 mg | INTRAVENOUS | Status: DC
  Administered 2023-06-01 – 2023-06-02 (×2): 1250 mg via INTRAVENOUS
  Filled 2023-06-01 (×2): qty 250

## 2023-06-01 MED ORDER — INSULIN ASPART 100 UNIT/ML IJ SOLN
0.0000 [IU] | Freq: Every day | INTRAMUSCULAR | Status: DC
  Administered 2023-06-01: 3 [IU] via SUBCUTANEOUS
  Administered 2023-06-04: 2 [IU] via SUBCUTANEOUS
  Filled 2023-06-01 (×3): qty 1

## 2023-06-01 MED ORDER — POLYETHYLENE GLYCOL 3350 17 G PO PACK
17.0000 g | PACK | Freq: Two times a day (BID) | ORAL | Status: DC
  Administered 2023-06-01 – 2023-06-10 (×7): 17 g via ORAL
  Filled 2023-06-01 (×17): qty 1

## 2023-06-01 NOTE — Progress Notes (Addendum)
At 2230 patient called out, while pulling patient's medications another RN notified me patient needs to use restroom. Went into room and patient was on the floor. Unwitnessed. Patient said I don't know I wanted to get up to use the BR, despite urinal being at bedside. Patient educated prior on using call light and urinal. Bed alarm was on. Floor mats in place. Patient refused to contact family member. Slight skin tear on left stump. Dressing applied. No other complaints or reported symptoms.Patient L BKA. Post fall vitals obtained. Charge nurse aaware and NP on call notified via face to face. No new orders. Continue to monitor and educate

## 2023-06-01 NOTE — Consult Note (Signed)
Pharmacy Antibiotic Note  Allen Chavez is a 67 y.o. male admitted on 05/28/2023 with sepsis secondary to right foot wound/gangrene. PMH significant for T2DM, HTN, PAD s/p left BKA, and CKD III. Patient underwent right lower extremity angiography on 10/31 and received two stents. Podiatry is planning for transmetatarsal amputation on 11/02. Patient is febrile this morning (Tmax 101.3) with WBC 11.9. Pharmacy has been consulted for vancomycin and cefepime dosing.  Patient with severe AKI on admission (BL ~1.4-1.5 per care everywhere), which is improving. Will transition to AUC dosing.  Plan: Vancomycin 1250 mg IV Q 24 hrs. Goal AUC 400-550. Expected AUC: 475.5 SCr used: 1.67, Vd used: 0.72, TBW<IBW Give cefepime 2 grams IV every 12 hours based on current renal function Continue metronidazole per MD Monitor renal function, clinical status, culture data, and LOT  Height: 6\' 2"  (188 cm) Weight: 81.4 kg (179 lb 7.3 oz) IBW/kg (Calculated) : 82.2  Temp (24hrs), Avg:98.1 F (36.7 C), Min:97 F (36.1 C), Max:98.6 F (37 C)  Recent Labs  Lab 05/28/23 1535 05/28/23 1540 05/28/23 1925 05/29/23 0558 05/30/23 0321 05/30/23 0725 05/31/23 1214 06/01/23 0354  WBC 18.1*  --   --  14.2* 11.9*  --  16.2* 16.6*  CREATININE 3.24*  --   --  2.25* 1.92*  --  1.90* 1.67*  LATICACIDVEN  --  3.7* 1.6  --   --   --   --   --   VANCOTROUGH  --   --   --   --   --  7*  --   --     Estimated Creatinine Clearance: 48.7 mL/min (A) (by C-G formula based on SCr of 1.67 mg/dL (H)).    No Known Allergies  Antimicrobials this admission: vancomycin 10/30 >>  cefepime 10/30 >>  Flagyl 10/30 >>  Dose adjustments this admission: 11/01 Cefepime adjusted to 2g every 12 hours 11/03 Vancomycin changed from intermittent dosing to scheduled  Microbiology results: 10/30 BCx: NGTD  Thank you for involving pharmacy in this patient's care.   Rockwell Alexandria, PharmD Clinical Pharmacist 06/01/2023 9:16 AM

## 2023-06-01 NOTE — Progress Notes (Signed)
Progress Note   Patient: Allen Chavez ZOX:[id number] DOB: 17-Nov-1955 DOA: 05/28/2023     4 DOS: the patient was seen and examined on 06/01/2023   Brief hospital course:  Allen Chavez is a 67 y.o. male with medical history significant of insulin-dependent diabetes mellitus, hypertension, PAD and CKD stage IIIa presented to ED with generalized malaise and worsening right foot wound with concern of gangrene and surrounding erythema with tracking up to upper legs for the past 2 days.  Patient also endorsed some subjective fever and chills at home. He was having generalized malaise and poor p.o. intake for the past couple of days.  On presentation  Patient was afebrile, tachycardic at 112 and softer blood pressure 94/52.  Labs pertinent for leukocytosis at 18.1, absolute neutrophil 15.8, hemoglobin 11.5, lactic acid 3.7, sodium 133, CO2 21, glucose 157, BUN 45, creatinine 3.24 with GFR of 20, baseline seems to be around 1.4-1.5.  CXR with no active cardiopulmonary disease. Right foot imaging with asymmetric soft tissue swelling around fifth toe with some probable small amount of air in the soft tissue.  Also noted to have a small cortical breach in the distal phalanx, concerning for acute osteomyelitis.  MRI was ordered but pending at the time of interview. EKG-personally reviewed, sinus tachycardia with HR of 115, no significant ST-T changes.   Patient met severe sepsis criteria and blood cultures were drawn.  Received IV fluid and started on broad-spectrum antibiotics.  Vascular surgery and podiatry was consulted.  10/31: Afebrile this morning, maximum temperature recorded of 100.5 overnight.  Lactic acidosis resolved, as some improvement in leukocytosis and hemoglobin decreased to 9.3, all cell line decreased so likely some dilutional effect.  Significantly elevated CRP at 27.3, ESR 81, A1c 7.1, TSH elevated at 12.732, checking thyroid panel.  Preliminary blood cultures negative.  MRI of right foot with  concern of osteo of fifth toe, no discrete abscess.  Going for angiography with vascular today, followed by likely transmetatarsal amputation on Friday or Saturday Hypoglycemia as patient was n.p.o., switching fluids to LR with D5  11/1: S/p angiography and stents placement in right lower extremity.  Transmetatarsal amputation scheduled for tomorrow.  Creatinine slowly improving, at 1.92 today, improving leukocytosis And stable hemoglobin.  Anemia panel consistent with anemia of chronic disease with some iron and B12 deficiency.  B12 at 177.  Ordered IM replacement and also starting on supplement.  11/2: Patient had his right transmetatarsal amputation earlier today, tolerated the procedure well. Thyroid panel with some improvement in TSH to 7, increased T3 uptake but normal T4 and T3. Will need a repeat in 3 to 4 weeks by PCP.  11/3: Vital stable, creatinine continue to slowly improve, at 1.67 today.  Persistent leukocytosis likely some reactive aliment as patient had his TMA yesterday.  Preliminary wound culture with no organism but patient was already on antibiotics.  Pending PT/OT evaluation   Assessment and Plan: * Severe sepsis [facility]) Patient met severe sepsis criteria with leukocytosis, tachycardia and endorgan damage manifested as AKI and lactic acidosis at 3.7, which has been resolved now Likely secondary to right foot gangrenous wound. Received IV fluid per sepsis protocol and started on broad-spectrum antibiotics.  Blood cultures remain negative Podiatry and vascular surgery was consulted, s/p angiography and PCI to RLE Patient had his transmetatarsal amputation on 05/31/2023 -Follow-up intraoperative cultures-currently not growing any organism -Continue broad-spectrum antibiotics with cefepime, vancomycin and Flagyl. -PT/OT evaluation-patient will be nonweightbearing -Patient is high risk for BKA per podiatry  AKI (  acute kidney injury) (HCC) History of CKD stage IIIa.  Baseline  creatinine seems to be around 1.4-1.5 on care everywhere. Creatinine at 3.25 on admission which is slowly improving, currently at 1.67 -Encourage p.o. hydration -Monitor renal function  Peripheral arterial disease (HCC) S/p left BKA many years ago.  Had angiography with stents placement on right lower extremity yesterday with vascular surgery Patient was not on any antiplatelet before so he was started on aspirin and Plavix -Continue statin  Type 2 diabetes mellitus (HCC) Patient uses 60 units of Lantus with NovoLog at home. Recent A1c in care everywhere at 7.7 checked in September 2024 -Semglee 20 units twice daily -SSI  Essential (primary) hypertension Patient was on lisinopril at home.  Blood pressure soft on admission. -Holding lisinopril for AKI and sepsis -Monitor blood pressure  Hyperlipidemia -Continue home statin  GERD (gastroesophageal reflux disease) -Continue PPI  Malignant neoplasm of prostate [facility]) Patient has an history of prostate malignancy. Recent PSA checked last month was undetectable. -Outpatient follow-up  Normocytic anemia Hemoglobin currently stable at 9.3, baseline around 12-13. No obvious bleeding.  Likely some dilutional effect Anemia panel consistent with anemia of chronic disease with some iron and B12 deficiency.  B12 at 177 with a goal above 400 -Ordered parenteral B12 supplement for 3 days -Started on p.o. supplement -Monitor hemoglobin  Elevated TSH Elevated TSH above 12, no prior diagnosis of hypothyroidism.  On repeat TSH was 7, normal total T4 and free thyroxine index, slightly increased T3 uptake ratio. -Patient will need repeat levels by PCP in 3 to 4 weeks    Subjective: Patient was seen and examined today.  Denies any pain.  Daughter at bedside.  Physical Exam: Vitals:   05/31/23 1527 05/31/23 2204 06/01/23 0403 06/01/23 0826  BP: 128/70 (!) 141/61 127/67 136/69  Pulse: 95 (!) 122 89 89  Resp: 14 17 17 14   Temp: 98.6 F (37  C) (!) 97 F (36.1 C) 98.4 F (36.9 C) 97.7 F (36.5 C)  TempSrc: Oral   Oral  SpO2: 91% 98% 91% 94%  Weight:      Height:       General.  Frail gentleman, in no acute distress. Pulmonary.  Lungs clear bilaterally, normal respiratory effort. CV.  Regular rate and rhythm, no JVD, rub or murmur. Abdomen.  Soft, nontender, nondistended, BS positive. CNS.  Alert and oriented .  No focal neurologic deficit. Extremities.  RLE with TMA and bandage, left BKA.  Much improved erythema on right Psychiatry.  Judgment and insight appears normal. .    Data Reviewed: Prior data reviewed  Family Communication: Discussed with daughter at bedside  Disposition: Status is: Inpatient Remains inpatient appropriate because: Prior data reviewed  Planned Discharge Destination:  To be determined  DVT prophylaxis.  Subcu heparin Time spent: 44 minutes  This record has been created using Conservation officer, historic buildings. Errors have been sought and corrected,but may not always be located. Such creation errors do not reflect on the standard of care.   Author: Arnetha Courser, MD 06/01/2023 2:17 PM  For on call review [email or web address].

## 2023-06-01 NOTE — Assessment & Plan Note (Signed)
Patient uses 60 units of Lantus with NovoLog at home. Recent A1c in care everywhere at 7.7 checked in September 2024 -Semglee 20 units twice daily -SSI

## 2023-06-01 NOTE — Evaluation (Signed)
Physical Therapy Evaluation Patient Details Name: Allen Chavez MRN: [id number] DOB: 07/13/1956 Today's Date: 06/01/2023  History of Present Illness  Allen Chavez is a 67 y.o. male with medical history significant of insulin-dependent diabetes mellitus, hypertension, PAD and CKD stage IIIa presented to ED with generalized malaise and worsening right foot wound with concern of gangrene and surrounding erythema with tracking up to upper legs for the past 2 days.  Patient also endorsed some subjective fever and chills at home.  He was having generalized malaise and poor p.o. intake for the past couple of days. Pt is now S/P right transmetatarsal amputation earliercedure well.NWB to RLE.  Clinical Impression  Pt received in bed a little agitated but agreeable to PT Interventions. Pt is A and O x 4. Pt lives alone in a trailer home which he doesn't plan to return. Pt PLOF is Ind with all functions. Pt wants to return to [place] near her daughters after becomes Ind with functions. Pt has a L prosthesis. Pt demonstrates weakness in BLE, reduced skin integrity on L stump, NWB to RLE resulting in unable to donn the prosthesis and therefore Standing, transfers and gait not assessed today. Pt sat up on the EOB with sup. PT will continue ot complete assessment as appropriate. Meanwhile pt educated to transfer OOB to Standard W/C  using lateral scoot technique. Pt will be safe to donn the stump after the Stump heals. Pt sat on EB and completed AROM to  BLE, and continued AROM in supine. Pt provided with written instructions to perform exs in room every 3 hours 10 reps. Pt demonstrated good understanding. PT will continue in acute and pt will benefit from continued PT beyond Acute care.       If plan is discharge home, recommend the following: Two people to help with walking and/or transfers;Two people to help with bathing/dressing/bathroom;Assistance with cooking/housework;Direct supervision/assist for medications  management;Direct supervision/assist for financial management;Help with stairs or ramp for entrance   Can travel by private vehicle   No    Equipment Recommendations None recommended by PT  Recommendations for Other Services       Functional Status Assessment Patient has had a recent decline in their functional status and demonstrates the ability to make significant improvements in function in a reasonable and predictable amount of time.     Precautions / Restrictions Precautions Precautions: Fall Restrictions Weight Bearing Restrictions: Yes RLE Weight Bearing: Non weight bearing      Mobility  Bed Mobility Overal bed mobility: Modified Independent                  Transfers Overall transfer level: Needs assistance                 General transfer comment: unable to attmept 2/2 to PT NWB to RLE and unable to dsonn the prosthesis today    Ambulation/Gait               General Gait Details: deferred today  Stairs            Wheelchair Mobility     Tilt Bed    Modified Rankin (Stroke Patients Only)       Balance Overall balance assessment:  (sitting balance good. Unabel to assess Standing 2/2 to unable to stand.)  Pertinent Vitals/Pain Pain Assessment Pain Assessment: No/denies pain    Home Living Family/patient expects to be discharged to:: Private residence Living Arrangements: Alone Available Help at Discharge: Available PRN/intermittently;Friend(s) Type of Home: Mobile home Home Access: Stairs to enter       Home Layout: One level Home Equipment: None      Prior Function Prior Level of Function : Independent/Modified Independent             Mobility Comments: Independent ambulator. ADLs Comments: Ind     Extremity/Trunk Assessment   Upper Extremity Assessment Upper Extremity Assessment: Overall WFL for tasks assessed    Lower Extremity  Assessment Lower Extremity Assessment: Generalized weakness;RLE deficits/detail RLE Deficits / Details: NWB to RLE and weak but unable to perform MMT 2/2 new wamputation. RLE: Unable to fully assess due to pain RLE Sensation: WNL RLE Coordination: WNL       Communication   Communication Communication: No apparent difficulties  Cognition Arousal: Alert Behavior During Therapy: Agitated Overall Cognitive Status: Within Functional Limits for tasks assessed                                 General Comments: pt is planning ot move back to [place] after his current condition resolves.        General Comments General comments (skin integrity, edema, etc.): Decreased L stump skin integrity due ot skin tear which was convered with dreressing.    Exercises General Exercises - Lower Extremity Quad Sets: Strengthening, Both, 10 reps Long Arc Quad: Right, 10 reps, Seated Straight Leg Raises: AROM, Both, 10 reps, Supine Hip Flexion/Marching: AROM, Both, 10 reps, Seated, Other (comment) Other Exercises Other Exercises: same fro IN room exs every 3 hours. Provided on [employer].   Assessment/Plan    PT Assessment Patient needs continued PT services  PT Problem List Decreased strength;Decreased range of motion;Decreased activity tolerance;Decreased balance;Decreased mobility;Decreased safety awareness;Decreased knowledge of use of DME;Pain       PT Treatment Interventions Gait training;Stair training;Functional mobility training;Therapeutic activities;Therapeutic exercise;Balance training;Neuromuscular re-education;Patient/family education    PT Goals (Current goals can be found in the Care Plan section)  Acute Rehab PT Goals Patient Stated Goal: " I want to get better first and then return to [place] with my daughters." PT Goal Formulation: With patient Potential to Achieve Goals: Good    Frequency Min 1X/week     Co-evaluation               AM-PAC PT "6  Clicks" Mobility  Outcome Measure Help needed turning from your back to your side while in a flat bed without using bedrails?: None Help needed moving from lying on your back to sitting on the side of a flat bed without using bedrails?: None Help needed moving to and from a bed to a chair (including a wheelchair)?: Total Help needed standing up from a chair using your arms (e.g., wheelchair or bedside chair)?: Total Help needed to walk in hospital room?: Total Help needed climbing 3-5 steps with a railing? : Total 6 Click Score: 12    End of Session Equipment Utilized During Treatment: Gait belt Activity Tolerance: Patient tolerated treatment well Patient left: in bed;with call bell/phone within reach;with bed alarm set Nurse Communication: Mobility status PT Visit Diagnosis: Other abnormalities of gait and mobility (R26.89);Muscle weakness (generalized) (M62.81);Difficulty in walking, not elsewhere classified (R26.2);Pain Pain - Right/Left: Right Pain - part of body: Ankle and  joints of foot    Time: 1422-1445 PT Time Calculation (min) (ACUTE ONLY): 23 min   Charges:   PT Evaluation $PT Eval Low Complexity: 1 Low PT Treatments $Therapeutic Exercise: 8-22 mins PT General Charges $$ ACUTE PT VISIT: 1 Visit        Janet Berlin PT DPT 4:04 PM,06/01/23

## 2023-06-01 NOTE — Evaluation (Signed)
Occupational Therapy Evaluation Patient Details Name: Allen Chavez MRN: [id number] DOB: 06-16-1956 Today's Date: 06/01/2023   History of Present Illness Allen Chavez is a 67 y.o. male with medical history significant of insulin-dependent diabetes mellitus, hypertension, PAD and CKD stage IIIa presented to ED with generalized malaise and worsening right foot wound with concern of gangrene and surrounding erythema with tracking up to upper legs for the past 2 days.  Patient also endorsed some subjective fever and chills at home.  He was having generalized malaise and poor p.o. intake for the past couple of days. Pt is now S/P right transmetatarsal amputation and currently NWB to RLE.   Clinical Impression   Pt seen for OT eval this date, daughters present beginning of session. Chart review indicates a fall with pt attempting to get OOB without staff over the weekend and falling, resulting in skin tear on L residual limb so he is unable to don L prosthesis from prior BKA. PTA, pt lives in a mobile home and was MOD I for ADL/IADLs and mobility performance. D[place]  Pt denies ADL performance this date. Agreeable to performing bed mobility with cues + supervision. Anticipate pt will need education on lateral scoot transfers due to NWB on RLE/inability to don prosthesis on LLE, and further education/training on LB dressing using lateral leans. Likely MAX A for LB and transfers this date - pt has good bilat UE strength 5/5 and was able to scoot laterally using BUE. Good dynamic sitting balance without extremity support. Pt would benefit from skilled OT services to address noted impairments and functional limitations (see below for any additional details) in order to maximize safety and independence while minimizing falls risk and caregiver burden. Anticipate the need for follow up OT services upon acute hospital DC.       If  plan is discharge home, recommend the following: A lot of help with walking and/or transfers;A lot of help with bathing/dressing/bathroom;Direct supervision/assist for medications management;Direct supervision/assist for financial management;Assist for transportation;Help with stairs or ramp for entrance    Functional Status Assessment  Patient has had a recent decline in their functional status and demonstrates the ability to make significant improvements in function in a reasonable and predictable amount of time.  Equipment Recommendations  None recommended by OT (defer)    Recommendations for Other Services       Precautions / Restrictions Precautions Precautions: Fall Restrictions Weight Bearing Restrictions: Yes RLE Weight Bearing: Non weight bearing Other Position/Activity Restrictions: L skin tear cannot don prosthesis      Mobility Bed Mobility Overal bed mobility: Needs Assistance Bed Mobility: Sit to Supine, Supine to Sit     Supine to sit: Supervision Sit to supine: Supervision   General bed mobility comments: cues for safety    Transfers Overall transfer level: Needs assistance                 General transfer comment: unable to attempt this date      Balance Overall balance assessment: Needs assistance Sitting-balance support: Feet unsupported, No upper extremity supported Sitting balance-Leahy Scale: Good Sitting balance - Comments: good dynamic sitting balance without extremity support                                   ADL either performed or assessed with clinical judgement   ADL Overall ADL's :  Needs assistance/impaired                                       General ADL Comments: Pt denies ADL performance this date. Agreeable to performing bed mobility with cues + supervision. Anticipate pt will need education on lateral scoot transfers due to NWB on RLE and inability to don prosthesis on LLE, and overall  education/training on LB dressing using lateral leans. Likely MAX A for LB and transfers this date.     Vision Baseline Vision/History: 1 Wears glasses Patient Visual Report: No change from baseline              Pertinent Vitals/Pain Pain Assessment Pain Assessment: 0-10 Pain Score: 5  Pain Location: R leg Pain Descriptors / Indicators: Constant, Cramping, Discomfort Pain Intervention(s): Monitored during session, Limited activity within patient's tolerance     Extremity/Trunk Assessment Upper Extremity Assessment Upper Extremity Assessment: Overall WFL for tasks assessed;Right hand dominant (grossly 5/5 throughout, able to laterally scoot without BLE use on eval)   Lower Extremity Assessment Lower Extremity Assessment: Generalized weakness RLE Deficits / Details: NWB to RLE RLE: Unable to fully assess due to pain RLE Sensation: WNL RLE Coordination: WNL       Communication Communication Communication: No apparent difficulties   Cognition Arousal: Alert Behavior During Therapy: WFL for tasks assessed/performed Overall Cognitive Status: Within Functional Limits for tasks assessed                                 General Comments: daughters report that pt will likely move to long term care up [facility] after SNF stay     General Comments  Decreased L skin integrity            Home Living Family/patient expects to be discharged to:: Private residence (mobile home) Living Arrangements: Alone Available Help at Discharge: Available PRN/intermittently;Friend(s) Type of Home: Mobile home Home Access: Stairs to enter     Home Layout: One level     Bathroom Shower/Tub: Chief Strategy Officer: Standard     Home Equipment: None          Prior Functioning/Environment Prior Level of Function : Independent/Modified Independent             Mobility Comments: Independent ambulator. ADLs Comments: IND, including driving and IADLs         OT Problem List: Decreased strength;Decreased range of motion;Decreased activity tolerance;Impaired balance (sitting and/or standing);Decreased safety awareness;Decreased knowledge of use of DME or AE;Decreased knowledge of precautions;Pain      OT Treatment/Interventions: Self-care/ADL training;Therapeutic exercise;Energy conservation;DME and/or AE instruction;Therapeutic activities;Patient/family education;Balance training    OT Goals(Current goals can be found in the care plan section) Acute Rehab OT Goals OT Goal Formulation: With patient Time For Goal Achievement: 06/15/23 Potential to Achieve Goals: Fair  OT Frequency: Min 1X/week       AM-PAC OT "6 Clicks" Daily Activity     Outcome Measure Help from another person eating meals?: None Help from another person taking care of personal grooming?: None Help from another person toileting, which includes using toliet, bedpan, or urinal?: A Lot Help from another person bathing (including washing, rinsing, drying)?: A Lot Help from another person to put on and taking off regular upper body clothing?: A Little Help from another person to put on and  taking off regular lower body clothing?: A Lot 6 Click Score: 17   End of Session Nurse Communication: Mobility status  Activity Tolerance: Patient tolerated treatment well Patient left: in bed;with call bell/phone within reach;with bed alarm set  OT Visit Diagnosis: Unsteadiness on feet (R26.81);Muscle weakness (generalized) (M62.81);Other abnormalities of gait and mobility (R26.89)                Time: 1610-9604 OT Time Calculation (min): 23 min Charges:  OT General Charges $OT Visit: 1 Visit OT Evaluation $OT Eval Moderate Complexity: 1 Mod Kaavya Puskarich L. Drayke Grabel, OTR/L  06/01/23, 4:55 PM
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
## 2023-06-02 DIAGNOSIS — A419 Sepsis, unspecified organism: Secondary | ICD-10-CM | POA: Diagnosis not present

## 2023-06-02 DIAGNOSIS — N179 Acute kidney failure, unspecified: Secondary | ICD-10-CM | POA: Diagnosis not present

## 2023-06-02 DIAGNOSIS — I739 Peripheral vascular disease, unspecified: Secondary | ICD-10-CM | POA: Diagnosis not present

## 2023-06-02 DIAGNOSIS — E1152 Type 2 diabetes mellitus with diabetic peripheral angiopathy with gangrene: Secondary | ICD-10-CM | POA: Diagnosis not present

## 2023-06-02 LAB — CULTURE, BLOOD (ROUTINE X 2)
Culture: NO GROWTH
Culture: NO GROWTH
Special Requests: ADEQUATE

## 2023-06-02 LAB — GLUCOSE, CAPILLARY
Glucose-Capillary: 200 mg/dL — ABNORMAL HIGH (ref 70–99)
Glucose-Capillary: 182 mg/dL — ABNORMAL HIGH (ref 70–99)
Glucose-Capillary: 167 mg/dL — ABNORMAL HIGH (ref 70–99)
Glucose-Capillary: 175 mg/dL — ABNORMAL HIGH (ref 70–99)
Glucose-Capillary: 151 mg/dL — ABNORMAL HIGH (ref 70–99)

## 2023-06-02 MED ORDER — LACTULOSE 10 GM/15ML PO SOLN
15.0000 g | Freq: Two times a day (BID) | ORAL | Status: DC
  Administered 2023-06-02 – 2023-06-03 (×3): 15 g via ORAL
  Filled 2023-06-02 (×3): qty 30

## 2023-06-02 MED ORDER — INSULIN ASPART 100 UNIT/ML IJ SOLN
5.0000 [IU] | Freq: Three times a day (TID) | INTRAMUSCULAR | Status: DC
  Administered 2023-06-02 – 2023-06-11 (×22): 5 [IU] via SUBCUTANEOUS
  Filled 2023-06-02 (×23): qty 1

## 2023-06-02 MED ORDER — SODIUM CHLORIDE 0.9 % IV SOLN
3.0000 g | Freq: Four times a day (QID) | INTRAVENOUS | Status: DC
  Administered 2023-06-02 – 2023-06-05 (×11): 3 g via INTRAVENOUS
  Filled 2023-06-02 (×12): qty 8
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
## 2023-06-02 NOTE — Plan of Care (Signed)
  Problem: Education: Goal: Ability to describe self-care measures that may prevent or decrease complications (Diabetes Survival Skills Education) will improve Outcome: Progressing   Problem: Coping: Goal: Ability to adjust to condition or change in health will improve Outcome: Progressing   Problem: Fluid Volume: Goal: Ability to maintain a balanced intake and output will improve Outcome: Progressing   Problem: Metabolic: Goal: Ability to maintain appropriate glucose levels will improve Outcome: Progressing   Problem: Nutritional: Goal: Maintenance of adequate nutrition will improve Outcome: Progressing   Problem: Education: Goal: Knowledge of General Education information will improve Description: Including pain rating scale, medication(s)/side effects and non-pharmacologic comfort measures Outcome: Progressing

## 2023-06-02 NOTE — Progress Notes (Signed)
Progress Note   Patient: Allen Chavez VFI:[id number] DOB: [birth year] DOA: 05/28/2023     5 DOS: the patient was seen and examined on 06/02/2023   Brief hospital course:  Zaydan Papesh is a 67 y.o. male with medical history significant of insulin-dependent diabetes mellitus, hypertension, PAD and CKD stage IIIa presented to ED with generalized malaise and worsening right foot wound with concern of gangrene and surrounding erythema with tracking up to upper legs for the past 2 days.  Patient also endorsed some subjective fever and chills at home. He was having generalized malaise and poor p.o. intake for the past couple of days.  On presentation  Patient was afebrile, tachycardic at 112 and softer blood pressure 94/52.  Labs pertinent for leukocytosis at 18.1, absolute neutrophil 15.8, hemoglobin 11.5, lactic acid 3.7, sodium 133, CO2 21, glucose 157, BUN 45, creatinine 3.24 with GFR of 20, baseline seems to be around 1.4-1.5.  CXR with no active cardiopulmonary disease. Right foot imaging with asymmetric soft tissue swelling around fifth toe with some probable small amount of air in the soft tissue.  Also noted to have a small cortical breach in the distal phalanx, concerning for acute osteomyelitis.  MRI was ordered but pending at the time of interview. EKG-personally reviewed, sinus tachycardia with HR of 115, no significant ST-T changes.   Patient met severe sepsis criteria and blood cultures were drawn.  Received IV fluid and started on broad-spectrum antibiotics.  Vascular surgery and podiatry was consulted.  10/31: Afebrile this morning, maximum temperature recorded of 100.5 overnight.  Lactic acidosis resolved, as some improvement in leukocytosis and hemoglobin decreased to 9.3, all cell line decreased so likely some dilutional effect.  Significantly elevated CRP at 27.3, ESR 81, A1c 7.1, TSH elevated at 12.732, checking thyroid panel.  Preliminary blood cultures negative.  MRI of right foot with  concern of osteo of fifth toe, no discrete abscess.  Going for angiography with vascular today, followed by likely transmetatarsal amputation on Friday or Saturday Hypoglycemia as patient was n.p.o., switching fluids to LR with D5  11/1: S/p angiography and stents placement in right lower extremity.  Transmetatarsal amputation scheduled for tomorrow.  Creatinine slowly improving, at 1.92 today, improving leukocytosis And stable hemoglobin.  Anemia panel consistent with anemia of chronic disease with some iron and B12 deficiency.  B12 at 177.  Ordered IM replacement and also starting on supplement.  11/2: Patient had his right transmetatarsal amputation earlier today, tolerated the procedure well. Thyroid panel with some improvement in TSH to 7, increased T3 uptake but normal T4 and T3. Will need a repeat in 3 to 4 weeks by PCP.  11/3: Vital stable, creatinine continue to slowly improve, at 1.67 today.  Persistent leukocytosis likely some reactive aliment as patient had his TMA yesterday.  Preliminary wound culture with no organism but patient was already on antibiotics.  Pending PT/OT evaluation  11/4: Podiatry is concerned about a spot at his stump, will be high risk for BKA due to decreased blood supply.  PT is recommending SNF.  Cultures are growing group C strep, antibiotics switched with Unasyn   Assessment and Plan: * Severe sepsis [facility]) Patient met severe sepsis criteria with leukocytosis, tachycardia and endorgan damage manifested as AKI and lactic acidosis at 3.7, which has been resolved now Likely secondary to right foot gangrenous wound. Received IV fluid per sepsis protocol and started on broad-spectrum antibiotics.  Blood cultures remain negative, wound cultures are growing group C strep Podiatry and vascular surgery  was consulted, s/p angiography and PCI to RLE Patient had his transmetatarsal amputation on 05/31/2023 -Follow-up intraoperative cultures-growing group C  strep -Antibiotics switched with Unasyn -PT/OT evaluation-patient will be nonweightbearing and they are recommending SNF. -Patient is high risk for BKA per podiatry  AKI (acute kidney injury) (HCC) History of CKD stage IIIa.  Baseline creatinine seems to be around 1.4-1.5 on care everywhere. Creatinine at 3.25 on admission which is slowly improving, currently at 1.67 -Encourage p.o. hydration -Monitor renal function  Peripheral arterial disease (HCC) S/p left BKA many years ago.  Had angiography with stents placement on right lower extremity yesterday with vascular surgery Patient was not on any antiplatelet before so he was started on aspirin and Plavix -Continue statin  Type 2 diabetes mellitus (HCC) Patient uses 60 units of Lantus with NovoLog at home. Recent A1c in care everywhere at 7.7 checked in September 2024 -Semglee 20 units twice daily -SSI  Essential (primary) hypertension Patient was on lisinopril at home.  Blood pressure soft on admission. -Holding lisinopril for AKI and sepsis -Monitor blood pressure  Hyperlipidemia -Continue home statin  GERD (gastroesophageal reflux disease) -Continue PPI  Malignant neoplasm of prostate [facility]) Patient has an history of prostate malignancy. Recent PSA checked last month was undetectable. -Outpatient follow-up  Normocytic anemia Hemoglobin currently stable at 9.3, baseline around 12-13. No obvious bleeding.  Likely some dilutional effect Anemia panel consistent with anemia of chronic disease with some iron and B12 deficiency.  B12 at 177 with a goal above 400 -Ordered parenteral B12 supplement for 3 days -Started on p.o. supplement -Monitor hemoglobin  Elevated TSH Elevated TSH above 12, no prior diagnosis of hypothyroidism.  On repeat TSH was 7, normal total T4 and free thyroxine index, slightly increased T3 uptake ratio. -Patient will need repeat levels by PCP in 3 to 4 weeks    Subjective: Patient was seen and  examined today.  Denies any pain.  Had a lengthy discussion with patient and his daughter regarding being high risk for BKA.  Physical Exam: Vitals:   06/01/23 1523 06/01/23 2116 06/02/23 0500 06/02/23 0738  BP: 132/70 121/65  133/61  Pulse: 87 90  77  Resp: 14     Temp: 98.6 F (37 C)   98.6 F (37 C)  TempSrc: Oral     SpO2: 94% 93%  93%  Weight:   84.4 kg   Height:       General.  Frail gentleman, in no acute distress. Pulmonary.  Lungs clear bilaterally, normal respiratory effort. CV.  Regular rate and rhythm, no JVD, rub or murmur. Abdomen.  Soft, nontender, nondistended, BS positive. CNS.  Alert and oriented .  No focal neurologic deficit. Extremities.  Right TMA with bandage, left BKA Psychiatry.  Judgment and insight appears normal.     Data Reviewed: Prior data reviewed  Family Communication: Discussed with daughter at bedside  Disposition: Status is: Inpatient Remains inpatient appropriate because: Prior data reviewed  Planned Discharge Destination: SNF  DVT prophylaxis.  Subcu heparin Time spent: 45 minutes  This record has been created using Conservation officer, historic buildings. Errors have been sought and corrected,but may not always be located. Such creation errors do not reflect on the standard of care.   Author: Arnetha Courser, MD 06/02/2023 2:51 PM  For on call review [email or web address].

## 2023-06-02 NOTE — TOC Progression Note (Signed)
Transition of Care [facility]) - Progression Note    Patient Details  Name: Allen Chavez MRN: [id number] Date of Birth: 1955-11-02  Transition of Care [facility]) CM/SW Contact  Marlowe Sax, RN Phone Number: 06/02/2023, 11:25 AM  Clinical Narrative:    Met with the patient and his daughter in the room, he lives alone and is agreeable to go to SNF, Bed search started, I explained the Ins approval process as well, they stated understanding  Expected Discharge Plan: Skilled Nursing Facility Barriers to Discharge: SNF Pending bed offer, Insurance Authorization  Expected Discharge Plan and Services In-house Referral: Clinical Social Work Discharge Planning Services: Other - See comment (Pending)   Living arrangements for the past 2 months: Single Family Home                 DME Arranged: N/A DME Agency: NA       HH Arranged: NA (Pending) HH Agency: NA (Pending)         Social Determinants of Health (SDOH) Interventions SDOH Screenings   Food Insecurity: No Food Insecurity (05/28/2023)  Housing: Low Risk  (05/28/2023)  Transportation Needs: No Transportation Needs (05/28/2023)  Utilities: Not At Risk (05/28/2023)  Financial Resource Strain: Patient Declined (09/30/2022)   Received from [facility], [facility]  Tobacco Use: High Risk (05/28/2023)    Readmission Risk Interventions     No data to display

## 2023-06-02 NOTE — Plan of Care (Signed)

## 2023-06-02 NOTE — [place] FL2 (Signed)
[place] MEDICAID FL2 LEVEL OF CARE FORM     IDENTIFICATION  Patient Name: Allen Chavez Birthdate: July 24, 1956 Sex: male Admission Date (Current Location): 05/28/2023  [facility] and [place] Number:  Chiropodist and Address:  [facility], [address], [place], [place] [address]      Provider Number: [id number]  Attending Physician Name and Address:  Arnetha Courser, MD  Relative Name and Phone Number:  Nemiah Commander Daughtyer [phone]    Current Level of Care: Hospital Recommended Level of Care: Skilled Nursing Facility Prior Approval Number:    Date Approved/Denied:   PASRR Number: [id number]  Discharge Plan: SNF    Current Diagnoses: Patient Active Problem List   Diagnosis Date Noted   Elevated TSH 05/29/2023   Normocytic anemia 05/29/2023   Severe sepsis (HCC) 05/28/2023   Peripheral arterial disease (HCC) 05/28/2023   Hyperlipidemia 05/28/2023   AKI (acute kidney injury) (HCC) 05/28/2023   GERD (gastroesophageal reflux disease) 05/28/2023   Gangrene of right foot (HCC) 05/28/2023   Necrotizing subcutaneous infection (HCC) 05/28/2023   Malignant neoplasm of prostate (HCC) 08/01/2014   Essential (primary) hypertension 08/01/2014   Type 2 diabetes mellitus (HCC) 08/01/2014    Orientation RESPIRATION BLADDER Height & Weight     Self, Time, Situation, Place  Normal Continent Weight: 84.4 kg Height:  6\' 2"  (188 cm)  BEHAVIORAL SYMPTOMS/MOOD NEUROLOGICAL BOWEL NUTRITION STATUS      Continent Diet (See DC summary)  AMBULATORY STATUS COMMUNICATION OF NEEDS Skin   Extensive Assist Verbally Normal, Surgical wounds                       Personal Care Assistance Level of Assistance  Bathing, Feeding, Dressing Bathing Assistance: Limited assistance Feeding assistance: Limited assistance Dressing Assistance: Maximum assistance     Functional Limitations Info  Sight, Speech, Hearing Sight Info: Adequate Hearing Info:  Adequate Speech Info: Adequate    SPECIAL CARE FACTORS FREQUENCY  PT (By licensed PT), OT (By licensed OT)     PT Frequency: 5 times per week OT Frequency: 5 times per week            Contractures Contractures Info: Not present    Additional Factors Info  Code Status, Allergies Code Status Info: full code Allergies Info: NKDA           Current Medications (06/02/2023):  This is the current hospital active medication list Current Facility-Administered Medications  Medication Dose Route Frequency Provider Last Rate Last Admin   acetaminophen (TYLENOL) tablet 650 mg  650 mg Oral Q6H PRN Louann Sjogren, DPM   650 mg at 05/30/23 0308   Or   acetaminophen (TYLENOL) suppository 650 mg  650 mg Rectal Q6H PRN Louann Sjogren, DPM       aspirin EC tablet 81 mg  81 mg Oral Daily Louann Sjogren, DPM   81 mg at 06/02/23 0841   atorvastatin (LIPITOR) tablet 40 mg  40 mg Oral Daily Louann Sjogren, DPM   40 mg at 06/02/23 0841   ceFEPIme (MAXIPIME) 2 g in sodium chloride 0.9 % 100 mL IVPB  2 g Intravenous Q12H Louann Sjogren, DPM 200 mL/hr at 06/02/23 0843 2 g at 06/02/23 0843   clopidogrel (PLAVIX) tablet 75 mg  75 mg Oral Daily Louann Sjogren, DPM   75 mg at 06/02/23 4132   Fe Fum-Vit C-Vit B12-FA (TRIGELS-F FORTE) capsule 1 capsule  1 capsule Oral BID Louann Sjogren, DPM   1 capsule at  06/02/23 0841   feeding supplement (ENSURE ENLIVE / ENSURE PLUS) liquid 237 mL  237 mL Oral BID BM Louann Sjogren, DPM   237 mL at 06/02/23 0842   fenofibrate tablet 160 mg  160 mg Oral Daily Louann Sjogren, DPM   160 mg at 06/02/23 0841   heparin injection 5,000 Units  5,000 Units Subcutaneous Q8H Louann Sjogren, DPM   5,000 Units at 06/02/23 0517   insulin aspart (novoLOG) injection 0-15 Units  0-15 Units Subcutaneous TID WC Arnetha Courser, MD   3 Units at 06/02/23 0841   insulin aspart (novoLOG) injection 0-5 Units  0-5 Units Subcutaneous QHS Arnetha Courser, MD   3 Units at 06/01/23 2137   insulin  aspart (novoLOG) injection 5 Units  5 Units Subcutaneous TID WC Arnetha Courser, MD       insulin glargine-yfgn (SEMGLEE) injection 20 Units  20 Units Subcutaneous BID Louann Sjogren, DPM   20 Units at 06/02/23 0840   melatonin tablet 5 mg  5 mg Oral QHS Arnetha Courser, MD   5 mg at 06/01/23 2122   metroNIDAZOLE (FLAGYL) IVPB 500 mg  500 mg Intravenous Q12H Louann Sjogren, DPM 100 mL/hr at 06/02/23 0520 500 mg at 06/02/23 0520   ondansetron (ZOFRAN) tablet 4 mg  4 mg Oral Q6H PRN Louann Sjogren, DPM       Or   ondansetron (ZOFRAN) injection 4 mg  4 mg Intravenous Q6H PRN Louann Sjogren, DPM       oxyCODONE (Oxy IR/ROXICODONE) immediate release tablet 5 mg  5 mg Oral Q4H PRN Louann Sjogren, DPM   5 mg at 06/01/23 0006   pantoprazole (PROTONIX) EC tablet 40 mg  40 mg Oral Daily Louann Sjogren, DPM   40 mg at 06/02/23 0842   polyethylene glycol (MIRALAX / GLYCOLAX) packet 17 g  17 g Oral BID Arnetha Courser, MD   17 g at 06/02/23 0837   sodium chloride flush (NS) 0.9 % injection 3 mL  3 mL Intravenous Q12H Louann Sjogren, DPM   3 mL at 06/01/23 2138   vancomycin (VANCOREADY) IVPB 1250 mg/250 mL  1,250 mg Intravenous Q24H Mila Merry A, RPH 166.7 mL/hr at 06/01/23 1153 1,250 mg at 06/01/23 1153     Discharge Medications: Please see discharge summary for a list of discharge medications.  Relevant Imaging Results:  Relevant Lab Results:   Additional Information SS# [id number]  Marlowe Sax, RN

## 2023-06-03 DIAGNOSIS — A419 Sepsis, unspecified organism: Secondary | ICD-10-CM | POA: Diagnosis not present

## 2023-06-03 DIAGNOSIS — N179 Acute kidney failure, unspecified: Secondary | ICD-10-CM | POA: Diagnosis not present

## 2023-06-03 DIAGNOSIS — E1152 Type 2 diabetes mellitus with diabetic peripheral angiopathy with gangrene: Secondary | ICD-10-CM | POA: Diagnosis not present

## 2023-06-03 DIAGNOSIS — I739 Peripheral vascular disease, unspecified: Secondary | ICD-10-CM | POA: Diagnosis not present

## 2023-06-03 LAB — CREATININE, SERUM
Creatinine, Ser: 1.51 mg/dL — ABNORMAL HIGH (ref 0.61–1.24)
GFR, Estimated: 50 mL/min — ABNORMAL LOW (ref >60)

## 2023-06-03 LAB — GLUCOSE, CAPILLARY
Glucose-Capillary: 154 mg/dL — ABNORMAL HIGH (ref 70–99)
Glucose-Capillary: 205 mg/dL — ABNORMAL HIGH (ref 70–99)
Glucose-Capillary: 140 mg/dL — ABNORMAL HIGH (ref 70–99)
Glucose-Capillary: 110 mg/dL — ABNORMAL HIGH (ref 70–99)

## 2023-06-03 MED ORDER — LACTULOSE 10 GM/15ML PO SOLN
30.0000 g | Freq: Two times a day (BID) | ORAL | Status: DC
  Administered 2023-06-03 – 2023-06-10 (×3): 30 g via ORAL
  Filled 2023-06-03 (×11): qty 60

## 2023-06-03 NOTE — Progress Notes (Signed)
Progress Note   Patient: Allen Chavez YQI:[id number] DOB: 1955-11-14 DOA: 05/28/2023     6 DOS: the patient was seen and examined on 06/03/2023   Brief hospital course:  Haston Casebolt is a 67 y.o. male with medical history significant of insulin-dependent diabetes mellitus, hypertension, PAD and CKD stage IIIa presented to ED with generalized malaise and worsening right foot wound with concern of gangrene and surrounding erythema with tracking up to upper legs for the past 2 days.  Patient also endorsed some subjective fever and chills at home. He was having generalized malaise and poor p.o. intake for the past couple of days.  On presentation  Patient was afebrile, tachycardic at 112 and softer blood pressure 94/52.  Labs pertinent for leukocytosis at 18.1, absolute neutrophil 15.8, hemoglobin 11.5, lactic acid 3.7, sodium 133, CO2 21, glucose 157, BUN 45, creatinine 3.24 with GFR of 20, baseline seems to be around 1.4-1.5.  CXR with no active cardiopulmonary disease. Right foot imaging with asymmetric soft tissue swelling around fifth toe with some probable small amount of air in the soft tissue.  Also noted to have a small cortical breach in the distal phalanx, concerning for acute osteomyelitis.  MRI was ordered but pending at the time of interview. EKG-personally reviewed, sinus tachycardia with HR of 115, no significant ST-T changes.   Patient met severe sepsis criteria and blood cultures were drawn.  Received IV fluid and started on broad-spectrum antibiotics.  Vascular surgery and podiatry was consulted.  10/31: Afebrile this morning, maximum temperature recorded of 100.5 overnight.  Lactic acidosis resolved, as some improvement in leukocytosis and hemoglobin decreased to 9.3, all cell line decreased so likely some dilutional effect.  Significantly elevated CRP at 27.3, ESR 81, A1c 7.1, TSH elevated at 12.732, checking thyroid panel.  Preliminary blood cultures negative.  MRI of right foot with  concern of osteo of fifth toe, no discrete abscess.  Going for angiography with vascular today, followed by likely transmetatarsal amputation on Friday or Saturday Hypoglycemia as patient was n.p.o., switching fluids to LR with D5  11/1: S/p angiography and stents placement in right lower extremity.  Transmetatarsal amputation scheduled for tomorrow.  Creatinine slowly improving, at 1.92 today, improving leukocytosis And stable hemoglobin.  Anemia panel consistent with anemia of chronic disease with some iron and B12 deficiency.  B12 at 177.  Ordered IM replacement and also starting on supplement.  11/2: Patient had his right transmetatarsal amputation earlier today, tolerated the procedure well. Thyroid panel with some improvement in TSH to 7, increased T3 uptake but normal T4 and T3. Will need a repeat in 3 to 4 weeks by PCP.  11/3: Vital stable, creatinine continue to slowly improve, at 1.67 today.  Persistent leukocytosis likely some reactive aliment as patient had his TMA yesterday.  Preliminary wound culture with no organism but patient was already on antibiotics.  Pending PT/OT evaluation  11/4: Podiatry is concerned about a spot at his stump, will be high risk for BKA due to decreased blood supply.  PT is recommending SNF.  Cultures are growing group C strep, antibiotics switched with Unasyn.  11/5: Patient with failing TMA flap, vascular surgery was reconsulted and patient will be going for BKA on right, likely tomorrow depending OR availability.   Assessment and Plan: * Severe sepsis [facility]) Patient met severe sepsis criteria with leukocytosis, tachycardia and endorgan damage manifested as AKI and lactic acidosis at 3.7, which has been resolved now Likely secondary to right foot gangrenous wound. Received IV  fluid per sepsis protocol and started on broad-spectrum antibiotics.  Blood cultures remain negative, wound cultures are growing group C strep Podiatry and vascular surgery was  consulted, s/p angiography and PCI to RLE Patient had his transmetatarsal amputation on 05/31/2023 -Follow-up intraoperative cultures-growing group C strep -Antibiotics switched with Unasyn -PT/OT evaluation-patient will be nonweightbearing and they are recommending SNF. -Patient now failing flap for TMA, podiatry reconsulted vascular surgery and patient will be going for right BKA in 1 to 2 days depending availability of OR.  AKI (acute kidney injury) (HCC) History of CKD stage IIIa.  Baseline creatinine seems to be around 1.4-1.5 on care everywhere. Creatinine at 3.25 on admission which is slowly improving, currently at 1.51 -Encourage p.o. hydration -Monitor renal function  Peripheral arterial disease (HCC) S/p left BKA many years ago.  Had angiography with stents placement on right lower extremity yesterday with vascular surgery Patient was not on any antiplatelet before so he was started on aspirin and Plavix -Continue statin  Type 2 diabetes mellitus (HCC) Patient uses 60 units of Lantus with NovoLog at home. Recent A1c in care everywhere at 7.7 checked in September 2024 -Semglee 20 units twice daily -SSI  Essential (primary) hypertension Patient was on lisinopril at home.  Blood pressure soft on admission. -Holding lisinopril for AKI and sepsis -Monitor blood pressure  Hyperlipidemia -Continue home statin  GERD (gastroesophageal reflux disease) -Continue PPI  Malignant neoplasm of prostate [place]) Patient has an history of prostate malignancy. Recent PSA checked last month was undetectable. -Outpatient follow-up  Normocytic anemia Hemoglobin currently stable at 9.3, baseline around 12-13. No obvious bleeding.  Likely some dilutional effect Anemia panel consistent with anemia of chronic disease with some iron and B12 deficiency.  B12 at 177 with a goal above 400 -Ordered parenteral B12 supplement for 3 days -Started on p.o. supplement -Monitor hemoglobin  Elevated  TSH Elevated TSH above 12, no prior diagnosis of hypothyroidism.  On repeat TSH was 7, normal total T4 and free thyroxine index, slightly increased T3 uptake ratio. -Patient will need repeat levels by PCP in 3 to 4 weeks    Subjective: Patient was seen and examined today.  Denies any pain.  Did not had any bowel movement yet so bowel regimen was liberalized.  Physical Exam: Vitals:   06/03/23 0007 06/03/23 0500 06/03/23 0819 06/03/23 1114  BP: 139/69  (!) 145/75 (!) 117/54  Pulse: 81  79 79  Resp: 18  20 20   Temp: 99.5 F (37.5 C)  98.4 F (36.9 C) 98.9 F (37.2 C)  TempSrc:      SpO2: 95%  95% 95%  Weight:  79.4 kg    Height:       General.  Frail elderly man, in no acute distress. Pulmonary.  Lungs clear bilaterally, normal respiratory effort. CV.  Regular rate and rhythm, no JVD, rub or murmur. Abdomen.  Soft, nontender, nondistended, BS positive. CNS.  Alert and oriented .  No focal neurologic deficit. Extremities.  Right TMA with bandage and some surrounding ankle edema, left BKA Psychiatry.  Judgment and insight appears normal.   Data Reviewed: Prior data reviewed  Family Communication: Discussed with daughter at bedside  Disposition: Status is: Inpatient Remains inpatient appropriate because: Prior data reviewed  Planned Discharge Destination: SNF  DVT prophylaxis.  Subcu heparin Time spent: 45 minutes  This record has been created using Conservation officer, historic buildings. Errors have been sought and corrected,but may not always be located. Such creation errors do not reflect on  the standard of care.   Author: Arnetha Courser, MD 06/03/2023 4:15 PM  For on call review [email or web address].

## 2023-06-03 NOTE — Plan of Care (Signed)
  Problem: Education: Goal: Ability to describe self-care measures that may prevent or decrease complications (Diabetes Survival Skills Education) will improve Outcome: Progressing Goal: Individualized Educational Video(s) Outcome: Progressing   Problem: Coping: Goal: Ability to adjust to condition or change in health will improve Outcome: Progressing   Problem: Fluid Volume: Goal: Ability to maintain a balanced intake and output will improve Outcome: Progressing   Problem: Health Behavior/Discharge Planning: Goal: Ability to identify and utilize available resources and services will improve Outcome: Progressing Goal: Ability to manage health-related needs will improve Outcome: Progressing   Problem: Metabolic: Goal: Ability to maintain appropriate glucose levels will improve Outcome: Progressing   Problem: Nutritional: Goal: Maintenance of adequate nutrition will improve Outcome: Progressing Goal: Progress toward achieving an optimal weight will improve Outcome: Progressing   Problem: Skin Integrity: Goal: Risk for impaired skin integrity will decrease Outcome: Progressing   Problem: Tissue Perfusion: Goal: Adequacy of tissue perfusion will improve Outcome: Progressing   Problem: Education: Goal: Knowledge of General Education information will improve Description: Including pain rating scale, medication(s)/side effects and non-pharmacologic comfort measures Outcome: Progressing   Problem: Health Behavior/Discharge Planning: Goal: Ability to manage health-related needs will improve Outcome: Progressing   Problem: Clinical Measurements: Goal: Ability to maintain clinical measurements within normal limits will improve Outcome: Progressing Goal: Will remain free from infection Outcome: Progressing Goal: Diagnostic test results will improve Outcome: Progressing Goal: Respiratory complications will improve Outcome: Progressing Goal: Cardiovascular complication will  be avoided Outcome: Progressing   Problem: Activity: Goal: Risk for activity intolerance will decrease Outcome: Progressing   Problem: Nutrition: Goal: Adequate nutrition will be maintained Outcome: Progressing   Problem: Coping: Goal: Level of anxiety will decrease Outcome: Progressing   Problem: Elimination: Goal: Will not experience complications related to bowel motility Outcome: Progressing Goal: Will not experience complications related to urinary retention Outcome: Progressing   Problem: Pain Management: Goal: General experience of comfort will improve Outcome: Progressing   Problem: Safety: Goal: Ability to remain free from injury will improve Outcome: Progressing   Problem: Skin Integrity: Goal: Risk for impaired skin integrity will decrease Outcome: Progressing   Problem: Education: Goal: Knowledge of General Education information will improve Description: Including pain rating scale, medication(s)/side effects and non-pharmacologic comfort measures Outcome: Progressing   Problem: Health Behavior/Discharge Planning: Goal: Ability to manage health-related needs will improve Outcome: Progressing   Problem: Clinical Measurements: Goal: Ability to maintain clinical measurements within normal limits will improve Outcome: Progressing Goal: Will remain free from infection Outcome: Progressing Goal: Diagnostic test results will improve Outcome: Progressing Goal: Respiratory complications will improve Outcome: Progressing Goal: Cardiovascular complication will be avoided Outcome: Progressing   Problem: Activity: Goal: Risk for activity intolerance will decrease Outcome: Progressing   Problem: Nutrition: Goal: Adequate nutrition will be maintained Outcome: Progressing   Problem: Coping: Goal: Level of anxiety will decrease Outcome: Progressing   Problem: Elimination: Goal: Will not experience complications related to bowel motility Outcome:  Progressing Goal: Will not experience complications related to urinary retention Outcome: Progressing   Problem: Pain Management: Goal: General experience of comfort will improve Outcome: Progressing   Problem: Safety: Goal: Ability to remain free from injury will improve Outcome: Progressing   Problem: Skin Integrity: Goal: Risk for impaired skin integrity will decrease Outcome: Progressing

## 2023-06-03 NOTE — Plan of Care (Signed)
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
## 2023-06-03 NOTE — TOC Progression Note (Signed)
Transition of Care [facility]) - Progression Note    Patient Details  Name: Allen Chavez MRN: [id number] Date of Birth: December 30, 1955  Transition of [facility]) CM/SW Contact  Marlowe Sax, RN Phone Number: 06/03/2023, 11:07 AM  Clinical Narrative:    Met with the patient and his daughter in the room at the bedside, I reviewed the 2 bed offers, I reached out to Tiffany at [employer] asking her to review the patient for possible bed offer, she will review, he will need to have  BM and decide on bed then get ins approval before DC   Expected Discharge Plan: Skilled Nursing Facility Barriers to Discharge: SNF Pending bed offer, Insurance Authorization  Expected Discharge Plan and Services In-house Referral: Clinical Social Work Discharge Planning Services: Other - See comment (Pending)   Living arrangements for the past 2 months: Single Family Home                 DME Arranged: N/A DME Agency: NA       HH Arranged: NA (Pending) HH Agency: NA (Pending)         Social Determinants of Health (SDOH) Interventions SDOH Screenings   Food Insecurity: No Food Insecurity (05/28/2023)  Housing: Low Risk  (05/28/2023)  Transportation Needs: No Transportation Needs (05/28/2023)  Utilities: Not At Risk (05/28/2023)  Financial Resource Strain: Patient Declined (09/30/2022)   Received from [facility]  Tobacco Use: High Risk (05/28/2023)    Readmission Risk Interventions     No data to display

## 2023-06-04 ENCOUNTER — Other Ambulatory Visit: Payer: Self-pay

## 2023-06-04 ENCOUNTER — Encounter: Payer: Self-pay | Admitting: Internal Medicine

## 2023-06-04 ENCOUNTER — Inpatient Hospital Stay: Payer: Medicare PPO | Admitting: Registered Nurse

## 2023-06-04 ENCOUNTER — Encounter
Admission: EM | Disposition: A | Discharge status: Skilled Nursing Facility | Payer: Self-pay | Source: Home / Self Care | Attending: Internal Medicine

## 2023-06-04 DIAGNOSIS — A419 Sepsis, unspecified organism: Secondary | ICD-10-CM | POA: Diagnosis not present

## 2023-06-04 DIAGNOSIS — Z9889 Other specified postprocedural states: Secondary | ICD-10-CM

## 2023-06-04 DIAGNOSIS — R6520 Severe sepsis without septic shock: Secondary | ICD-10-CM | POA: Diagnosis not present

## 2023-06-04 DIAGNOSIS — Z89431 Acquired absence of right foot: Secondary | ICD-10-CM

## 2023-06-04 HISTORY — PX: AMPUTATION: SHX166

## 2023-06-04 LAB — CBC
HCT: 30.7 % — ABNORMAL LOW (ref 39.0–52.0)
Hemoglobin: 10.6 g/dL — ABNORMAL LOW (ref 13.0–17.0)
MCH: 27.8 pg (ref 26.0–34.0)
MCHC: 34.5 g/dL (ref 30.0–36.0)
MCV: 80.6 fL (ref 80.0–100.0)
Platelets: 465 10*3/uL — ABNORMAL HIGH (ref 150–400)
RBC: 3.81 MIL/uL — ABNORMAL LOW (ref 4.22–5.81)
RDW: 16.3 % — ABNORMAL HIGH (ref 11.5–15.5)
WBC: 16.7 10*3/uL — ABNORMAL HIGH (ref 4.0–10.5)
nRBC: 0 % (ref 0.0–0.2)

## 2023-06-04 LAB — BASIC METABOLIC PANEL
Anion gap: 7 (ref 5–15)
BUN: 30 mg/dL — ABNORMAL HIGH (ref 8–23)
CO2: 23 mmol/L (ref 22–32)
Calcium: 8.4 mg/dL — ABNORMAL LOW (ref 8.9–10.3)
Chloride: 110 mmol/L (ref 98–111)
Creatinine, Ser: 1.37 mg/dL — ABNORMAL HIGH (ref 0.61–1.24)
GFR, Estimated: 56 mL/min — ABNORMAL LOW (ref >60)
Glucose, Bld: 81 mg/dL (ref 70–99)
Potassium: 3.5 mmol/L (ref 3.5–5.1)
Sodium: 140 mmol/L (ref 135–145)

## 2023-06-04 LAB — GLUCOSE, CAPILLARY
Glucose-Capillary: 91 mg/dL (ref 70–99)
Glucose-Capillary: 145 mg/dL — ABNORMAL HIGH (ref 70–99)
Glucose-Capillary: 200 mg/dL — ABNORMAL HIGH (ref 70–99)
Glucose-Capillary: 173 mg/dL — ABNORMAL HIGH (ref 70–99)
Glucose-Capillary: 203 mg/dL — ABNORMAL HIGH (ref 70–99)

## 2023-06-04 LAB — SURGICAL PATHOLOGY

## 2023-06-04 SURGERY — AMPUTATION BELOW KNEE
Anesthesia: General | Site: Knee | Laterality: Right

## 2023-06-04 MED ORDER — LIDOCAINE HCL (PF) 2 % IJ SOLN
INTRAMUSCULAR | Status: AC
  Filled 2023-06-04: qty 5

## 2023-06-04 MED ORDER — MIDAZOLAM HCL 2 MG/2ML IJ SOLN
INTRAMUSCULAR | Status: DC | PRN
  Administered 2023-06-04: 1 mg via INTRAVENOUS

## 2023-06-04 MED ORDER — OXYCODONE HCL 5 MG PO TABS
5.0000 mg | ORAL_TABLET | Freq: Once | ORAL | Status: AC
  Administered 2023-06-04: 5 mg via ORAL

## 2023-06-04 MED ORDER — FENTANYL CITRATE (PF) 100 MCG/2ML IJ SOLN
INTRAMUSCULAR | Status: AC
  Filled 2023-06-04: qty 2

## 2023-06-04 MED ORDER — PROPOFOL 10 MG/ML IV BOLUS
INTRAVENOUS | Status: DC | PRN
  Administered 2023-06-04: 120 mg via INTRAVENOUS
  Administered 2023-06-04: 30 mg via INTRAVENOUS

## 2023-06-04 MED ORDER — VITAMIN C 500 MG PO TABS
500.0000 mg | ORAL_TABLET | Freq: Two times a day (BID) | ORAL | Status: DC
  Administered 2023-06-04 – 2023-06-11 (×14): 500 mg via ORAL
  Filled 2023-06-04 (×14): qty 1

## 2023-06-04 MED ORDER — FENTANYL CITRATE (PF) 100 MCG/2ML IJ SOLN
INTRAMUSCULAR | Status: DC | PRN
  Administered 2023-06-04 (×2): 50 ug via INTRAVENOUS
  Administered 2023-06-04 (×2): 25 ug via INTRAVENOUS

## 2023-06-04 MED ORDER — PHENYLEPHRINE 80 MCG/ML (10ML) SYRINGE FOR IV PUSH (FOR BLOOD PRESSURE SUPPORT)
PREFILLED_SYRINGE | INTRAVENOUS | Status: DC | PRN
  Administered 2023-06-04: 160 ug via INTRAVENOUS
  Administered 2023-06-04: 80 ug via INTRAVENOUS
  Administered 2023-06-04: 160 ug via INTRAVENOUS
  Administered 2023-06-04: 80 ug via INTRAVENOUS

## 2023-06-04 MED ORDER — OXYCODONE HCL 5 MG PO TABS
ORAL_TABLET | ORAL | Status: AC
  Filled 2023-06-04: qty 1

## 2023-06-04 MED ORDER — EPHEDRINE 5 MG/ML INJ
INTRAVENOUS | Status: AC
  Filled 2023-06-04: qty 10

## 2023-06-04 MED ORDER — DROPERIDOL 2.5 MG/ML IJ SOLN: 0.6250 mg | Freq: Once | INTRAMUSCULAR | Status: DC | PRN

## 2023-06-04 MED ORDER — DEXTROSE 50 % IV SOLN
INTRAVENOUS | Status: AC
  Filled 2023-06-04: qty 50

## 2023-06-04 MED ORDER — 0.9 % SODIUM CHLORIDE (POUR BTL) OPTIME
TOPICAL | Status: DC | PRN
  Administered 2023-06-04: 500 mL

## 2023-06-04 MED ORDER — DEXTROSE 50 % IV SOLN
0.5000 | Freq: Once | INTRAVENOUS | Status: AC
  Administered 2023-06-04: 25 mL via INTRAVENOUS

## 2023-06-04 MED ORDER — JUVEN PO PACK
1.0000 | PACK | Freq: Two times a day (BID) | ORAL | Status: DC
Start: 2023-06-04 — End: 2023-06-11
  Administered 2023-06-04 – 2023-06-09 (×6): 1 via ORAL

## 2023-06-04 MED ORDER — ZINC SULFATE 220 (50 ZN) MG PO CAPS
220.0000 mg | ORAL_CAPSULE | Freq: Every day | ORAL | Status: DC
  Administered 2023-06-04 – 2023-06-11 (×7): 220 mg via ORAL
  Filled 2023-06-04 (×7): qty 1

## 2023-06-04 MED ORDER — ONDANSETRON HCL 4 MG/2ML IJ SOLN
INTRAMUSCULAR | Status: AC
  Filled 2023-06-04: qty 2

## 2023-06-04 MED ORDER — SODIUM CHLORIDE 0.9 % IV SOLN: INTRAVENOUS | Status: DC | PRN

## 2023-06-04 MED ORDER — FENTANYL CITRATE (PF) 100 MCG/2ML IJ SOLN
25.0000 ug | INTRAMUSCULAR | Status: DC | PRN
  Administered 2023-06-04: 25 ug via INTRAVENOUS
  Administered 2023-06-04: 50 ug via INTRAVENOUS
  Administered 2023-06-04: 25 ug via INTRAVENOUS
  Administered 2023-06-04: 50 ug via INTRAVENOUS

## 2023-06-04 MED ORDER — HYDROMORPHONE HCL 1 MG/ML IJ SOLN
0.5000 mg | INTRAMUSCULAR | Status: DC | PRN
  Administered 2023-06-04 – 2023-06-08 (×14): 0.5 mg via INTRAVENOUS
  Filled 2023-06-04 (×14): qty 0.5

## 2023-06-04 MED ORDER — PROPOFOL 10 MG/ML IV BOLUS
INTRAVENOUS | Status: AC
  Filled 2023-06-04: qty 20

## 2023-06-04 MED ORDER — MIDAZOLAM HCL 2 MG/2ML IJ SOLN
INTRAMUSCULAR | Status: AC
  Filled 2023-06-04: qty 2

## 2023-06-04 MED ORDER — EPHEDRINE SULFATE-NACL 50-0.9 MG/10ML-% IV SOSY
PREFILLED_SYRINGE | INTRAVENOUS | Status: DC | PRN
  Administered 2023-06-04 (×2): 10 mg via INTRAVENOUS
  Administered 2023-06-04: 5 mg via INTRAVENOUS

## 2023-06-04 MED ORDER — ADULT MULTIVITAMIN W/MINERALS CH
1.0000 | ORAL_TABLET | Freq: Every day | ORAL | Status: DC
  Administered 2023-06-04 – 2023-06-11 (×7): 1 via ORAL
  Filled 2023-06-04 (×7): qty 1

## 2023-06-04 MED ORDER — LIDOCAINE HCL (CARDIAC) PF 100 MG/5ML IV SOSY
PREFILLED_SYRINGE | INTRAVENOUS | Status: DC | PRN
  Administered 2023-06-04: 100 mg via INTRAVENOUS

## 2023-06-04 MED ORDER — PROPOFOL 10 MG/ML IV BOLUS
INTRAVENOUS | Status: AC
  Filled 2023-06-04: qty 40

## 2023-06-04 SURGICAL SUPPLY — 41 items
APL PRP STRL LF DISP 70% ISPRP (MISCELLANEOUS) ×2
BLADE SAGITTAL WIDE XTHICK NO (BLADE) IMPLANT
BLADE SAW SAG 25.4X90 (BLADE) ×1 IMPLANT
BNDG CMPR 5X4 CHSV STRCH STRL (GAUZE/BANDAGES/DRESSINGS) ×2
BNDG CMPR STD VLCR NS LF 5.8X6 (GAUZE/BANDAGES/DRESSINGS) ×1
BNDG COHESIVE 4X5 TAN STRL LF (GAUZE/BANDAGES/DRESSINGS) ×1 IMPLANT
BNDG ELASTIC 6X5.8 VLCR NS LF (GAUZE/BANDAGES/DRESSINGS) ×1 IMPLANT
BNDG GAUZE DERMACEA FLUFF 4 (GAUZE/BANDAGES/DRESSINGS) ×2 IMPLANT
BNDG GZE DERMACEA 4 6PLY (GAUZE/BANDAGES/DRESSINGS) ×3
BRUSH SCRUB EZ 4% CHG (MISCELLANEOUS) ×1 IMPLANT
CHLORAPREP W/TINT 26 (MISCELLANEOUS) ×1 IMPLANT
DRAPE INCISE IOBAN 66X45 STRL (DRAPES) IMPLANT
ELECT CAUTERY BLADE 6.4 (BLADE) ×1 IMPLANT
ELECT REM PT RETURN 9FT ADLT (ELECTROSURGICAL) ×1
ELECTRODE REM PT RTRN 9FT ADLT (ELECTROSURGICAL) ×1 IMPLANT
GAUZE XEROFORM 1X8 LF (GAUZE/BANDAGES/DRESSINGS) ×2 IMPLANT
GLOVE BIO SURGEON STRL SZ7 (GLOVE) ×2 IMPLANT
GOWN STRL REUS W/ TWL LRG LVL3 (GOWN DISPOSABLE) ×2 IMPLANT
GOWN STRL REUS W/TWL 2XL LVL3 (GOWN DISPOSABLE) ×1 IMPLANT
GOWN STRL REUS W/TWL LRG LVL3 (GOWN DISPOSABLE) ×2
HANDLE YANKAUER SUCT BULB TIP (MISCELLANEOUS) ×1 IMPLANT
KIT TURNOVER KIT A (KITS) ×1 IMPLANT
LABEL OR SOLS (LABEL) ×1 IMPLANT
MANIFOLD NEPTUNE II (INSTRUMENTS) ×1 IMPLANT
MAT ABSORB FLUID 56X50 GRAY (MISCELLANEOUS) ×1 IMPLANT
NS IRRIG 1000ML POUR BTL (IV SOLUTION) ×1 IMPLANT
PACK EXTREMITY ARMC (MISCELLANEOUS) ×1 IMPLANT
PAD ABD DERMACEA PRESS 5X9 (GAUZE/BANDAGES/DRESSINGS) ×2 IMPLANT
PAD PREP OB/GYN DISP 24X41 (PERSONAL CARE ITEMS) ×1 IMPLANT
SPONGE T-LAP 18X18 ~~LOC~~+RFID (SPONGE) ×1 IMPLANT
STAPLER SKIN PROX 35W (STAPLE) ×1 IMPLANT
STOCKINETTE M/LG 89821 (MISCELLANEOUS) ×1 IMPLANT
SUT SILK 2 0 (SUTURE) ×1
SUT SILK 2 0 SH (SUTURE) ×2 IMPLANT
SUT SILK 2-0 18XBRD TIE 12 (SUTURE) ×1 IMPLANT
SUT SILK 3 0 (SUTURE)
SUT SILK 3-0 18XBRD TIE 12 (SUTURE) ×1 IMPLANT
SUT VIC AB 0 CT1 36 (SUTURE) ×2 IMPLANT
SUT VIC AB 2-0 CT1 (SUTURE) ×2 IMPLANT
TRAP FLUID SMOKE EVACUATOR (MISCELLANEOUS) ×1 IMPLANT
WATER STERILE IRR 500ML POUR (IV SOLUTION) ×1 IMPLANT

## 2023-06-04 NOTE — Op Note (Signed)
OPERATIVE NOTE   PROCEDURE: Right below-the-knee amputation  PRE-OPERATIVE DIAGNOSIS: Right foot gangrene, failed TMA  POST-OPERATIVE DIAGNOSIS: same as above  SURGEON: [name], MD  ASSISTANT(S): Rolla Plate, NP  ANESTHESIA: general  ESTIMATED BLOOD LOSS: 150 cc  FINDING(S): none  SPECIMEN(S):  Right below-the-knee amputation  INDICATIONS:   Allen Chavez is a 67 y.o. male who presents with right leg gangrene.  He has a TMA by podiatry which has failed. The patient is scheduled for a right below-the-knee amputation.  I discussed in depth with the patient the risks, benefits, and alternatives to this procedure.  The patient is aware that the risk of this operation included but are not limited to:  bleeding, infection, myocardial infarction, stroke, death, failure to heal amputation wound, and possible need for more proximal amputation.  The patient is aware of the risks and agrees proceed forward with the procedure. An assistant was present during the procedure to help facilitate the exposure and expedite the procedure.   DESCRIPTION:  After full informed written consent was obtained from the patient, the patient was brought back to the operating room, and placed supine upon the operating table.  Prior to induction, the patient received IV antibiotics. The assistant provided retraction and mobilization to help facilitate exposure and expedite the procedure throughout the entire procedure.  This included following suture, using retractors, and optimizing lighting.  The patient was then prepped and draped in the standard fashion for a below-the-knee amputation.  After obtaining adequate anesthesia, the patient was prepped and draped in the standard fashion for a right below-the-knee amputation.  I marked out the anterior incision two finger breadths below the tibial tuberosity and then the marked out a posterior flap that was one third of the circumference of the calf in length.   I made  the incisions for these flaps, and then dissected through the subcutaneous tissue, fascia, and muscle anteriorly.  I elevated  the periosteal tissue superiorly so that the tibia was about 3-4 cm shorter than the anterior skin flap.  I then transected the tibia with a power saw and then took a wedge off the tibia anteriorly with the power saw.  Then I smoothed out the rough edges.  In a similar fashion, I cut back the fibula about two centimeters higher than the level of the tibia with a bone cutter.  I put a bone hook into the distal tibia and then used a large amputation knife to sharply develop a tissue plane through the muscle along the fibula.  In such fashion, the posterior flap was developed.  At this point, the specimen was passed off the field as the below-the-knee amputation.  At this point, I clamped all visibly bleeding arteries and veins using a combination of suture ligation with Silk suture and electrocautery.  Bleeding continued to be controlled with electrocautery and suture ligature.  The stump was washed off with sterile normal saline and no further active bleeding was noted.  I reapproximated the anterior and posterior fascia  with interrupted stitches of 0 Vicryl.  This was completed along the entire length of anterior and posterior fascia until there were no more loose space in the fascial line. I then placed a layer of 2-0 Vicryl sutures in the subcutaneous tissue. The skin was then  reapproximated with staples.  The stump was washed off and dried.  The incision was dressed with Xeroform and  then fluffs were applied.  Kerlix was wrapped around the leg and then gently  an ACE wrap was applied.    COMPLICATIONS: none  CONDITION: stable   [name]  06/04/2023, 9:43 AM    This note was created with Dragon Medical transcription system. Any errors in dictation are purely unintentional.

## 2023-06-04 NOTE — Transfer of Care (Signed)
Immediate Anesthesia Transfer of Care Note  Patient: Allen Chavez  Procedure(s) Performed: RIGHT AMPUTATION BELOW KNEE (Right: Knee)  Patient Location: PACU  Anesthesia Type:General  Level of Consciousness: awake, alert , and oriented  Airway & Oxygen Therapy: Patient Spontanous Breathing  Post-op Assessment: Report given to RN and Post -op Vital signs reviewed and stable  Post vital signs: Reviewed and stable  Last Vitals:  Vitals Value Taken Time  BP 132/80 06/04/23 0953  Temp    Pulse 100 06/04/23 0955  Resp 15 06/04/23 0955  SpO2 95 % 06/04/23 0955  Vitals shown include unfiled device data.  Last Pain:  Vitals:   06/04/23 0811  TempSrc:   PainSc: 0-No pain         Complications: No notable events documented.

## 2023-06-04 NOTE — Anesthesia Preprocedure Evaluation (Signed)
Anesthesia Evaluation  Patient identified by MRN, date of birth, ID band Patient awake    Reviewed: Allergy & Precautions, H&P , NPO status , Patient's Chart, lab work & pertinent test results, reviewed documented beta blocker date and time   History of Anesthesia Complications Negative for: history of anesthetic complications  Airway Mallampati: II  TM Distance: >3 FB Neck ROM: full    Dental no notable dental hx. (+) Poor Dentition, Missing, Dental Advidsory Given   Pulmonary neg shortness of breath, Continuous Positive Airway Pressure Ventilation neg sleep apnea, neg COPD, neg recent URI, Current Smoker and Patient abstained from smoking.   Pulmonary exam normal breath sounds clear to auscultation       Cardiovascular Exercise Tolerance: Good hypertension, On Medications (-) angina + Peripheral Vascular Disease  (-) Past MI and (-) Cardiac Stents Normal cardiovascular exam(-) dysrhythmias (-) Valvular Problems/Murmurs Rhythm:regular Rate:Normal     Neuro/Psych negative neurological ROS  negative psych ROS   GI/Hepatic Neg liver ROS,GERD  Controlled,,  Endo/Other  negative endocrine ROSdiabetes, Well Controlled, Type 2, Insulin Dependent    Renal/GU ARF and CRFRenal disease  negative genitourinary   Musculoskeletal   Abdominal   Peds  Hematology  (+) Blood dyscrasia, anemia   Anesthesia Other Findings Past Medical History: 10/25/2020: Basal cell carcinoma     Comment:  L neck - ED&C  04/26/2021: BCC (basal cell carcinoma of skin)     Comment:  L distal lat bicep - ED&C 05/22/21 No date: BPH with obstruction/lower urinary tract symptoms No date: Diabetes (HCC) No date: ED (erectile dysfunction) No date: Elevated PSA No date: H/O diabetic retinopathy No date: History of below knee amputation (HCC) No date: History of hepatitis A No date: Hypotestosteronism No date: Lung nodule No date: Prostate cancer  (HCC) No date: Urinary stress incontinence, male   Reproductive/Obstetrics negative OB ROS                             Anesthesia Physical Anesthesia Plan  ASA: 3  Anesthesia Plan: General   Post-op Pain Management: Regional block*   Induction: Intravenous  PONV Risk Score and Plan: 0 and Treatment may vary due to age or medical condition, Ondansetron, Dexamethasone and Midazolam  Airway Management Planned: LMA  Additional Equipment:   Intra-op Plan:   Post-operative Plan: Extubation in OR  Informed Consent: I have reviewed the patients History and Physical, chart, labs and discussed the procedure including the risks, benefits and alternatives for the proposed anesthesia with the patient or authorized representative who has indicated his/her understanding and acceptance.     Dental Advisory Given  Plan Discussed with: Anesthesiologist, CRNA and Surgeon  Anesthesia Plan Comments: (Patient consented for risks of anesthesia including but not limited to:  - adverse reactions to medications - damage to eyes, teeth, lips or other oral mucosa - nerve damage due to positioning  - sore throat or hoarseness - Damage to heart, brain, nerves, lungs, other parts of body or loss of life  Patient voiced understanding and assent.)        Anesthesia Quick Evaluation

## 2023-06-04 NOTE — Progress Notes (Addendum)
Progress Note    Allen Chavez  XBM:[id number] DOB: [birth year]  DOA: 05/28/2023 PCP: [name], MD      Brief Narrative:    Medical records reviewed and are as summarized below:  Allen Chavez is a 67 y.o. male with medical history significant of insulin-dependent diabetes mellitus, hypertension, PAD and CKD stage IIIa presented to ED with generalized malaise and worsening right foot wound with concern of gangrene and surrounding erythema with tracking up to upper legs for about 2 days prior to admission.  Patient also endorsed some subjective fever and chills at home.  He also had poor oral intake.   On presentation  Patient was afebrile, tachycardic at 112 and softer blood pressure 94/52.  Labs pertinent for leukocytosis at 18.1, absolute neutrophil 15.8, hemoglobin 11.5, lactic acid 3.7, sodium 133, CO2 21, glucose 157, BUN 45, creatinine 3.24 with GFR of 20, baseline seems to be around 1.4-1.5.  CXR with no active cardiopulmonary disease. Right foot imaging with asymmetric soft tissue swelling around fifth toe with some probable small amount of air in the soft tissue.  Also noted to have a small cortical breach in the distal phalanx, concerning for acute osteomyelitis.   EKG shows sinus tachycardia without acute ST-T changes   Patient met severe sepsis criteria and blood cultures were drawn.  Received IV fluid and started on broad-spectrum antibiotics.  Vascular surgery and podiatry was consulted.  10/31: Afebrile this morning, maximum temperature recorded of 100.5 overnight.  Lactic acidosis resolved, as some improvement in leukocytosis and hemoglobin decreased to 9.3, all cell line decreased so likely some dilutional effect.  Significantly elevated CRP at 27.3, ESR 81, A1c 7.1, TSH elevated at 12.732, checking thyroid panel.  Preliminary blood cultures negative.  MRI of right foot with concern of osteo of fifth toe, no discrete abscess.  Going for angiography with vascular  today, followed by likely transmetatarsal amputation on Friday or Saturday Hypoglycemia as patient was n.p.o., switching fluids to LR with D5  11/1: S/p angiography and stents placement in right lower extremity.  Transmetatarsal amputation scheduled for tomorrow.  Creatinine slowly improving, at 1.92 today, improving leukocytosis And stable hemoglobin.  Anemia panel consistent with anemia of chronic disease with some iron and B12 deficiency.  B12 at 177.  Ordered IM replacement and also starting on supplement.  11/2: Patient had his right transmetatarsal amputation earlier today, tolerated the procedure well. Thyroid panel with some improvement in TSH to 7, increased T3 uptake but normal T4 and T3. Will need a repeat in 3 to 4 weeks by PCP.  11/3: Vital stable, creatinine continue to slowly improve, at 1.67 today.  Persistent leukocytosis likely some reactive aliment as patient had his TMA yesterday.  Preliminary wound culture with no organism but patient was already on antibiotics.  Pending PT/OT evaluation  11/4: Podiatry is concerned about a spot at his stump, will be high risk for BKA due to decreased blood supply.  PT is recommending SNF.  Cultures are growing group C strep, antibiotics switched with Unasyn.  11/5: Patient with failing TMA flap, vascular surgery was reconsulted and patient will be going for BKA on right, likely tomorrow depending OR availability.      Assessment/Plan:   Principal Problem:   Severe sepsis (HCC) Active Problems:   AKI (acute kidney injury) (HCC)   Peripheral arterial disease (HCC)   Type 2 diabetes mellitus (HCC)   Essential (primary) hypertension   Hyperlipidemia   GERD (gastroesophageal reflux disease)  Malignant neoplasm of prostate (HCC)   Gangrene of right foot (HCC)   Necrotizing subcutaneous infection (HCC)   Elevated TSH   Normocytic anemia   Nutrition Problem: Increased nutrient needs Etiology: post-op healing  Signs/Symptoms:  estimated needs   Body mass index is 22.47 kg/m.   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  S/p right foot transmetatarsal amputation on 05/31/2023.  Right foot deep wound cultures from 05/31/2023 showed rare Streptococcus group G.  No growth on blood cultures. Persistent leukocytosis.  Continue IV Unasyn for now. Add IV Dilaudid for adequate pain control.  Continue oxycodone. Lactic acid was 3.7 on admission.  He was treated with IV fluids and IV antibiotics.   Peripheral vascular disease: S/p percutaneous transluminal angioplasty and stent placement to right SFA and popliteal arteries on 05/29/2023 History of left BKA many years ago   AKI on CKD stage IIIa : Improving.  Creatinine is trending down.  Continue to monitor. Creatinine was 3.24 on admission.   Hypertension: Lisinopril on hold because of AKI    Type II DM with hyperglycemia: Continue insulin glargine 20 units twice daily.  NovoLog as needed per sliding scale. He was taking Lantus 60 units daily and NovoLog 2 to 10 units with meals at home. Hemoglobin A1c 7.7 in September 2024   Normocytic anemia: H&H is stable. Vitamin B12 deficiency (vitamin B12 177): Continue vitamin B12 supplement. Ferritin 510, iron 15, iron saturation ratio 7, TIBC 218.   Elevated TSH: Initial TSH was 12.732 on 05/28/2023.  Repeat TSH was 7.430 on 05/29/2023.  Free T4 was normal.  This could be subclinical hypothyroidism.  Repeat thyroid function tests as an outpatient in 4 to 6 weeks    Other comorbidities include hyperlipidemia, GERD  Diet Order             Diet regular Room service appropriate? Yes; Fluid consistency: Thin  Diet effective now                            Consultants: Vascular surgeon Podiatrist  Procedures: Right BKA on 06/04/2023 Right foot transmetatarsal amputation 05/31/2023 Percutaneous transluminal angioplasty and stent placement to right lower extremity popliteal  and right SFA arteries    Medications:    vitamin C  500 mg Oral BID   aspirin EC  81 mg Oral Daily   atorvastatin  40 mg Oral Daily   clopidogrel  75 mg Oral Daily   Fe Fum-Vit C-Vit B12-FA  1 capsule Oral BID   fenofibrate  160 mg Oral Daily   heparin  5,000 Units Subcutaneous Q8H   insulin aspart  0-15 Units Subcutaneous TID WC   insulin aspart  0-5 Units Subcutaneous QHS   insulin aspart  5 Units Subcutaneous TID WC   insulin glargine-yfgn  20 Units Subcutaneous BID   lactulose  30 g Oral BID   melatonin  5 mg Oral QHS   multivitamin with minerals  1 tablet Oral Daily   nutrition supplement (JUVEN)  1 packet Oral BID BM   pantoprazole  40 mg Oral Daily   polyethylene glycol  17 g Oral BID   sodium chloride flush  3 mL Intravenous Q12H   zinc sulfate  220 mg Oral Daily   Continuous Infusions:  ampicillin-sulbactam (UNASYN) IV 3 g (06/04/23 0531)     Anti-infectives (From admission, onward)    Start     Dose/Rate Route Frequency Ordered Stop  06/02/23 1800  Ampicillin-Sulbactam (UNASYN) 3 g in sodium chloride 0.9 % 100 mL IVPB        3 g 200 mL/hr over 30 Minutes Intravenous Every 6 hours 06/02/23 1408     06/01/23 1200  vancomycin (VANCOREADY) IVPB 1250 mg/250 mL  Status:  Discontinued        1,250 mg 166.7 mL/hr over 90 Minutes Intravenous Every 24 hours 06/01/23 0918 06/02/23 1408   05/31/23 1345  vancomycin (VANCOREADY) IVPB 1250 mg/250 mL        1,250 mg 166.7 mL/hr over 90 Minutes Intravenous  Once 05/31/23 1254 05/31/23 1434   05/30/23 0900  vancomycin (VANCOREADY) IVPB 1250 mg/250 mL        1,250 mg 166.7 mL/hr over 90 Minutes Intravenous  Once 05/30/23 0811 05/30/23 1135   05/30/23 0815  ceFEPIme (MAXIPIME) 2 g in sodium chloride 0.9 % 100 mL IVPB  Status:  Discontinued        2 g 200 mL/hr over 30 Minutes Intravenous Every 12 hours 05/30/23 0812 06/02/23 1408   05/29/23 1600  ceFEPIme (MAXIPIME) 2 g in sodium chloride 0.9 % 100 mL IVPB  Status:   Discontinued        2 g 200 mL/hr over 30 Minutes Intravenous Every 24 hours 05/28/23 1801 05/30/23 0812   05/29/23 1556  ceFAZolin (ANCEF) IVPB 2g/100 mL premix  Status:  Discontinued        2 g 200 mL/hr over 30 Minutes Intravenous 30 min pre-op 05/29/23 1556 05/29/23 1747   05/29/23 0800  vancomycin variable dose per unstable renal function (pharmacist dosing)  Status:  Discontinued         Does not apply See admin instructions 05/28/23 1808 06/01/23 0918   05/29/23 0600  metroNIDAZOLE (FLAGYL) IVPB 500 mg  Status:  Discontinued        500 mg 100 mL/hr over 60 Minutes Intravenous Every 12 hours 05/28/23 1746 06/02/23 1408   05/28/23 1900  vancomycin (VANCOREADY) IVPB 750 mg/150 mL        750 mg 150 mL/hr over 60 Minutes Intravenous  Once 05/28/23 1802 05/28/23 2139   05/28/23 1615  ceFEPIme (MAXIPIME) 2 g in sodium chloride 0.9 % 100 mL IVPB        2 g 200 mL/hr over 30 Minutes Intravenous  Once 05/28/23 1609 05/28/23 1715   05/28/23 1615  metroNIDAZOLE (FLAGYL) IVPB 500 mg        500 mg 100 mL/hr over 60 Minutes Intravenous  Once 05/28/23 1609 05/28/23 1829   05/28/23 1615  vancomycin (VANCOCIN) IVPB 1000 mg/200 mL premix        1,000 mg 200 mL/hr over 60 Minutes Intravenous  Once 05/28/23 1609 05/28/23 2026              Family Communication/Anticipated D/C date and plan/Code Status   DVT prophylaxis: heparin injection 5,000 Units Start: 05/28/23 1730     Code Status: Full Code  Family Communication: Plan discussed with Dewayne Hatch, daughter, at the bedside Disposition Plan: Plan to discharge to SNF   Status is: Inpatient Remains inpatient appropriate because: S/p right BKA       Subjective:   Interval events noted.  He complains of severe pain at right BKA stump wound.  Ann, daughter, was at the bedside.  Deliliah, case manager and Altha Harm, LPN,  were at the bedside.  Objective:    Vitals:   06/04/23 1030 06/04/23 1032 06/04/23 1045 06/04/23 1111  BP: 116/82  119/69 119/82  Pulse: (!) 103 (!) 102 (!) 107 (!) 103  Resp: 14 12 12 17   Temp:   (!) 97.5 F (36.4 C)   TempSrc:      SpO2: 92% 94% 93% 95%  Weight:      Height:       No data found.   Intake/Output Summary (Last 24 hours) at 06/04/2023 1122 Last data filed at 06/04/2023 0943 Gross per 24 hour  Intake 800 ml  Output 600 ml  Net 200 ml   Filed Weights   05/31/23 0500 06/02/23 0500 06/03/23 0500  Weight: 81.4 kg 84.4 kg 79.4 kg    Exam:   GEN: NAD SKIN: No rash EYES: EOMI ENT: MMM CV: RRR PULM: CTA B ABD: soft, ND, NT, +BS CNS: AAO x 3, non focal EXT: Right BKA.  Dressing on right BKA stump wound is clean, dry and intact.  Old left BKA       Data Reviewed:   I have personally reviewed following labs and imaging studies:  Labs: Labs show the following:   Basic Metabolic Panel: Recent Labs  Lab 05/29/23 0558 05/30/23 0321 05/31/23 1214 06/01/23 0354 06/03/23 0529 06/04/23 0514  NA 134* 135 136 139  --  140  K 3.6 3.6 3.9 3.9  --  3.5  CL 104 104 108 111  --  110  CO2 21* 22 21* 21*  --  23  GLUCOSE 65* 116* 89 148*  --  81  BUN 39* 33* 39* 39*  --  30*  CREATININE 2.25* 1.92* 1.90* 1.67* 1.51* 1.37*  CALCIUM 8.6* 8.1* 7.8* 8.1*  --  8.4*  PHOS  --  2.0*  --   --   --   --    GFR Estimated Creatinine Clearance: 58 mL/min (A) (by C-G formula based on SCr of 1.37 mg/dL (H)). Liver Function Tests: Recent Labs  Lab 05/28/23 1535 05/30/23 0321  AST 22  --   ALT 14  --   ALKPHOS 71  --   BILITOT 1.0  --   PROT 8.1  --   ALBUMIN 3.6 2.5*   No results for input(s): "LIPASE", "AMYLASE" in the last 168 hours. No results for input(s): "AMMONIA" in the last 168 hours. Coagulation profile Recent Labs  Lab 05/28/23 1535  INR 1.3*    CBC: Recent Labs  Lab 05/28/23 1535 05/29/23 0558 05/30/23 0321 05/31/23 1214 06/01/23 0354 06/04/23 0514  WBC 18.1* 14.2* 11.9* 16.2* 16.6* 16.7*  NEUTROABS 15.8*  --   --   --   --   --   HGB 11.5* 9.3*  9.3* 8.4* 8.5* 10.6*  HCT 34.2* 26.8* 26.5* 24.2* 24.5* 30.7*  MCV 82.4 80.0 82.3 81.2 80.9 80.6  PLT 302 276 273 265 303 465*   Cardiac Enzymes: No results for input(s): "CKTOTAL", "CKMB", "CKMBINDEX", "TROPONINI" in the last 168 hours. BNP (last 3 results) No results for input(s): "PROBNP" in the last 8760 hours. CBG: Recent Labs  Lab 06/03/23 1124 06/03/23 1650 06/03/23 2134 06/04/23 0743 06/04/23 0958  GLUCAP 205* 140* 110* 91 145*   D-Dimer: No results for input(s): "DDIMER" in the last 72 hours. Hgb A1c: No results for input(s): "HGBA1C" in the last 72 hours. Lipid Profile: No results for input(s): "CHOL", "HDL", "LDLCALC", "TRIG", "CHOLHDL", "LDLDIRECT" in the last 72 hours. Thyroid function studies: No results for input(s): "TSH", "T4TOTAL", "T3FREE", "THYROIDAB" in the last 72 hours.  Invalid input(s): "FREET3" Anemia work up: No results for input(s): "  VITAMINB12", "FOLATE", "FERRITIN", "TIBC", "IRON", "RETICCTPCT" in the last 72 hours. Sepsis Labs: Recent Labs  Lab 05/28/23 1540 05/28/23 1925 05/29/23 0558 05/30/23 0321 05/31/23 1214 06/01/23 0354 06/04/23 0514  WBC  --   --    < > 11.9* 16.2* 16.6* 16.7*  LATICACIDVEN 3.7* 1.6  --   --   --   --   --    < > = values in this interval not displayed.    Microbiology Recent Results (from the past 240 hour(s))  Culture, blood (Routine x 2)     Status: None   Collection Time: 05/28/23  3:40 PM   Specimen: BLOOD  Result Value Ref Range Status   Specimen Description BLOOD BLOOD RIGHT ARM  Final   Special Requests   Final    BOTTLES DRAWN AEROBIC AND ANAEROBIC Blood Culture results may not be optimal due to an excessive volume of blood received in culture bottles   Culture   Final    NO GROWTH 5 DAYS Performed at [facility], [address], [place], [place] [address]    Report Status 06/02/2023 FINAL  Final  Culture, blood (Routine x 2)     Status: None   Collection Time: 05/28/23  4:30 PM    Specimen: BLOOD  Result Value Ref Range Status   Specimen Description BLOOD LEFT ANTECUBITAL  Final   Special Requests   Final    BOTTLES DRAWN AEROBIC AND ANAEROBIC Blood Culture adequate volume   Culture   Final    NO GROWTH 5 DAYS Performed at [facility], [address], [place], [place] [address]    Report Status 06/02/2023 FINAL  Final  Surgical PCR screen     Status: None   Collection Time: 05/31/23 12:00 AM   Specimen: Nasal Mucosa; Nasal Swab  Result Value Ref Range Status   MRSA, PCR NEGATIVE NEGATIVE Final   Staphylococcus aureus NEGATIVE NEGATIVE Final    Comment: (NOTE) The Xpert SA Assay (FDA approved for NASAL specimens in patients 34 years of age and older), is one component of a comprehensive surveillance program. It is not intended to diagnose infection nor to guide or monitor treatment. Performed at [facility], [address] [place], [place] [address]   Aerobic/Anaerobic Culture w Gram Stain (surgical/deep wound)     Status: None (Preliminary result)   Collection Time: 05/31/23  9:07 AM   Specimen: Wound; Tissue  Result Value Ref Range Status   Specimen Description   Final    WOUND Performed at [facility], [address], [place], [place] [address]    Special Requests   Final    NONE Performed at [facility], [address] [place], [place] [address]    Gram Stain   Final    NO WBC SEEN NO ORGANISMS SEEN Performed at [facility], [address], [place], [place] [address]    Culture   Final    RARE STREPTOCOCCUS GROUP G Beta hemolytic streptococci are predictably susceptible to penicillin and other beta lactams. Susceptibility testing not routinely performed. NO ANAEROBES ISOLATED; CULTURE IN PROGRESS FOR 5 DAYS    Report Status PENDING  Incomplete  Aerobic/Anaerobic Culture w Gram Stain (surgical/deep wound)     Status: None (Preliminary result)   Collection Time: 05/31/23  9:12 AM   Specimen:  Wound; Tissue  Result Value Ref Range Status   Specimen Description   Final    WOUND Performed at [facility],  [address] [place], [place] [address]    Special Requests   Final    NONE Performed at [facility], [address] [place], [place] [address]    Gram Stain   Final    NO WBC SEEN NO ORGANISMS SEEN Performed at [facility], [address] [place], [place] [address]    Culture   Final    RARE STREPTOCOCCUS GROUP G Beta hemolytic streptococci are predictably susceptible to penicillin and other beta lactams. Susceptibility testing not routinely performed. NO ANAEROBES ISOLATED; CULTURE IN PROGRESS FOR 5 DAYS    Report Status PENDING  Incomplete    Procedures and diagnostic studies:  No results found.             LOS: 7 days   Everlina Gotts  [facility]   Pager on [email or web address]. If 7PM-7AM, please contact night-coverage at [email or web address]     06/04/2023, 11:22 AM

## 2023-06-04 NOTE — Progress Notes (Signed)
Initial Nutrition Assessment  DOCUMENTATION CODES:   Not applicable  INTERVENTION:   -D/c Ensure Enlive po BID, each supplement provides 350 kcal and 20 grams of protein -MVI with minerals daily -1 packet Juven BID, each packet provides 95 calories, 2.5 grams of protein (collagen), and 9.8 grams of carbohydrate (3 grams sugar); also contains 7 grams of L-arginine and L-glutamine, 300 mg vitamin C, 15 mg vitamin E, 1.2 mcg vitamin B-12, 9.5 mg zinc, 200 mg calcium, and 1.5 g  Calcium Beta-hydroxy-Beta-methylbutyrate to support wound healing  -500 mg vitamin C BID -220 mg zinc sulfate daily -Double protein portions with meals  NUTRITION DIAGNOSIS:   Increased nutrient needs related to post-op healing as evidenced by estimated needs.  GOAL:   Patient will meet greater than or equal to 90% of their needs  MONITOR:   PO intake, Supplement acceptance  REASON FOR ASSESSMENT:   Malnutrition Screening Tool    ASSESSMENT:   Pt with medical history significant of insulin-dependent diabetes mellitus, hypertension, PAD and CKD stage IIIa presented with generalized malaise and worsening right foot wound with concern of gangrene and surrounding erythema with tracking up to upper legs for the past 2 days PTA.  Pt admitted with severe sepsis secondary to rt gangrenous foot wound.   10/31- aortogram 11/2- s/p Right foot transmetatarsal amputation  11/6- s/p Right below-the-knee amputation   Reviewed I/O's: -450 ml x 24 hours and +2 L since admission  UOP: 450 ml x 24 hours  Pt unavailable at time of visit. Pt down in OR at time of visit. RD unable to obtain further nutrition-related history or complete nutrition-focused physical exam at this time.     Per H&P, pt s/p lt BKA may years ago.   Pt currently on a carb modified diet. No meal completion data available to assess at thsi time.   Reviewed wt hx; wt has been stable over the past 7 months.   Pt with increased nutritional  needs for post-op healing and would benefit from addition of oral nutrition supplements.   Medications reviewed and include fenoibrate, melatonin, and miralax.   Lab Results  Component Value Date   HGBA1C 7.1 (H) 05/28/2023   PTA DM medications are 60 units inssulin glargine daily and 2-10 units insulin aspart TID with meals.   Labs reviewed: CBGS: 91-205 (inpatient orders for glycemic control are 0-15 units insulin aspart TID with meals, 0-5 units insulin aspart daily at bedtime, 5 units insulin aspart TID with meal,s and 20 units insulin glargine-yfgn daily).    Diet Order:   Diet Order             Diet Carb Modified Fluid consistency: Thin; Room service appropriate? Yes  Diet effective now                   EDUCATION NEEDS:   No education needs have been identified at this time  Skin:  Skin Assessment: Skin Integrity Issues: Skin Integrity Issues:: Incisions Incisions: s/p rt BKA  Last BM:  06/03/23 (type 7)  Height:   Ht Readings from Last 1 Encounters:  05/28/23 6\' 2"  (1.88 m)    Weight:   Wt Readings from Last 1 Encounters:  06/03/23 79.4 kg    Ideal Body Weight:  75.6 kg (adjusted for bilteral BKAs)  BMI:  Body mass index is 22.47 kg/m.  Estimated Nutritional Needs:   Kcal:  2200-2400  Protein:  120-135 grams  Fluid:  > 2 L    Allen Chavez  W, RD, LDN, CDCES Registered Dietitian III Certified Diabetes Care and Education Specialist Please refer to [facility] for RD and/or RD on-call/weekend/after hours pager

## 2023-06-04 NOTE — Progress Notes (Signed)
OT Cancellation Note  Patient Details Name: Shivam Mestas MRN: [id number] DOB: [birth year]   Cancelled Treatment:    Reason Eval/Treat Not Completed: Patient at procedure or test/ unavailable (Pt. is having surgery for a RBKA. Will plan to intervene when appropriate, and new orders are in place.)  Olegario Messier, MS, OTR/L 06/04/2023, 8:57 AM

## 2023-06-04 NOTE — Anesthesia Procedure Notes (Signed)
Procedure Name: LMA Insertion Date/Time: 06/04/2023 8:41 AM  Performed by: Karoline Caldwell, CRNAPre-anesthesia Checklist: Patient identified, Patient being monitored, Timeout performed, Emergency Drugs available and Suction available Patient Re-evaluated:Patient Re-evaluated prior to induction Oxygen Delivery Method: Circle system utilized Preoxygenation: Pre-oxygenation with 100% oxygen Induction Type: IV induction Ventilation: Mask ventilation without difficulty LMA: LMA inserted LMA Size: 4.0 Tube type: Oral Number of attempts: 1 Placement Confirmation: positive ETCO2 and breath sounds checked- equal and bilateral Tube secured with: Tape Dental Injury: Teeth and Oropharynx as per pre-operative assessment

## 2023-06-04 NOTE — Interval H&P Note (Signed)
History and Physical Interval Note:  06/04/2023 8:13 AM  Allen Chavez  has presented today for surgery, with the diagnosis of Gangrene right foot.  The various methods of treatment have been discussed with the patient and family. After consideration of risks, benefits and other options for treatment, the patient has consented to  Procedure(s): RIGHT AMPUTATION BELOW KNEE (Right) as a surgical intervention.  [name] history has been reviewed, patient examined, no change in status, stable for surgery.  I have reviewed the patient's chart and labs.  Questions were answered to the patient's satisfaction.     [name]

## 2023-06-04 NOTE — OR Nursing (Signed)
Surgery schedule changed, Dr. Wyn Quaker requested this patient to be moved up. Pt agreed. Report received and SDS notified. Pt has been NPO since MN

## 2023-06-04 NOTE — Plan of Care (Signed)
  Problem: Education: Goal: Knowledge of General Education information will improve Description: Including pain rating scale, medication(s)/side effects and non-pharmacologic comfort measures Outcome: Progressing   Problem: Coping: Goal: Level of anxiety will decrease Outcome: Progressing   Problem: Pain Management: Goal: General experience of comfort will improve Outcome: Progressing   Problem: Safety: Goal: Ability to remain free from injury will improve Outcome: Progressing

## 2023-06-04 NOTE — TOC Progression Note (Signed)
Transition of Care [facility]) - Progression Note    Patient Details  Name: Allen Chavez MRN: [id number] Date of Birth: 1955-10-21  [facility] of [facility]) CM/SW Contact  Marlowe Sax, RN Phone Number: 06/04/2023, 12:39 PM  Clinical Narrative:    Met the patient and his daughter Allen Chavez in the room I explained that [facility] does not have a bed, he has 2 options,m they will discuss and let me know which one   Expected Discharge Plan: Skilled Nursing Facility Barriers to Discharge: SNF Pending bed offer, Insurance Authorization  Expected Discharge Plan and Services In-house Referral: Clinical Social Work Discharge Planning Services: Other - See comment (Pending)   Living arrangements for the past 2 months: Single Family Home                 DME Arranged: N/A DME Agency: NA       HH Arranged: NA (Pending) HH Agency: NA (Pending)         Social Determinants of Health (SDOH) Interventions SDOH Screenings   Food Insecurity: No Food Insecurity (05/28/2023)  Housing: Low Risk  (05/28/2023)  Transportation Needs: No Transportation Needs (05/28/2023)  Utilities: Not At Risk (05/28/2023)  Financial Resource Strain: Patient Declined (09/30/2022)   Received from [facility], [facility]  Tobacco Use: High Risk (06/04/2023)    Readmission Risk Interventions     No data to display

## 2023-06-05 DIAGNOSIS — D62 Acute posthemorrhagic anemia: Secondary | ICD-10-CM

## 2023-06-05 DIAGNOSIS — R6520 Severe sepsis without septic shock: Secondary | ICD-10-CM | POA: Diagnosis not present

## 2023-06-05 DIAGNOSIS — A419 Sepsis, unspecified organism: Secondary | ICD-10-CM | POA: Diagnosis not present

## 2023-06-05 LAB — CBC
HCT: 20.9 % — ABNORMAL LOW (ref 39.0–52.0)
Hemoglobin: 7.2 g/dL — ABNORMAL LOW (ref 13.0–17.0)
MCH: 27.6 pg (ref 26.0–34.0)
MCHC: 34.4 g/dL (ref 30.0–36.0)
MCV: 80.1 fL (ref 80.0–100.0)
Platelets: 441 10*3/uL — ABNORMAL HIGH (ref 150–400)
RBC: 2.61 MIL/uL — ABNORMAL LOW (ref 4.22–5.81)
RDW: 16.7 % — ABNORMAL HIGH (ref 11.5–15.5)
WBC: 11.8 10*3/uL — ABNORMAL HIGH (ref 4.0–10.5)
nRBC: 0 % (ref 0.0–0.2)

## 2023-06-05 LAB — ABO/RH: ABO/RH(D): B POS

## 2023-06-05 LAB — GLUCOSE, CAPILLARY
Glucose-Capillary: 240 mg/dL — ABNORMAL HIGH (ref 70–99)
Glucose-Capillary: 194 mg/dL — ABNORMAL HIGH (ref 70–99)
Glucose-Capillary: 197 mg/dL — ABNORMAL HIGH (ref 70–99)
Glucose-Capillary: 186 mg/dL — ABNORMAL HIGH (ref 70–99)
Glucose-Capillary: 108 mg/dL — ABNORMAL HIGH (ref 70–99)

## 2023-06-05 LAB — AEROBIC/ANAEROBIC CULTURE W GRAM STAIN (SURGICAL/DEEP WOUND)
Gram Stain: NONE SEEN
Gram Stain: NONE SEEN

## 2023-06-05 LAB — PREPARE RBC (CROSSMATCH)

## 2023-06-05 LAB — BASIC METABOLIC PANEL
Anion gap: 6 (ref 5–15)
BUN: 31 mg/dL — ABNORMAL HIGH (ref 8–23)
CO2: 22 mmol/L (ref 22–32)
Calcium: 7.6 mg/dL — ABNORMAL LOW (ref 8.9–10.3)
Chloride: 107 mmol/L (ref 98–111)
Creatinine, Ser: 1.27 mg/dL — ABNORMAL HIGH (ref 0.61–1.24)
GFR, Estimated: >60 mL/min (ref >60)
Glucose, Bld: 259 mg/dL — ABNORMAL HIGH (ref 70–99)
Potassium: 4.1 mmol/L (ref 3.5–5.1)
Sodium: 135 mmol/L (ref 135–145)

## 2023-06-05 MED ORDER — SODIUM CHLORIDE 0.9% IV SOLUTION: Freq: Once | INTRAVENOUS | Status: DC

## 2023-06-05 NOTE — Evaluation (Signed)
Physical Therapy Evaluation Patient Details Name: Axel Frisk MRN: [id number] DOB: 03-16-1956 Today's Date: 06/05/2023  History of Present Illness  Lyn Deemer is a 67 y.o. male with medical history significant of insulin-dependent diabetes mellitus, hypertension, PAD and CKD stage IIIa presented to ED with generalized malaise and worsening right foot wound with concern of gangrene and surrounding erythema with tracking up to upper legs for the past 2 days.  Patient also endorsed some subjective fever and chills at home.  He was having generalized malaise and poor p.o. intake for the past couple of days. Pt is now R BKA.   Clinical Impression  Patient received in bed, son at bedside. Patient is agreeable to PT assessment. He reports pain and states he is to have blood transfusion. Patient requires supervision for bed mobility. Cues for safety. He did not attempt to scoot transfer to chair this session, but was able to sit edge of bed and scoot backward up to head of bed in sitting. Patient will continue to benefit from skilled PT to improve functional independence.          If plan is discharge home, recommend the following: A little help with bathing/dressing/bathroom;A little help with walking and/or transfers;Assist for transportation;Help with stairs or ramp for entrance   Can travel by private vehicle   No    Equipment Recommendations Other (comment) (TBD next venue)TBD  Recommendations for Other Services       Functional Status Assessment Patient has had a recent decline in their functional status and demonstrates the ability to make significant improvements in function in a reasonable and predictable amount of time.     Precautions / Restrictions Precautions Precautions: Fall Restrictions Weight Bearing Restrictions: Yes RLE Weight Bearing: Non weight bearing LLE Weight Bearing: Non weight bearing Other Position/Activity Restrictions: L skin tear cannot don prosthesis       Mobility  Bed Mobility Overal bed mobility: Needs Assistance Bed Mobility: Supine to Sit, Sit to Supine     Supine to sit: Supervision, HOB elevated, Used rails Sit to supine: Supervision, Used rails   General bed mobility comments: cues for safety    Transfers                   General transfer comment: Not assessed this session. Patient getting ready for blood transfusion    Ambulation/Gait               General Gait Details: unable  Stairs            Wheelchair Mobility     Tilt Bed    Modified Rankin (Stroke Patients Only)       Balance Overall balance assessment: Needs assistance Sitting-balance support: Bilateral upper extremity supported Sitting balance-Leahy Scale: Fair Sitting balance - Comments: unsteady at times, B UE support sitting EOB Postural control: Posterior lean                                   Pertinent Vitals/Pain Pain Assessment Pain Assessment: 0-10 Pain Score: 6  Pain Location: R stump Pain Descriptors / Indicators: Sore Pain Intervention(s): Monitored during session, Premedicated before session    Home Living Family/patient expects to be discharged to:: Skilled nursing facility Living Arrangements: Alone Available Help at Discharge: Family;Available PRN/intermittently Type of Home: Mobile home Home Access: Stairs to enter       Home Layout: One level Home Equipment: None  Prior Function Prior Level of Function : Independent/Modified Independent             Mobility Comments: Independent ambulator. ADLs Comments: IND, including driving and IADLs     Extremity/Trunk Assessment   Upper Extremity Assessment Upper Extremity Assessment: Defer to OT evaluation    Lower Extremity Assessment Lower Extremity Assessment: LLE deficits/detail;RLE deficits/detail RLE Deficits / Details: NWB to RLE RLE: Unable to fully assess due to pain RLE Sensation: WNL RLE Coordination: WNL     Cervical / Trunk Assessment Cervical / Trunk Assessment: Normal  Communication   Communication Communication: No apparent difficulties Cueing Techniques: Verbal cues;Gestural cues  Cognition Arousal: Alert Behavior During Therapy: WFL for tasks assessed/performed Overall Cognitive Status: Within Functional Limits for tasks assessed                                 General Comments: grossly WFL, VC to redirect to questions/tasks at times        General Comments      Exercises Amputee Exercises Quad Sets: AAROM, Right, 5 reps Hip ABduction/ADduction: AROM, Right, 5 reps Hip Flexion/Marching: AROM, Both, 10 reps Knee Extension: AROM, Right, 5 reps Straight Leg Raises: AROM, Both, 10 reps   Assessment/Plan    PT Assessment Patient needs continued PT services  PT Problem List Decreased strength;Decreased range of motion;Decreased activity tolerance;Decreased balance;Decreased mobility;Decreased safety awareness;Decreased knowledge of use of DME;Pain;Impaired sensation       PT Treatment Interventions Functional mobility training;Therapeutic activities;Therapeutic exercise;Balance training;Neuromuscular re-education;Patient/family education    PT Goals (Current goals can be found in the Care Plan section)  Acute Rehab PT Goals Patient Stated Goal: To go to rehab, then go back up to [place] PT Goal Formulation: With patient/family Time For Goal Achievement: 06/19/23 Potential to Achieve Goals: Good    Frequency Min 1X/week     Co-evaluation PT/OT/SLP Co-Evaluation/Treatment: Yes Reason for Co-Treatment: For patient/therapist safety;To address functional/ADL transfers PT goals addressed during session: Mobility/safety with mobility;Balance OT goals addressed during session: ADL's and self-care       AM-PAC PT "6 Clicks" Mobility  Outcome Measure Help needed turning from your back to your side while in a flat bed without using bedrails?: A  Little Help needed moving from lying on your back to sitting on the side of a flat bed without using bedrails?: A Little Help needed moving to and from a bed to a chair (including a wheelchair)?: A Lot Help needed standing up from a chair using your arms (e.g., wheelchair or bedside chair)?: Total Help needed to walk in hospital room?: Total Help needed climbing 3-5 steps with a railing? : Total 6 Click Score: 11    End of Session   Activity Tolerance: Patient tolerated treatment well;Patient limited by pain Patient left: in bed;with call bell/phone within reach;with family/visitor present Nurse Communication: Mobility status PT Visit Diagnosis: Other abnormalities of gait and mobility (R26.89);Pain;Muscle weakness (generalized) (M62.81) Pain - Right/Left: Right Pain - part of body: Leg    Time: 0939-1001 PT Time Calculation (min) (ACUTE ONLY): 22 min   Charges:   PT Evaluation $PT Eval Moderate Complexity: 1 Mod   PT General Charges $$ ACUTE PT VISIT: 1 Visit         Tori Dattilio, PT, GCS 06/05/23,10:21 AM

## 2023-06-05 NOTE — Progress Notes (Signed)
Occupational Therapy Treatment Patient Details Name: Allen Chavez MRN: [id number] DOB: 12-Aug-1955 Today's Date: 06/05/2023   History of present illness Allen Chavez is a 67 y.o. male with medical history significant of insulin-dependent diabetes mellitus, hypertension, PAD and CKD stage IIIa presented to ED with generalized malaise and worsening right foot wound with concern of gangrene and surrounding erythema with tracking up to upper legs for the past 2 days.  Patient also endorsed some subjective fever and chills at home.  He was having generalized malaise and poor p.o. intake for the past couple of days. Pt is now R BKA.   OT comments  Pt seen for OT tx with PT co-tx to optimize safety with ADL mobility now that pt is s/p R BKA. Pt tolerated session well despite 6/10 residual limb pain. Able to complete bed mobility and lateral scoots with supervision and VC for safety as pt demos more impaired balance. Pt was able to complete grooming tasks EOB with no UE support on the bed when cued but generally kept UE on side of bed otherwise. Pt educated in R knee positioning and importance of knee extension for future prosthesis. Encouraged to sit EOB for meals to promote mobility. Pt/son verbalized understanding. Pt continues to benefit from skilled OT services. OT goals reviewed and remain appropriate.       If plan is discharge home, recommend the following:  A lot of help with walking and/or transfers;Direct supervision/assist for medications management;Direct supervision/assist for financial management;Assist for transportation;Help with stairs or ramp for entrance;A little help with bathing/dressing/bathroom   Equipment Recommendations  Other (comment) (defer, will likely need w/c, slide board, drop arm BSC)    Recommendations for Other Services      Precautions / Restrictions Precautions Precautions: Fall Restrictions Weight Bearing Restrictions: Yes RLE Weight Bearing: Non weight bearing LLE  Weight Bearing: Non weight bearing Other Position/Activity Restrictions: L skin tear cannot don prosthesis       Mobility Bed Mobility Overal bed mobility: Needs Assistance Bed Mobility: Supine to Sit, Sit to Supine     Supine to sit: Supervision Sit to supine: Supervision        Transfers Overall transfer level: Needs assistance Equipment used: None Transfers: Bed to chair/wheelchair/BSC            Lateral/Scoot Transfers: Supervision General transfer comment: supv for safety/balance     Balance Overall balance assessment: Needs assistance Sitting-balance support: No upper extremity supported, Feet unsupported Sitting balance-Leahy Scale: Fair                                     ADL either performed or assessed with clinical judgement   ADL Overall ADL's : Needs assistance/impaired     Grooming: Sitting;Wash/dry face;Supervision/safety Grooming Details (indicate cue type and reason): able to maintain fair static sitting balance wihtout UE support on EOB to complete                                    Extremity/Trunk Assessment Upper Extremity Assessment Upper Extremity Assessment: Defer to OT evaluation   Lower Extremity Assessment Lower Extremity Assessment: LLE deficits/detail;RLE deficits/detail RLE Deficits / Details: NWB to RLE RLE: Unable to fully assess due to pain RLE Sensation: WNL RLE Coordination: WNL   Cervical / Trunk Assessment Cervical / Trunk Assessment: Normal    Vision  Perception     Praxis      Cognition Arousal: Alert Behavior During Therapy: WFL for tasks assessed/performed Overall Cognitive Status: Within Functional Limits for tasks assessed                                 General Comments: grossly WFL, VC to redirect to questions/tasks at times        Exercises Other Exercises Other Exercises: Pt educated in R knee positioning and importance of knee extension for  future prosthesis. Encouraged to sit EOB for meals to promote mobility    Shoulder Instructions       General Comments      Pertinent Vitals/ Pain       Pain Assessment Pain Assessment: 0-10 Pain Score: 6  Pain Location: R stump Pain Descriptors / Indicators: Aching Pain Intervention(s): Limited activity within patient's tolerance, Monitored during session, Premedicated before session, Repositioned  Home Living Family/patient expects to be discharged to:: Skilled nursing facility Living Arrangements: Alone Available Help at Discharge: Family;Available PRN/intermittently Type of Home: Mobile home Home Access: Stairs to enter     Home Layout: One level     Bathroom Shower/Tub: Chief Strategy Officer: Standard     Home Equipment: None          Prior Functioning/Environment              Frequency  Min 1X/week        Progress Toward Goals  OT Goals(current goals can now be found in the care plan section)  Progress towards OT goals: Progressing toward goals  Acute Rehab OT Goals Patient Stated Goal: go to rehab and try to find housing OT Goal Formulation: With patient/family Time For Goal Achievement: 06/19/23 Potential to Achieve Goals: Good  Plan      Co-evaluation    PT/OT/SLP Co-Evaluation/Treatment: Yes Reason for Co-Treatment: For patient/therapist safety;To address functional/ADL transfers PT goals addressed during session: Mobility/safety with mobility;Balance OT goals addressed during session: ADL's and self-care      AM-PAC OT "6 Clicks" Daily Activity     Outcome Measure   Help from another person eating meals?: None Help from another person taking care of personal grooming?: A Little Help from another person toileting, which includes using toliet, bedpan, or urinal?: A Lot Help from another person bathing (including washing, rinsing, drying)?: A Lot Help from another person to put on and taking off regular upper body  clothing?: A Little Help from another person to put on and taking off regular lower body clothing?: A Little 6 Click Score: 17    End of Session    OT Visit Diagnosis: Unsteadiness on feet (R26.81);Muscle weakness (generalized) (M62.81);Other abnormalities of gait and mobility (R26.89);Pain Pain - Right/Left: Right Pain - part of body: Leg   Activity Tolerance Patient tolerated treatment well   Patient Left in bed;with call bell/phone within reach;with bed alarm set;with family/visitor present   Nurse Communication          Time: 1610-9604 OT Time Calculation (min): 21 min  Charges: OT General Charges $OT Visit: 1 Visit OT Treatments $Therapeutic Activity: 8-22 mins  Arman Filter., MPH, MS, OTR/L ascom [phone] 06/05/23, 10:18 AM

## 2023-06-05 NOTE — Progress Notes (Addendum)
Progress Note    Allen Chavez  ZOX:[id number] DOB: 17-Sep-1955  DOA: 05/28/2023 PCP: [name], MD      Brief Narrative:    Medical records reviewed and are as summarized below:  Allen Chavez is a 67 y.o. male with medical history significant of insulin-dependent diabetes mellitus, hypertension, PAD and CKD stage IIIa presented to ED with generalized malaise and worsening right foot wound with concern of gangrene and surrounding erythema with tracking up to upper legs for about 2 days prior to admission.  Patient also endorsed some subjective fever and chills at home.  He also had poor oral intake.   On presentation  Patient was afebrile, tachycardic at 112 and softer blood pressure 94/52.  Labs pertinent for leukocytosis at 18.1, absolute neutrophil 15.8, hemoglobin 11.5, lactic acid 3.7, sodium 133, CO2 21, glucose 157, BUN 45, creatinine 3.24 with GFR of 20, baseline seems to be around 1.4-1.5.  CXR with no active cardiopulmonary disease. Right foot imaging with asymmetric soft tissue swelling around fifth toe with some probable small amount of air in the soft tissue.  Also noted to have a small cortical breach in the distal phalanx, concerning for acute osteomyelitis.   EKG shows sinus tachycardia without acute ST-T changes   Patient met severe sepsis criteria and blood cultures were drawn.  Received IV fluid and started on broad-spectrum antibiotics.  Vascular surgery and podiatry was consulted.  10/31: Afebrile this morning, maximum temperature recorded of 100.5 overnight.  Lactic acidosis resolved, as some improvement in leukocytosis and hemoglobin decreased to 9.3, all cell line decreased so likely some dilutional effect.  Significantly elevated CRP at 27.3, ESR 81, A1c 7.1, TSH elevated at 12.732, checking thyroid panel.  Preliminary blood cultures negative.  MRI of right foot with concern of osteo of fifth toe, no discrete abscess.  Going for angiography with vascular  today, followed by likely transmetatarsal amputation on Friday or Saturday Hypoglycemia as patient was n.p.o., switching fluids to LR with D5  11/1: S/p angiography and stents placement in right lower extremity.  Transmetatarsal amputation scheduled for tomorrow.  Creatinine slowly improving, at 1.92 today, improving leukocytosis And stable hemoglobin.  Anemia panel consistent with anemia of chronic disease with some iron and B12 deficiency.  B12 at 177.  Ordered IM replacement and also starting on supplement.  11/2: Patient had his right transmetatarsal amputation earlier today, tolerated the procedure well. Thyroid panel with some improvement in TSH to 7, increased T3 uptake but normal T4 and T3. Will need a repeat in 3 to 4 weeks by PCP.  11/3: Vital stable, creatinine continue to slowly improve, at 1.67 today.  Persistent leukocytosis likely some reactive aliment as patient had his TMA yesterday.  Preliminary wound culture with no organism but patient was already on antibiotics.  Pending PT/OT evaluation  11/4: Podiatry is concerned about a spot at his stump, will be high risk for BKA due to decreased blood supply.  PT is recommending SNF.  Cultures are growing group C strep, antibiotics switched with Unasyn.  11/5: Patient with failing TMA flap, vascular surgery was reconsulted and patient will be going for BKA on right, likely tomorrow depending OR availability.      Assessment/Plan:   Principal Problem:   Severe sepsis (HCC) Active Problems:   AKI (acute kidney injury) (HCC)   Peripheral arterial disease (HCC)   Type 2 diabetes mellitus (HCC)   Essential (primary) hypertension   Hyperlipidemia   GERD (gastroesophageal reflux disease)  Malignant neoplasm of prostate (HCC)   Gangrene of right foot (HCC)   Necrotizing subcutaneous infection (HCC)   Elevated TSH   Normocytic anemia   Acute blood loss as cause of postoperative anemia   Nutrition Problem: Increased nutrient  needs Etiology: post-op healing  Signs/Symptoms: estimated needs   Body mass index is 22.98 kg/m.   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  S/p right foot transmetatarsal amputation on 05/31/2023.  Right foot deep wound cultures from 05/31/2023 showed rare Streptococcus group G.  No growth on blood cultures. Leukocytosis has improved.  Discontinue IV Unasyn. IV Dilaudid and oxycodone as needed for pain Lactic acid was 3.7 on admission.  He was treated with IV fluids and IV antibiotics.   Peripheral vascular disease: S/p percutaneous transluminal angioplasty and stent placement to right SFA and popliteal arteries on 05/29/2023 History of left BKA many years ago   Acute blood loss anemia, normocytic anemia: Hemoglobin dropped from 10.6-7.2.  Transfuse 1 units of packed red blood cells.  Discussed risks, benefits and alternatives to blood transfusion with the patient and his son at the bedside.  Patient is agreeable to blood transfusion. Vitamin B12 deficiency (vitamin B12 177): Continue vitamin B12 supplement. Ferritin 510, iron 15, iron saturation ratio 7, TIBC 218.   AKI on CKD stage IIIa : Improved.   Creatinine was 3.24 on admission.   Hypertension: Lisinopril on hold because of AKI    Type II DM with hyperglycemia: Continue insulin glargine 20 units twice daily.  NovoLog as needed per sliding scale. He was taking Lantus 60 units daily and NovoLog 2 to 10 units with meals at home. Hemoglobin A1c 7.7 in September 2024   Elevated TSH: Initial TSH was 12.732 on 05/28/2023.  Repeat TSH was 7.430 on 05/29/2023.  Free T4 was normal.  This could be subclinical hypothyroidism.  Repeat thyroid function tests as an outpatient in 4 to 6 weeks    Other comorbidities include hyperlipidemia, GERD  Diet Order             Diet regular Room service appropriate? Yes; Fluid consistency: Thin  Diet effective now                             Consultants: Vascular surgeon Podiatrist  Procedures: Right BKA on 06/04/2023 Right foot transmetatarsal amputation 05/31/2023 Percutaneous transluminal angioplasty and stent placement to right lower extremity popliteal and right SFA arteries    Medications:    sodium chloride   Intravenous Once   vitamin C  500 mg Oral BID   aspirin EC  81 mg Oral Daily   atorvastatin  40 mg Oral Daily   clopidogrel  75 mg Oral Daily   Fe Fum-Vit C-Vit B12-FA  1 capsule Oral BID   fenofibrate  160 mg Oral Daily   heparin  5,000 Units Subcutaneous Q8H   insulin aspart  0-15 Units Subcutaneous TID WC   insulin aspart  0-5 Units Subcutaneous QHS   insulin aspart  5 Units Subcutaneous TID WC   insulin glargine-yfgn  20 Units Subcutaneous BID   lactulose  30 g Oral BID   melatonin  5 mg Oral QHS   multivitamin with minerals  1 tablet Oral Daily   nutrition supplement (JUVEN)  1 packet Oral BID BM   pantoprazole  40 mg Oral Daily   polyethylene glycol  17 g Oral BID   sodium chloride flush  3 mL Intravenous Q12H   zinc sulfate  220 mg Oral Daily   Continuous Infusions:     Anti-infectives (From admission, onward)    Start     Dose/Rate Route Frequency Ordered Stop   06/02/23 1800  Ampicillin-Sulbactam (UNASYN) 3 g in sodium chloride 0.9 % 100 mL IVPB  Status:  Discontinued        3 g 200 mL/hr over 30 Minutes Intravenous Every 6 hours 06/02/23 1408 06/05/23 0740   06/01/23 1200  vancomycin (VANCOREADY) IVPB 1250 mg/250 mL  Status:  Discontinued        1,250 mg 166.7 mL/hr over 90 Minutes Intravenous Every 24 hours 06/01/23 0918 06/02/23 1408   05/31/23 1345  vancomycin (VANCOREADY) IVPB 1250 mg/250 mL        1,250 mg 166.7 mL/hr over 90 Minutes Intravenous  Once 05/31/23 1254 05/31/23 1434   05/30/23 0900  vancomycin (VANCOREADY) IVPB 1250 mg/250 mL        1,250 mg 166.7 mL/hr over 90 Minutes Intravenous  Once 05/30/23 0811 05/30/23 1135   05/30/23 0815  ceFEPIme  (MAXIPIME) 2 g in sodium chloride 0.9 % 100 mL IVPB  Status:  Discontinued        2 g 200 mL/hr over 30 Minutes Intravenous Every 12 hours 05/30/23 0812 06/02/23 1408   05/29/23 1600  ceFEPIme (MAXIPIME) 2 g in sodium chloride 0.9 % 100 mL IVPB  Status:  Discontinued        2 g 200 mL/hr over 30 Minutes Intravenous Every 24 hours 05/28/23 1801 05/30/23 0812   05/29/23 1556  ceFAZolin (ANCEF) IVPB 2g/100 mL premix  Status:  Discontinued        2 g 200 mL/hr over 30 Minutes Intravenous 30 min pre-op 05/29/23 1556 05/29/23 1747   05/29/23 0800  vancomycin variable dose per unstable renal function (pharmacist dosing)  Status:  Discontinued         Does not apply See admin instructions 05/28/23 1808 06/01/23 0918   05/29/23 0600  metroNIDAZOLE (FLAGYL) IVPB 500 mg  Status:  Discontinued        500 mg 100 mL/hr over 60 Minutes Intravenous Every 12 hours 05/28/23 1746 06/02/23 1408   05/28/23 1900  vancomycin (VANCOREADY) IVPB 750 mg/150 mL        750 mg 150 mL/hr over 60 Minutes Intravenous  Once 05/28/23 1802 05/28/23 2139   05/28/23 1615  ceFEPIme (MAXIPIME) 2 g in sodium chloride 0.9 % 100 mL IVPB        2 g 200 mL/hr over 30 Minutes Intravenous  Once 05/28/23 1609 05/28/23 1715   05/28/23 1615  metroNIDAZOLE (FLAGYL) IVPB 500 mg        500 mg 100 mL/hr over 60 Minutes Intravenous  Once 05/28/23 1609 05/28/23 1829   05/28/23 1615  vancomycin (VANCOCIN) IVPB 1000 mg/200 mL premix        1,000 mg 200 mL/hr over 60 Minutes Intravenous  Once 05/28/23 1609 05/28/23 2026              Family Communication/Anticipated D/C date and plan/Code Status   DVT prophylaxis: heparin injection 5,000 Units Start: 05/28/23 1730     Code Status: Full Code  Family Communication: Plan discussed with Dewayne Hatch, daughter, at the bedside Disposition Plan: Plan to discharge to SNF   Status is: Inpatient Remains inpatient appropriate because: S/p right BKA       Subjective:   Interval events  noted.  He complains of pain at  the right BKA stump wound.  He also complains of general weakness and fatigue.  No shortness of breath or chest pain.  Baldo Ash, son, was at the bedside  Objective:    Vitals:   06/05/23 1125 06/05/23 1156 06/05/23 1156 06/05/23 1325  BP: 138/78 (!) 140/71 (!) 140/71 (!) 143/73  Pulse: 90 91 91 90  Resp: 16 20 20 16   Temp: 97.7 F (36.5 C) 98.1 F (36.7 C) 98.1 F (36.7 C) 98.1 F (36.7 C)  TempSrc:      SpO2: 99%  97% 98%  Weight:      Height:       No data found.   Intake/Output Summary (Last 24 hours) at 06/05/2023 1409 Last data filed at 06/05/2023 0531 Gross per 24 hour  Intake --  Output 1175 ml  Net -1175 ml   Filed Weights   06/02/23 0500 06/03/23 0500 06/05/23 0500  Weight: 84.4 kg 79.4 kg 81.2 kg    Exam:  GEN: NAD SKIN: Warm and dry EYES: Pale but anicteric ENT: MMM CV: RRR PULM: CTA B ABD: soft, ND, NT, +BS CNS: AAO x 3, non focal EXT: Dressing on right AKA stump is clean, dry and intact.  Old left BKA.        Data Reviewed:   I have personally reviewed following labs and imaging studies:  Labs: Labs show the following:   Basic Metabolic Panel: Recent Labs  Lab 05/30/23 0321 05/31/23 1214 06/01/23 0354 06/03/23 0529 06/04/23 0514 06/05/23 0522  NA 135 136 139  --  140 135  K 3.6 3.9 3.9  --  3.5 4.1  CL 104 108 111  --  110 107  CO2 22 21* 21*  --  23 22  GLUCOSE 116* 89 148*  --  81 259*  BUN 33* 39* 39*  --  30* 31*  CREATININE 1.92* 1.90* 1.67* 1.51* 1.37* 1.27*  CALCIUM 8.1* 7.8* 8.1*  --  8.4* 7.6*  PHOS 2.0*  --   --   --   --   --    GFR Estimated Creatinine Clearance: 63.9 mL/min (A) (by C-G formula based on SCr of 1.27 mg/dL (H)). Liver Function Tests: Recent Labs  Lab 05/30/23 0321  ALBUMIN 2.5*   No results for input(s): "LIPASE", "AMYLASE" in the last 168 hours. No results for input(s): "AMMONIA" in the last 168 hours. Coagulation profile No results for input(s): "INR", "PROTIME"  in the last 168 hours.   CBC: Recent Labs  Lab 05/30/23 0321 05/31/23 1214 06/01/23 0354 06/04/23 0514 06/05/23 0522  WBC 11.9* 16.2* 16.6* 16.7* 11.8*  HGB 9.3* 8.4* 8.5* 10.6* 7.2*  HCT 26.5* 24.2* 24.5* 30.7* 20.9*  MCV 82.3 81.2 80.9 80.6 80.1  PLT 273 265 303 465* 441*   Cardiac Enzymes: No results for input(s): "CKTOTAL", "CKMB", "CKMBINDEX", "TROPONINI" in the last 168 hours. BNP (last 3 results) No results for input(s): "PROBNP" in the last 8760 hours. CBG: Recent Labs  Lab 06/04/23 1148 06/04/23 1816 06/04/23 2119 06/05/23 0737 06/05/23 1203  GLUCAP 200* 173* 203* 240* 194*   D-Dimer: No results for input(s): "DDIMER" in the last 72 hours. Hgb A1c: No results for input(s): "HGBA1C" in the last 72 hours. Lipid Profile: No results for input(s): "CHOL", "HDL", "LDLCALC", "TRIG", "CHOLHDL", "LDLDIRECT" in the last 72 hours. Thyroid function studies: No results for input(s): "TSH", "T4TOTAL", "T3FREE", "THYROIDAB" in the last 72 hours.  Invalid input(s): "FREET3" Anemia work up: No results for input(s): "VITAMINB12", "FOLATE", "FERRITIN", "  TIBC", "IRON", "RETICCTPCT" in the last 72 hours. Sepsis Labs: Recent Labs  Lab 05/31/23 1214 06/01/23 0354 06/04/23 0514 06/05/23 0522  WBC 16.2* 16.6* 16.7* 11.8*    Microbiology Recent Results (from the past 240 hour(s))  Culture, blood (Routine x 2)     Status: None   Collection Time: 05/28/23  3:40 PM   Specimen: BLOOD  Result Value Ref Range Status   Specimen Description BLOOD BLOOD RIGHT ARM  Final   Special Requests   Final    BOTTLES DRAWN AEROBIC AND ANAEROBIC Blood Culture results may not be optimal due to an excessive volume of blood received in culture bottles   Culture   Final    NO GROWTH 5 DAYS Performed at [facility], [address], [place], [place] [address]    Report Status 06/02/2023 FINAL  Final  Culture, blood (Routine x 2)     Status: None   Collection Time: 05/28/23  4:30  PM   Specimen: BLOOD  Result Value Ref Range Status   Specimen Description BLOOD LEFT ANTECUBITAL  Final   Special Requests   Final    BOTTLES DRAWN AEROBIC AND ANAEROBIC Blood Culture adequate volume   Culture   Final    NO GROWTH 5 DAYS Performed at [facility], [address], [place], [place] [address]    Report Status 06/02/2023 FINAL  Final  Surgical PCR screen     Status: None   Collection Time: 05/31/23 12:00 AM   Specimen: Nasal Mucosa; Nasal Swab  Result Value Ref Range Status   MRSA, PCR NEGATIVE NEGATIVE Final   Staphylococcus aureus NEGATIVE NEGATIVE Final    Comment: (NOTE) The Xpert SA Assay (FDA approved for NASAL specimens in patients 54 years of age and older), is one component of a comprehensive surveillance program. It is not intended to diagnose infection nor to guide or monitor treatment. Performed at [facility], [address] [place], [place] [address]   Aerobic/Anaerobic Culture w Gram Stain (surgical/deep wound)     Status: None   Collection Time: 05/31/23  9:07 AM   Specimen: Wound; Tissue  Result Value Ref Range Status   Specimen Description   Final    WOUND Performed at [facility], [address] [place], [place] [address]    Special Requests   Final    NONE Performed at [facility], [address] [place], [place] [address]    Gram Stain NO WBC SEEN NO ORGANISMS SEEN   Final   Culture   Final    RARE STREPTOCOCCUS GROUP G Beta hemolytic streptococci are predictably susceptible to penicillin and other beta lactams. Susceptibility testing not routinely performed. NO ANAEROBES ISOLATED Performed at [facility], [address] [place], [place] [address]    Report Status 06/05/2023 FINAL  Final  Aerobic/Anaerobic Culture w Gram Stain (surgical/deep wound)     Status: None   Collection Time: 05/31/23  9:12 AM   Specimen: Wound; Tissue  Result Value Ref Range Status   Specimen  Description   Final    WOUND Performed at [facility], [address], [place], [place] [address]    Special Requests   Final    NONE Performed at [facility], [address] [place], [place] [address]    Gram Stain NO WBC SEEN NO ORGANISMS SEEN   Final   Culture   Final    RARE STREPTOCOCCUS GROUP G Beta hemolytic streptococci are  predictably susceptible to penicillin and other beta lactams. Susceptibility testing not routinely performed. NO ANAEROBES ISOLATED Performed at [facility], [address], [place], [place] [address]    Report Status 06/05/2023 FINAL  Final    Procedures and diagnostic studies:  No results found.             LOS: 8 days   Ellyana Crigler  [facility]   Pager on [email or web address]. If 7PM-7AM, please contact night-coverage at [email or web address]     06/05/2023, 2:09 PM

## 2023-06-05 NOTE — Plan of Care (Signed)

## 2023-06-05 NOTE — TOC Progression Note (Signed)
Transition of Care [facility]) - Progression Note    Patient Details  Name: Allen Chavez MRN: [id number] Date of Birth: 12/03/1955  Transition of Care [facility]) CM/SW Contact  Marlowe Sax, RN Phone Number: 06/05/2023, 3:45 PM  Clinical Narrative:    Met with the patient and reviewed the bed offers again, he chose [facility], I notified Tonya at [facility] and Ins is pending   Expected Discharge Plan: Skilled Nursing Facility Barriers to Discharge: SNF Pending bed offer, Insurance Authorization  Expected Discharge Plan and Services In-house Referral: Clinical Social Work Discharge Planning Services: Other - See comment (Pending)   Living arrangements for the past 2 months: Single Family Home                 DME Arranged: N/A DME Agency: NA       HH Arranged: NA (Pending) HH Agency: NA (Pending)         Social Determinants of Health (SDOH) Interventions SDOH Screenings   Food Insecurity: No Food Insecurity (05/28/2023)  Housing: Low Risk  (05/28/2023)  Transportation Needs: No Transportation Needs (05/28/2023)  Utilities: Not At Risk (05/28/2023)  Financial Resource Strain: Patient Declined (09/30/2022)   Received from [facility], [facility]  Tobacco Use: High Risk (06/04/2023)    Readmission Risk Interventions     No data to display

## 2023-06-05 NOTE — Progress Notes (Signed)
  Progress Note    06/05/2023 8:20 AM 1 Day Post-Op  Subjective:  Allen Chavez is a 67 yo male now POD #1 from Right BKA.  Patient is recovering as expected.  Patient is resting comfortably in bed this morning eating breakfast.  Patient does endorse some pain at the site.  Dressing remains clean dry and intact.  No hematoma seroma.  No complaints overnight.  Vitals all remained stable.   Vitals:   06/04/23 2107 06/05/23 0121  BP: 131/79 (!) 144/83  Pulse: 93   Resp: 20 20  Temp: 98.2 F (36.8 C) 97.9 F (36.6 C)  SpO2: 97% 92%   Physical Exam: Cardiac:  RRR, S1, S2 no murmurs appreciated Lungs: Lungs clear on auscultation.  No rales rhonchi or wheezing noted. Incisions: Right lower extremity BKA with dressing applied.  Clean dry and intact.  No drainage noted. Extremities: Bilateral BKA's.  Right BKA from 06/04/2023.  Left BKA prior Abdomen: Positive bowel sounds throughout, soft, nontender nondistended. Neurologic: AAOX4, answers questions and follows commands appropriately.  CBC    Component Value Date/Time   WBC 11.8 (H) 06/05/2023 0522   RBC 2.61 (L) 06/05/2023 0522   HGB 7.2 (L) 06/05/2023 0522   HGB 11.5 (L) 11/15/2014 0508   HCT 20.9 (L) 06/05/2023 0522   HCT 34.5 (L) 11/15/2014 0508   PLT 441 (H) 06/05/2023 0522   PLT 173 11/15/2014 0508   MCV 80.1 06/05/2023 0522   MCV 87 11/15/2014 0508   MCH 27.6 06/05/2023 0522   MCHC 34.4 06/05/2023 0522   RDW 16.7 (H) 06/05/2023 0522   RDW 14.0 11/15/2014 0508   LYMPHSABS 1.0 05/28/2023 1535   LYMPHSABS 1.4 11/15/2014 0508   MONOABS 1.1 (H) 05/28/2023 1535   MONOABS 0.6 11/15/2014 0508   EOSABS 0.0 05/28/2023 1535   EOSABS 0.0 11/15/2014 0508   BASOSABS 0.1 05/28/2023 1535   BASOSABS 0.1 11/15/2014 0508    BMET    Component Value Date/Time   NA 135 06/05/2023 0522   NA 135 11/15/2014 0508   K 4.1 06/05/2023 0522   K 4.1 11/15/2014 0508   CL 107 06/05/2023 0522   CL 108 11/15/2014 0508   CO2 22 06/05/2023  0522   CO2 26 11/15/2014 0508   GLUCOSE 259 (H) 06/05/2023 0522   GLUCOSE 167 (H) 11/15/2014 0508   BUN 31 (H) 06/05/2023 0522   BUN 26 (H) 11/15/2014 0508   CREATININE 1.27 (H) 06/05/2023 0522   CREATININE 0.91 11/15/2014 0508   CALCIUM 7.6 (L) 06/05/2023 0522   CALCIUM 8.0 (L) 11/15/2014 0508   GFRNONAA >60 06/05/2023 0522   GFRNONAA >60 11/15/2014 0508   GFRAA >60 11/15/2014 0508    INR    Component Value Date/Time   INR 1.3 (H) 05/28/2023 1535   INR 0.9 11/07/2014 0927     Intake/Output Summary (Last 24 hours) at 06/05/2023 0820 Last data filed at 06/05/2023 0531 Gross per 24 hour  Intake 800 ml  Output 1325 ml  Net -525 ml     Assessment/Plan:  67 y.o. male is s/p right below the knee amputation 1 Day Post-Op   PLAN: Advance diet as tolerated. Pain medication as needed. PT OT eval. First dressing change early next week. Recovering as expected.  DVT prophylaxis: ASA 81 mg daily, Plavix 75 mg daily.   Marcie Bal [facility] 06/05/2023 8:20 AM

## 2023-06-06 DIAGNOSIS — R6520 Severe sepsis without septic shock: Secondary | ICD-10-CM | POA: Diagnosis not present

## 2023-06-06 DIAGNOSIS — A419 Sepsis, unspecified organism: Secondary | ICD-10-CM | POA: Diagnosis not present

## 2023-06-06 LAB — GLUCOSE, CAPILLARY
Glucose-Capillary: 78 mg/dL (ref 70–99)
Glucose-Capillary: 151 mg/dL — ABNORMAL HIGH (ref 70–99)
Glucose-Capillary: 68 mg/dL — ABNORMAL LOW (ref 70–99)
Glucose-Capillary: 107 mg/dL — ABNORMAL HIGH (ref 70–99)
Glucose-Capillary: 122 mg/dL — ABNORMAL HIGH (ref 70–99)

## 2023-06-06 LAB — TYPE AND SCREEN
ABO/RH(D): B POS
Antibody Screen: NEGATIVE
Unit division: 0

## 2023-06-06 LAB — CBC WITH DIFFERENTIAL/PLATELET
Abs Immature Granulocytes: 0.33 10*3/uL — ABNORMAL HIGH (ref 0.00–0.07)
Basophils Absolute: 0 10*3/uL (ref 0.0–0.1)
Basophils Relative: 0 %
Eosinophils Absolute: 0.4 10*3/uL (ref 0.0–0.5)
Eosinophils Relative: 3 %
HCT: 24.2 % — ABNORMAL LOW (ref 39.0–52.0)
Hemoglobin: 8.3 g/dL — ABNORMAL LOW (ref 13.0–17.0)
Immature Granulocytes: 3 %
Lymphocytes Relative: 13 %
Lymphs Abs: 1.6 10*3/uL (ref 0.7–4.0)
MCH: 28 pg (ref 26.0–34.0)
MCHC: 34.3 g/dL (ref 30.0–36.0)
MCV: 81.8 fL (ref 80.0–100.0)
Monocytes Absolute: 0.7 10*3/uL (ref 0.1–1.0)
Monocytes Relative: 6 %
Neutro Abs: 9.1 10*3/uL — ABNORMAL HIGH (ref 1.7–7.7)
Neutrophils Relative %: 75 %
Platelets: 430 10*3/uL — ABNORMAL HIGH (ref 150–400)
RBC: 2.96 MIL/uL — ABNORMAL LOW (ref 4.22–5.81)
RDW: 16.6 % — ABNORMAL HIGH (ref 11.5–15.5)
WBC: 12.2 10*3/uL — ABNORMAL HIGH (ref 4.0–10.5)
nRBC: 0 % (ref 0.0–0.2)

## 2023-06-06 LAB — BPAM RBC
Blood Product Expiration Date: 202412092359
ISSUE DATE / TIME: 202411071115
Unit Type and Rh: 7300

## 2023-06-06 LAB — SURGICAL PATHOLOGY

## 2023-06-06 NOTE — Progress Notes (Signed)
Progress Note    06/06/2023 7:08 AM 2 Days Post-Op  Subjective:  Allen Chavez is a 67 yo male now POD #2 from Right BKA.  Patient is recovering as expected.  Patient is resting comfortably in bed this morning eating breakfast.  Patient does endorse some pain at the site.  Dressing remains clean dry and intact.  No hematoma seroma.  No complaints overnight.  Vitals all remained stable.    Vitals:   06/05/23 1537 06/05/23 2141  BP: 138/64 (!) 158/73  Pulse: 80 92  Resp: 17 18  Temp: 98.8 F (37.1 C) 98.6 F (37 C)  SpO2: 96% 99%   Physical Exam: Cardiac:  RRR, S1, S2 no murmurs appreciated Lungs: Lungs clear on auscultation.  No rales rhonchi or wheezing noted. Incisions: Right lower extremity BKA with dressing applied.  Clean dry and intact.  No drainage noted. Extremities: Bilateral BKA's.  Right BKA from 06/04/2023.  Left BKA prior Abdomen: Positive bowel sounds throughout, soft, nontender nondistended. Neurologic: AAOX4, answers questions and follows commands appropriately.  CBC    Component Value Date/Time   WBC 12.2 (H) 06/06/2023 0224   RBC 2.96 (L) 06/06/2023 0224   HGB 8.3 (L) 06/06/2023 0224   HGB 11.5 (L) 11/15/2014 0508   HCT 24.2 (L) 06/06/2023 0224   HCT 34.5 (L) 11/15/2014 0508   PLT 430 (H) 06/06/2023 0224   PLT 173 11/15/2014 0508   MCV 81.8 06/06/2023 0224   MCV 87 11/15/2014 0508   MCH 28.0 06/06/2023 0224   MCHC 34.3 06/06/2023 0224   RDW 16.6 (H) 06/06/2023 0224   RDW 14.0 11/15/2014 0508   LYMPHSABS 1.6 06/06/2023 0224   LYMPHSABS 1.4 11/15/2014 0508   MONOABS 0.7 06/06/2023 0224   MONOABS 0.6 11/15/2014 0508   EOSABS 0.4 06/06/2023 0224   EOSABS 0.0 11/15/2014 0508   BASOSABS 0.0 06/06/2023 0224   BASOSABS 0.1 11/15/2014 0508    BMET    Component Value Date/Time   NA 135 06/05/2023 0522   NA 135 11/15/2014 0508   K 4.1 06/05/2023 0522   K 4.1 11/15/2014 0508   CL 107 06/05/2023 0522   CL 108 11/15/2014 0508   CO2 22 06/05/2023 0522    CO2 26 11/15/2014 0508   GLUCOSE 259 (H) 06/05/2023 0522   GLUCOSE 167 (H) 11/15/2014 0508   BUN 31 (H) 06/05/2023 0522   BUN 26 (H) 11/15/2014 0508   CREATININE 1.27 (H) 06/05/2023 0522   CREATININE 0.91 11/15/2014 0508   CALCIUM 7.6 (L) 06/05/2023 0522   CALCIUM 8.0 (L) 11/15/2014 0508   GFRNONAA >60 06/05/2023 0522   GFRNONAA >60 11/15/2014 0508   GFRAA >60 11/15/2014 0508    INR    Component Value Date/Time   INR 1.3 (H) 05/28/2023 1535   INR 0.9 11/07/2014 0927     Intake/Output Summary (Last 24 hours) at 06/06/2023 0708 Last data filed at 06/06/2023 0500 Gross per 24 hour  Intake 512 ml  Output 450 ml  Net 62 ml     Assessment/Plan:  67 y.o. male is s/p right below the knee amputation  2 Days Post-Op   PLAN Advance diet as tolerated. Pain medication as needed. PT OT eval.  Stretching Exercises Q 1 Hr while awake to right BKA to prevent contracture.  First dressing change early next week. Recovering as expected.  DVT prophylaxis:  ASA 81 mg daily, Plavix 75 mg daily.    Marcie Bal [facility] 06/06/2023 7:08 AM

## 2023-06-06 NOTE — Progress Notes (Signed)
Progress Note    Allen Chavez  ZOX:[id number] DOB: 1956-04-22  DOA: 05/28/2023 PCP: [name], MD      Brief Narrative:    Medical records reviewed and are as summarized below:  Allen Chavez is a 67 y.o. male with medical history significant of insulin-dependent diabetes mellitus, hypertension, PAD and CKD stage IIIa presented to ED with generalized malaise and worsening right foot wound with concern of gangrene and surrounding erythema with tracking up to upper legs for about 2 days prior to admission.  Patient also endorsed some subjective fever and chills at home.  He also had poor oral intake.   On presentation  Patient was afebrile, tachycardic at 112 and softer blood pressure 94/52.  Labs pertinent for leukocytosis at 18.1, absolute neutrophil 15.8, hemoglobin 11.5, lactic acid 3.7, sodium 133, CO2 21, glucose 157, BUN 45, creatinine 3.24 with GFR of 20, baseline seems to be around 1.4-1.5.  CXR with no active cardiopulmonary disease. Right foot imaging with asymmetric soft tissue swelling around fifth toe with some probable small amount of air in the soft tissue.  Also noted to have a small cortical breach in the distal phalanx, concerning for acute osteomyelitis.   EKG shows sinus tachycardia without acute ST-T changes   Patient met severe sepsis criteria and blood cultures were drawn.  Received IV fluid and started on broad-spectrum antibiotics.  Vascular surgery and podiatry was consulted.  10/31: Afebrile this morning, maximum temperature recorded of 100.5 overnight.  Lactic acidosis resolved, as some improvement in leukocytosis and hemoglobin decreased to 9.3, all cell line decreased so likely some dilutional effect.  Significantly elevated CRP at 27.3, ESR 81, A1c 7.1, TSH elevated at 12.732, checking thyroid panel.  Preliminary blood cultures negative.  MRI of right foot with concern of osteo of fifth toe, no discrete abscess.  Going for angiography with vascular  today, followed by likely transmetatarsal amputation on Friday or Saturday Hypoglycemia as patient was n.p.o., switching fluids to LR with D5  11/1: S/p angiography and stents placement in right lower extremity.  Transmetatarsal amputation scheduled for tomorrow.  Creatinine slowly improving, at 1.92 today, improving leukocytosis And stable hemoglobin.  Anemia panel consistent with anemia of chronic disease with some iron and B12 deficiency.  B12 at 177.  Ordered IM replacement and also starting on supplement.  11/2: Patient had his right transmetatarsal amputation earlier today, tolerated the procedure well. Thyroid panel with some improvement in TSH to 7, increased T3 uptake but normal T4 and T3. Will need a repeat in 3 to 4 weeks by PCP.  11/3: Vital stable, creatinine continue to slowly improve, at 1.67 today.  Persistent leukocytosis likely some reactive aliment as patient had his TMA yesterday.  Preliminary wound culture with no organism but patient was already on antibiotics.  Pending PT/OT evaluation  11/4: Podiatry is concerned about a spot at his stump, will be high risk for BKA due to decreased blood supply.  PT is recommending SNF.  Cultures are growing group C strep, antibiotics switched with Unasyn.  11/5: Patient with failing TMA flap, vascular surgery was reconsulted and patient will be going for BKA on right, likely tomorrow depending OR availability.      Assessment/Plan:   Principal Problem:   Severe sepsis (HCC) Active Problems:   AKI (acute kidney injury) (HCC)   Peripheral arterial disease (HCC)   Type 2 diabetes mellitus (HCC)   Essential (primary) hypertension   Hyperlipidemia   GERD (gastroesophageal reflux disease)  Malignant neoplasm of prostate (HCC)   Gangrene of right foot (HCC)   Necrotizing subcutaneous infection (HCC)   Elevated TSH   Normocytic anemia   Acute blood loss as cause of postoperative anemia   Nutrition Problem: Increased nutrient  needs Etiology: post-op healing  Signs/Symptoms: estimated needs   Body mass index is 22.45 kg/m.   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  S/p right foot transmetatarsal amputation on 05/31/2023.  Right foot deep wound cultures from 05/31/2023 showed rare Streptococcus group G.  No growth on blood cultures. Leukocytosis has improved.  IV Unasyn was discontinued on 06/05/2023. IV Dilaudid and oxycodone as needed for pain Lactic acid was 3.7 on admission.  He was treated with IV fluids and IV antibiotics.   Peripheral vascular disease: S/p percutaneous transluminal angioplasty and stent placement to right SFA and popliteal arteries on 05/29/2023 History of left BKA many years ago   Acute blood loss anemia, normocytic anemia: Hemoglobin improved from 7.2-8.3.  S/p transfusion with 1 unit of PRBCs on 06/05/2023.   Vitamin B12 deficiency (vitamin B12 177): Continue vitamin B12 supplement. Ferritin 510, iron 15, iron saturation ratio 7, TIBC 218.   AKI on CKD stage IIIa : Improved.   Creatinine was 3.24 on admission.   Hypertension: Lisinopril on hold because of AKI    Type II DM with hyperglycemia: Continue insulin glargine 20 units twice daily.  NovoLog as needed per sliding scale. He was taking Lantus 60 units daily and NovoLog 2 to 10 units with meals at home. Hemoglobin A1c 7.7 in September 2024   Elevated TSH: Initial TSH was 12.732 on 05/28/2023.  Repeat TSH was 7.430 on 05/29/2023.  Free T4 was normal.  This could be subclinical hypothyroidism.  Repeat thyroid function tests as an outpatient in 4 to 6 weeks    Other comorbidities include hyperlipidemia, GERD  Diet Order             Diet regular Room service appropriate? Yes; Fluid consistency: Thin  Diet effective now                            Consultants: Vascular surgeon Podiatrist  Procedures: Right BKA on 06/04/2023 Right foot transmetatarsal amputation  05/31/2023 Percutaneous transluminal angioplasty and stent placement to right lower extremity popliteal and right SFA arteries    Medications:    sodium chloride   Intravenous Once   vitamin C  500 mg Oral BID   aspirin EC  81 mg Oral Daily   atorvastatin  40 mg Oral Daily   clopidogrel  75 mg Oral Daily   Fe Fum-Vit C-Vit B12-FA  1 capsule Oral BID   fenofibrate  160 mg Oral Daily   heparin  5,000 Units Subcutaneous Q8H   insulin aspart  0-15 Units Subcutaneous TID WC   insulin aspart  0-5 Units Subcutaneous QHS   insulin aspart  5 Units Subcutaneous TID WC   insulin glargine-yfgn  20 Units Subcutaneous BID   lactulose  30 g Oral BID   melatonin  5 mg Oral QHS   multivitamin with minerals  1 tablet Oral Daily   nutrition supplement (JUVEN)  1 packet Oral BID BM   pantoprazole  40 mg Oral Daily   polyethylene glycol  17 g Oral BID   sodium chloride flush  3 mL Intravenous Q12H   zinc sulfate  220 mg Oral Daily   Continuous Infusions:  Anti-infectives (From admission, onward)    Start     Dose/Rate Route Frequency Ordered Stop   06/02/23 1800  Ampicillin-Sulbactam (UNASYN) 3 g in sodium chloride 0.9 % 100 mL IVPB  Status:  Discontinued        3 g 200 mL/hr over 30 Minutes Intravenous Every 6 hours 06/02/23 1408 06/05/23 0740   06/01/23 1200  vancomycin (VANCOREADY) IVPB 1250 mg/250 mL  Status:  Discontinued        1,250 mg 166.7 mL/hr over 90 Minutes Intravenous Every 24 hours 06/01/23 0918 06/02/23 1408   05/31/23 1345  vancomycin (VANCOREADY) IVPB 1250 mg/250 mL        1,250 mg 166.7 mL/hr over 90 Minutes Intravenous  Once 05/31/23 1254 05/31/23 1434   05/30/23 0900  vancomycin (VANCOREADY) IVPB 1250 mg/250 mL        1,250 mg 166.7 mL/hr over 90 Minutes Intravenous  Once 05/30/23 0811 05/30/23 1135   05/30/23 0815  ceFEPIme (MAXIPIME) 2 g in sodium chloride 0.9 % 100 mL IVPB  Status:  Discontinued        2 g 200 mL/hr over 30 Minutes Intravenous Every 12 hours  05/30/23 0812 06/02/23 1408   05/29/23 1600  ceFEPIme (MAXIPIME) 2 g in sodium chloride 0.9 % 100 mL IVPB  Status:  Discontinued        2 g 200 mL/hr over 30 Minutes Intravenous Every 24 hours 05/28/23 1801 05/30/23 0812   05/29/23 1556  ceFAZolin (ANCEF) IVPB 2g/100 mL premix  Status:  Discontinued        2 g 200 mL/hr over 30 Minutes Intravenous 30 min pre-op 05/29/23 1556 05/29/23 1747   05/29/23 0800  vancomycin variable dose per unstable renal function (pharmacist dosing)  Status:  Discontinued         Does not apply See admin instructions 05/28/23 1808 06/01/23 0918   05/29/23 0600  metroNIDAZOLE (FLAGYL) IVPB 500 mg  Status:  Discontinued        500 mg 100 mL/hr over 60 Minutes Intravenous Every 12 hours 05/28/23 1746 06/02/23 1408   05/28/23 1900  vancomycin (VANCOREADY) IVPB 750 mg/150 mL        750 mg 150 mL/hr over 60 Minutes Intravenous  Once 05/28/23 1802 05/28/23 2139   05/28/23 1615  ceFEPIme (MAXIPIME) 2 g in sodium chloride 0.9 % 100 mL IVPB        2 g 200 mL/hr over 30 Minutes Intravenous  Once 05/28/23 1609 05/28/23 1715   05/28/23 1615  metroNIDAZOLE (FLAGYL) IVPB 500 mg        500 mg 100 mL/hr over 60 Minutes Intravenous  Once 05/28/23 1609 05/28/23 1829   05/28/23 1615  vancomycin (VANCOCIN) IVPB 1000 mg/200 mL premix        1,000 mg 200 mL/hr over 60 Minutes Intravenous  Once 05/28/23 1609 05/28/23 2026              Family Communication/Anticipated D/C date and plan/Code Status   DVT prophylaxis: heparin injection 5,000 Units Start: 05/28/23 1730     Code Status: Full Code  Family Communication: Plan discussed with Dewayne Hatch, daughter, at the bedside Disposition Plan: Plan to discharge to SNF   Status is: Inpatient Remains inpatient appropriate because: S/p right BKA       Subjective:   Interval events noted.  He has no complaints.  He feels better today.  Baldo Ash, son, was at the bedside  Objective:    Vitals:   06/05/23 1537 06/05/23  2141  06/06/23 0500 06/06/23 0804  BP: 138/64 (!) 158/73  116/84  Pulse: 80 92  82  Resp: 17 18  16   Temp: 98.8 F (37.1 C) 98.6 F (37 C)  98.4 F (36.9 C)  TempSrc:      SpO2: 96% 99%  96%  Weight:   79.3 kg   Height:       No data found.   Intake/Output Summary (Last 24 hours) at 06/06/2023 1318 Last data filed at 06/06/2023 0900 Gross per 24 hour  Intake 570 ml  Output 600 ml  Net -30 ml   Filed Weights   06/03/23 0500 06/05/23 0500 06/06/23 0500  Weight: 79.4 kg 81.2 kg 79.3 kg    Exam:  GEN: NAD SKIN: No rash EYES: No pallor or icterus ENT: MMM CV: RRR PULM: CTA B ABD: soft, ND, NT, +BS CNS: AAO x 3, non focal EXT: Right BKA stump wound is clean, dry and intact.  Old left BKA       Data Reviewed:   I have personally reviewed following labs and imaging studies:  Labs: Labs show the following:   Basic Metabolic Panel: Recent Labs  Lab 05/31/23 1214 06/01/23 0354 06/03/23 0529 06/04/23 0514 06/05/23 0522  NA 136 139  --  140 135  K 3.9 3.9  --  3.5 4.1  CL 108 111  --  110 107  CO2 21* 21*  --  23 22  GLUCOSE 89 148*  --  81 259*  BUN 39* 39*  --  30* 31*  CREATININE 1.90* 1.67* 1.51* 1.37* 1.27*  CALCIUM 7.8* 8.1*  --  8.4* 7.6*   GFR Estimated Creatinine Clearance: 62.4 mL/min (A) (by C-G formula based on SCr of 1.27 mg/dL (H)). Liver Function Tests: No results for input(s): "AST", "ALT", "ALKPHOS", "BILITOT", "PROT", "ALBUMIN" in the last 168 hours.  No results for input(s): "LIPASE", "AMYLASE" in the last 168 hours. No results for input(s): "AMMONIA" in the last 168 hours. Coagulation profile No results for input(s): "INR", "PROTIME" in the last 168 hours.   CBC: Recent Labs  Lab 05/31/23 1214 06/01/23 0354 06/04/23 0514 06/05/23 0522 06/06/23 0224  WBC 16.2* 16.6* 16.7* 11.8* 12.2*  NEUTROABS  --   --   --   --  9.1*  HGB 8.4* 8.5* 10.6* 7.2* 8.3*  HCT 24.2* 24.5* 30.7* 20.9* 24.2*  MCV 81.2 80.9 80.6 80.1 81.8  PLT 265 303  465* 441* 430*   Cardiac Enzymes: No results for input(s): "CKTOTAL", "CKMB", "CKMBINDEX", "TROPONINI" in the last 168 hours. BNP (last 3 results) No results for input(s): "PROBNP" in the last 8760 hours. CBG: Recent Labs  Lab 06/05/23 1647 06/05/23 1800 06/05/23 2139 06/06/23 0806 06/06/23 1155  GLUCAP 197* 186* 108* 78 151*   D-Dimer: No results for input(s): "DDIMER" in the last 72 hours. Hgb A1c: No results for input(s): "HGBA1C" in the last 72 hours. Lipid Profile: No results for input(s): "CHOL", "HDL", "LDLCALC", "TRIG", "CHOLHDL", "LDLDIRECT" in the last 72 hours. Thyroid function studies: No results for input(s): "TSH", "T4TOTAL", "T3FREE", "THYROIDAB" in the last 72 hours.  Invalid input(s): "FREET3" Anemia work up: No results for input(s): "VITAMINB12", "FOLATE", "FERRITIN", "TIBC", "IRON", "RETICCTPCT" in the last 72 hours. Sepsis Labs: Recent Labs  Lab 06/01/23 0354 06/04/23 0514 06/05/23 0522 06/06/23 0224  WBC 16.6* 16.7* 11.8* 12.2*    Microbiology Recent Results (from the past 240 hour(s))  Culture, blood (Routine x 2)     Status: None  Collection Time: 05/28/23  3:40 PM   Specimen: BLOOD  Result Value Ref Range Status   Specimen Description BLOOD BLOOD RIGHT ARM  Final   Special Requests   Final    BOTTLES DRAWN AEROBIC AND ANAEROBIC Blood Culture results may not be optimal due to an excessive volume of blood received in culture bottles   Culture   Final    NO GROWTH 5 DAYS Performed at [facility], [address] [place], [place] [address]    Report Status 06/02/2023 FINAL  Final  Culture, blood (Routine x 2)     Status: None   Collection Time: 05/28/23  4:30 PM   Specimen: BLOOD  Result Value Ref Range Status   Specimen Description BLOOD LEFT ANTECUBITAL  Final   Special Requests   Final    BOTTLES DRAWN AEROBIC AND ANAEROBIC Blood Culture adequate volume   Culture   Final    NO GROWTH 5 DAYS Performed at [facility], [address] [place], [place] [address]    Report Status 06/02/2023 FINAL  Final  Surgical PCR screen     Status: None   Collection Time: 05/31/23 12:00 AM   Specimen: Nasal Mucosa; Nasal Swab  Result Value Ref Range Status   MRSA, PCR NEGATIVE NEGATIVE Final   Staphylococcus aureus NEGATIVE NEGATIVE Final    Comment: (NOTE) The Xpert SA Assay (FDA approved for NASAL specimens in patients 21 years of age and older), is one component of a comprehensive surveillance program. It is not intended to diagnose infection nor to guide or monitor treatment. Performed at [facility], [address] [place], [place] [address]   Aerobic/Anaerobic Culture w Gram Stain (surgical/deep wound)     Status: None   Collection Time: 05/31/23  9:07 AM   Specimen: Wound; Tissue  Result Value Ref Range Status   Specimen Description   Final    WOUND Performed at [facility], [address] [place], [place] [address]    Special Requests   Final    NONE Performed at [facility], [address] [place], [place] [address]    Gram Stain NO WBC SEEN NO ORGANISMS SEEN   Final   Culture   Final    RARE STREPTOCOCCUS GROUP G Beta hemolytic streptococci are predictably susceptible to penicillin and other beta lactams. Susceptibility testing not routinely performed. NO ANAEROBES ISOLATED Performed at [facility], [address] [place], [place] [address]    Report Status 06/05/2023 FINAL  Final  Aerobic/Anaerobic Culture w Gram Stain (surgical/deep wound)     Status: None   Collection Time: 05/31/23  9:12 AM   Specimen: Wound; Tissue  Result Value Ref Range Status   Specimen Description   Final    WOUND Performed at [facility], [address] [place], [place] [address]    Special Requests   Final    NONE Performed at [facility], [address] [place], [place] [address]    Gram Stain NO WBC SEEN NO ORGANISMS SEEN   Final    Culture   Final    RARE STREPTOCOCCUS GROUP G Beta hemolytic streptococci are predictably susceptible to penicillin and other beta lactams. Susceptibility testing not routinely performed. NO ANAEROBES ISOLATED Performed at [facility], [address] [place], [place] [address]    Report Status 06/05/2023 FINAL  Final    Procedures and diagnostic studies:  No results found.  LOS: 9 days   Candra Wegner  [employer]ered loss adjuster on [email or web address]. If 7PM-7AM, please contact night-coverage at [email or web address]     06/06/2023, 1:18 PM

## 2023-06-06 NOTE — Care Management Important Message (Signed)
Important Message  Patient Details  Name: Allen Chavez MRN: [id number] Date of Birth: 31-Dec-1955   Important Message Given:  Yes - Medicare IM     Olegario Messier [name] 06/06/2023, 3:06 PM

## 2023-06-06 NOTE — TOC Progression Note (Signed)
Transition of Care [facility]) - Progression Note    Patient Details  Name: Allen Chavez MRN: [id number] Date of Birth: 1956/04/11  Transition of Care [facility] CM/SW Contact  Marlowe Sax, RN Phone Number: 06/06/2023, 11:26 AM  Clinical Narrative:     Met with the patient and his son in the room, I explained that we are waiting to get Ins approval to go to [employer], I checked the Portal and it is still pending  Expected Discharge Plan: Skilled Nursing Facility Barriers to Discharge: SNF Pending bed offer, Insurance Authorization  Expected Discharge Plan and Services In-house Referral: Clinical Social Work Discharge Planning Services: Other - See comment (Pending)   Living arrangements for the past 2 months: Single Family Home                 DME Arranged: N/A DME Agency: NA       HH Arranged: NA (Pending) HH Agency: NA (Pending)         Social Determinants of Health (SDOH) Interventions SDOH Screenings   Food Insecurity: No Food Insecurity (05/28/2023)  Housing: Low Risk  (05/28/2023)  Transportation Needs: No Transportation Needs (05/28/2023)  Utilities: Not At Risk (05/28/2023)  Financial Resource Strain: Patient Declined (09/30/2022)   Received from [facility], [facility]  Tobacco Use: High Risk (06/04/2023)    Readmission Risk Interventions     No data to display

## 2023-06-06 NOTE — Plan of Care (Signed)
  Problem: Education: Goal: Knowledge of General Education information will improve Description: Including pain rating scale, medication(s)/side effects and non-pharmacologic comfort measures Outcome: Progressing   Problem: Nutrition: Goal: Adequate nutrition will be maintained Outcome: Progressing   Problem: Safety: Goal: Ability to remain free from injury will improve Outcome: Progressing   

## 2023-06-06 NOTE — Progress Notes (Signed)
Physical Therapy Treatment Patient Details Name: Allen Chavez MRN: [id number] DOB: 1956-02-24 Today's Date: 06/06/2023   History of Present Illness Allen Chavez is a 67 y.o. male with medical history significant of insulin-dependent diabetes mellitus, hypertension, PAD and CKD stage IIIa presented to ED with generalized malaise and worsening right foot wound with concern of gangrene and surrounding erythema with tracking up to upper legs for the past 2 days.  Patient also endorsed some subjective fever and chills at home.  He was having generalized malaise and poor p.o. intake for the past couple of days. Pt is now R BKA.    PT Comments  Pt is received in bed, he is agreeable to PT session. Pt performs bed mobility supA and transfers CGA-maxA x2 for safety. Pt able to perform transfer with use of sliding board initially CGA but required maxA x2 for completion of task secondary to increased fatigue and reports of testicular pain/discomfort. Pt requires frequent cuing for use of AD and sequencing to facilitate task. Notified RN regarding Pt's report of pain/discomfort. Pt would benefit from cont skilled PT to address above deficits and promote optimal return to PLOF.   If plan is discharge home, recommend the following: A little help with bathing/dressing/bathroom;A little help with walking and/or transfers;Assist for transportation;Help with stairs or ramp for entrance   Can travel by private vehicle     No  Equipment Recommendations  Other (comment) (TBD at next facility)    Recommendations for Other Services       Precautions / Restrictions Precautions Precautions: Fall Restrictions Weight Bearing Restrictions: Yes RLE Weight Bearing: Non weight bearing LLE Weight Bearing: Non weight bearing Other Position/Activity Restrictions: L skin tear cannot don prosthesis     Mobility  Bed Mobility Overal bed mobility: Needs Assistance Bed Mobility: Supine to Sit     Supine to sit:  Supervision, HOB elevated, Used rails     General bed mobility comments: Pt able to perform bed mobility supA with occasional cuing for hand placement and safety techniques    Transfers Overall transfer level: Needs assistance Equipment used: Sliding board Transfers: Bed to chair/wheelchair/BSC            Lateral/Scoot Transfers: Contact guard assist, Max assist, +2 physical assistance, With slide board General transfer comment: Pt able to perform lat scoot with use of sliding board CGA but required maxA x2 for completion of task secondary to increased fatigue and reports of testicular pain    Ambulation/Gait               General Gait Details: unable   Stairs             Wheelchair Mobility     Tilt Bed    Modified Rankin (Stroke Patients Only)       Balance Overall balance assessment: Needs assistance Sitting-balance support: Bilateral upper extremity supported Sitting balance-Leahy Scale: Good Sitting balance - Comments: Pt able to maintain seated balance at EOB with BUE support without LOB noted       Standing balance comment: unable to perform                            Cognition Arousal: Alert Behavior During Therapy: [facility] for tasks assessed/performed Overall Cognitive Status: Within Functional Limits for tasks assessed  General Comments: Cooperative with PT session, although short tempered        Exercises Other Exercises Other Exercises: BLE quad sets x10ea    General Comments General comments (skin integrity, edema, etc.): Decreased L skin integrity and decreased R LE knee ext      Pertinent Vitals/Pain Pain Assessment Pain Assessment: Faces Faces Pain Scale: Hurts a little bit Pain Location: testicles Pain Intervention(s): Limited activity within patient's tolerance, Monitored during session    Home Living                          Prior Function             PT Goals (current goals can now be found in the care plan section) Acute Rehab PT Goals Patient Stated Goal: To go to rehab, then go back up to [place] PT Goal Formulation: With patient/family Time For Goal Achievement: 06/19/23 Potential to Achieve Goals: Good Progress towards PT goals: Progressing toward goals    Frequency    Min 1X/week      PT Plan      Co-evaluation              AM-PAC PT "6 Clicks" Mobility   Outcome Measure  Help needed turning from your back to your side while in a flat bed without using bedrails?: A Little Help needed moving from lying on your back to sitting on the side of a flat bed without using bedrails?: A Little Help needed moving to and from a bed to a chair (including a wheelchair)?: A Lot Help needed standing up from a chair using your arms (e.g., wheelchair or bedside chair)?: A Lot Help needed to walk in hospital room?: Total Help needed climbing 3-5 steps with a railing? : Total 6 Click Score: 12    End of Session Equipment Utilized During Treatment: Gait belt Activity Tolerance: Patient limited by pain Patient left: in chair;with chair alarm set;with call bell/phone within reach;with nursing/sitter in room Nurse Communication: Mobility status PT Visit Diagnosis: Other abnormalities of gait and mobility (R26.89);Pain;Muscle weakness (generalized) (M62.81) Pain - Right/Left: Right Pain - part of body: Leg     Time: 0951-1010 PT Time Calculation (min) (ACUTE ONLY): 19 min  Charges:                            Elmon Else, SPT    Zian Delair 06/06/2023, 12:50 PM

## 2023-06-06 NOTE — Anesthesia Postprocedure Evaluation (Signed)
Anesthesia Post Note  Patient: Allen Chavez  Procedure(s) Performed: RIGHT AMPUTATION BELOW KNEE (Right: Knee)  Patient location during evaluation: PACU Anesthesia Type: General Level of consciousness: awake and alert Pain management: pain level controlled Vital Signs Assessment: post-procedure vital signs reviewed and stable Respiratory status: spontaneous breathing, nonlabored ventilation, respiratory function stable and patient connected to nasal cannula oxygen Cardiovascular status: blood pressure returned to baseline and stable Postop Assessment: no apparent nausea or vomiting Anesthetic complications: no   No notable events documented.   Last Vitals:  Vitals:   06/05/23 1537 06/05/23 2141  BP: 138/64 (!) 158/73  Pulse: 80 92  Resp: 17 18  Temp: 37.1 C 37 C  SpO2: 96% 99%    Last Pain:  Vitals:   06/05/23 2226  TempSrc:   PainSc: Asleep                 Lenard Simmer

## 2023-06-07 DIAGNOSIS — A419 Sepsis, unspecified organism: Secondary | ICD-10-CM | POA: Diagnosis not present

## 2023-06-07 DIAGNOSIS — R6520 Severe sepsis without septic shock: Secondary | ICD-10-CM | POA: Diagnosis not present

## 2023-06-07 LAB — GLUCOSE, CAPILLARY
Glucose-Capillary: 60 mg/dL — ABNORMAL LOW (ref 70–99)
Glucose-Capillary: 94 mg/dL (ref 70–99)
Glucose-Capillary: 179 mg/dL — ABNORMAL HIGH (ref 70–99)
Glucose-Capillary: 178 mg/dL — ABNORMAL HIGH (ref 70–99)
Glucose-Capillary: 166 mg/dL — ABNORMAL HIGH (ref 70–99)

## 2023-06-07 MED ORDER — INSULIN GLARGINE-YFGN 100 UNIT/ML ~~LOC~~ SOLN
10.0000 [IU] | Freq: Every day | SUBCUTANEOUS | Status: DC
  Administered 2023-06-07: 10 [IU] via SUBCUTANEOUS
  Filled 2023-06-07 (×2): qty 0.1

## 2023-06-07 NOTE — Progress Notes (Signed)
Progress Note    Allen Chavez  ZOX:[id number] DOB: 03/09/1956  DOA: 05/28/2023 PCP: [name], MD      Brief Narrative:    Medical records reviewed and are as summarized below:  Allen Chavez is a 67 y.o. male with medical history significant of insulin-dependent diabetes mellitus, hypertension, PAD and CKD stage IIIa presented to ED with generalized malaise and worsening right foot wound with concern of gangrene and surrounding erythema with tracking up to upper legs for about 2 days prior to admission.  Patient also endorsed some subjective fever and chills at home.  He also had poor oral intake.   On presentation  Patient was afebrile, tachycardic at 112 and softer blood pressure 94/52.  Labs pertinent for leukocytosis at 18.1, absolute neutrophil 15.8, hemoglobin 11.5, lactic acid 3.7, sodium 133, CO2 21, glucose 157, BUN 45, creatinine 3.24 with GFR of 20, baseline seems to be around 1.4-1.5.  CXR with no active cardiopulmonary disease. Right foot imaging with asymmetric soft tissue swelling around fifth toe with some probable small amount of air in the soft tissue.  Also noted to have a small cortical breach in the distal phalanx, concerning for acute osteomyelitis.   EKG shows sinus tachycardia without acute ST-T changes   Patient met severe sepsis criteria and blood cultures were drawn.  Received IV fluid and started on broad-spectrum antibiotics.  Vascular surgery and podiatry was consulted.  10/31: Afebrile this morning, maximum temperature recorded of 100.5 overnight.  Lactic acidosis resolved, as some improvement in leukocytosis and hemoglobin decreased to 9.3, all cell line decreased so likely some dilutional effect.  Significantly elevated CRP at 27.3, ESR 81, A1c 7.1, TSH elevated at 12.732, checking thyroid panel.  Preliminary blood cultures negative.  MRI of right foot with concern of osteo of fifth toe, no discrete abscess.  Going for angiography with vascular  today, followed by likely transmetatarsal amputation on Friday or Saturday Hypoglycemia as patient was n.p.o., switching fluids to LR with D5  11/1: S/p angiography and stents placement in right lower extremity.  Transmetatarsal amputation scheduled for tomorrow.  Creatinine slowly improving, at 1.92 today, improving leukocytosis And stable hemoglobin.  Anemia panel consistent with anemia of chronic disease with some iron and B12 deficiency.  B12 at 177.  Ordered IM replacement and also starting on supplement.  11/2: Patient had his right transmetatarsal amputation earlier today, tolerated the procedure well. Thyroid panel with some improvement in TSH to 7, increased T3 uptake but normal T4 and T3. Will need a repeat in 3 to 4 weeks by PCP.  11/3: Vital stable, creatinine continue to slowly improve, at 1.67 today.  Persistent leukocytosis likely some reactive aliment as patient had his TMA yesterday.  Preliminary wound culture with no organism but patient was already on antibiotics.  Pending PT/OT evaluation  11/4: Podiatry is concerned about a spot at his stump, will be high risk for BKA due to decreased blood supply.  PT is recommending SNF.  Cultures are growing group C strep, antibiotics switched with Unasyn.  11/5: Patient with failing TMA flap, vascular surgery was reconsulted and patient will be going for BKA on right, likely tomorrow depending OR availability.      Assessment/Plan:   Principal Problem:   Severe sepsis (HCC) Active Problems:   AKI (acute kidney injury) (HCC)   Peripheral arterial disease (HCC)   Type 2 diabetes mellitus (HCC)   Essential (primary) hypertension   Hyperlipidemia   GERD (gastroesophageal reflux disease)  Malignant neoplasm of prostate (HCC)   Gangrene of right foot (HCC)   Necrotizing subcutaneous infection (HCC)   Elevated TSH   Normocytic anemia   Acute blood loss as cause of postoperative anemia   Nutrition Problem: Increased nutrient  needs Etiology: post-op healing  Signs/Symptoms: estimated needs   Body mass index is 22.53 kg/m.   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  S/p right foot transmetatarsal amputation on 05/31/2023.  Right foot deep wound cultures from 05/31/2023 showed rare Streptococcus group G.  No growth on blood cultures. Leukocytosis has improved.  IV Unasyn was discontinued on 06/05/2023. Continue analgesics as needed for pain. Lactic acid was 3.7 on admission.  He was treated with IV fluids and IV antibiotics.   Peripheral vascular disease: S/p percutaneous transluminal angioplasty and stent placement to right SFA and popliteal arteries on 05/29/2023 History of left BKA many years ago Follow-up with vascular surgeon   Acute blood loss anemia, normocytic anemia: Hemoglobin improved from 7.2-8.3.  S/p transfusion with 1 unit of PRBCs on 06/05/2023.   Vitamin B12 deficiency (vitamin B12 177): Continue vitamin B12 supplement. Ferritin 510, iron 15, iron saturation ratio 7, TIBC 218.   AKI on CKD stage IIIa : Improved.   Creatinine was 3.24 on admission.   Hypertension: Lisinopril on hold because of AKI    Type II DM with hyperglycemia, hypoglycemia on 06/07/2023: Glucose went down to 60.  Decrease insulin glargine from 20 units to 10 units daily.  NovoLog as needed per sliding scale. He was taking Lantus 60 units daily and NovoLog 2 to 10 units with meals at home. Hemoglobin A1c 7.7 in September 2024   Elevated TSH: Initial TSH was 12.732 on 05/28/2023.  Repeat TSH was 7.430 on 05/29/2023.  Free T4 was normal.  This could be subclinical hypothyroidism.  Repeat thyroid function tests as an outpatient in 4 to 6 weeks    Other comorbidities include hyperlipidemia, GERD  Diet Order             Diet regular Room service appropriate? Yes; Fluid consistency: Thin  Diet effective now                            Consultants: Vascular  surgeon Podiatrist  Procedures: Right BKA on 06/04/2023 Right foot transmetatarsal amputation 05/31/2023 Percutaneous transluminal angioplasty and stent placement to right lower extremity popliteal and right SFA arteries    Medications:    sodium chloride   Intravenous Once   vitamin C  500 mg Oral BID   aspirin EC  81 mg Oral Daily   atorvastatin  40 mg Oral Daily   clopidogrel  75 mg Oral Daily   Fe Fum-Vit C-Vit B12-FA  1 capsule Oral BID   fenofibrate  160 mg Oral Daily   heparin  5,000 Units Subcutaneous Q8H   insulin aspart  0-15 Units Subcutaneous TID WC   insulin aspart  0-5 Units Subcutaneous QHS   insulin aspart  5 Units Subcutaneous TID WC   insulin glargine-yfgn  10 Units Subcutaneous Daily   lactulose  30 g Oral BID   melatonin  5 mg Oral QHS   multivitamin with minerals  1 tablet Oral Daily   nutrition supplement (JUVEN)  1 packet Oral BID BM   pantoprazole  40 mg Oral Daily   polyethylene glycol  17 g Oral BID   sodium chloride flush  3 mL Intravenous Q12H  zinc sulfate  220 mg Oral Daily   Continuous Infusions:     Anti-infectives (From admission, onward)    Start     Dose/Rate Route Frequency Ordered Stop   06/02/23 1800  Ampicillin-Sulbactam (UNASYN) 3 g in sodium chloride 0.9 % 100 mL IVPB  Status:  Discontinued        3 g 200 mL/hr over 30 Minutes Intravenous Every 6 hours 06/02/23 1408 06/05/23 0740   06/01/23 1200  vancomycin (VANCOREADY) IVPB 1250 mg/250 mL  Status:  Discontinued        1,250 mg 166.7 mL/hr over 90 Minutes Intravenous Every 24 hours 06/01/23 0918 06/02/23 1408   05/31/23 1345  vancomycin (VANCOREADY) IVPB 1250 mg/250 mL        1,250 mg 166.7 mL/hr over 90 Minutes Intravenous  Once 05/31/23 1254 05/31/23 1434   05/30/23 0900  vancomycin (VANCOREADY) IVPB 1250 mg/250 mL        1,250 mg 166.7 mL/hr over 90 Minutes Intravenous  Once 05/30/23 0811 05/30/23 1135   05/30/23 0815  ceFEPIme (MAXIPIME) 2 g in sodium chloride 0.9 % 100  mL IVPB  Status:  Discontinued        2 g 200 mL/hr over 30 Minutes Intravenous Every 12 hours 05/30/23 0812 06/02/23 1408   05/29/23 1600  ceFEPIme (MAXIPIME) 2 g in sodium chloride 0.9 % 100 mL IVPB  Status:  Discontinued        2 g 200 mL/hr over 30 Minutes Intravenous Every 24 hours 05/28/23 1801 05/30/23 0812   05/29/23 1556  ceFAZolin (ANCEF) IVPB 2g/100 mL premix  Status:  Discontinued        2 g 200 mL/hr over 30 Minutes Intravenous 30 min pre-op 05/29/23 1556 05/29/23 1747   05/29/23 0800  vancomycin variable dose per unstable renal function (pharmacist dosing)  Status:  Discontinued         Does not apply See admin instructions 05/28/23 1808 06/01/23 0918   05/29/23 0600  metroNIDAZOLE (FLAGYL) IVPB 500 mg  Status:  Discontinued        500 mg 100 mL/hr over 60 Minutes Intravenous Every 12 hours 05/28/23 1746 06/02/23 1408   05/28/23 1900  vancomycin (VANCOREADY) IVPB 750 mg/150 mL        750 mg 150 mL/hr over 60 Minutes Intravenous  Once 05/28/23 1802 05/28/23 2139   05/28/23 1615  ceFEPIme (MAXIPIME) 2 g in sodium chloride 0.9 % 100 mL IVPB        2 g 200 mL/hr over 30 Minutes Intravenous  Once 05/28/23 1609 05/28/23 1715   05/28/23 1615  metroNIDAZOLE (FLAGYL) IVPB 500 mg        500 mg 100 mL/hr over 60 Minutes Intravenous  Once 05/28/23 1609 05/28/23 1829   05/28/23 1615  vancomycin (VANCOCIN) IVPB 1000 mg/200 mL premix        1,000 mg 200 mL/hr over 60 Minutes Intravenous  Once 05/28/23 1609 05/28/23 2026              Family Communication/Anticipated D/C date and plan/Code Status   DVT prophylaxis: heparin injection 5,000 Units Start: 05/28/23 1730     Code Status: Full Code  Family Communication: Plan discussed with Baldo Ash, son, at the bedside Disposition Plan: Plan to discharge to SNF   Status is: Inpatient Remains inpatient appropriate because: S/p right BKA       Subjective:   No acute events overnight.  He has no complaints.  Objective:     Vitals:  06/06/23 1557 06/07/23 0108 06/07/23 0500 06/07/23 0744  BP: 136/65 (!) 153/73  (!) 148/76  Pulse: 89 87  82  Resp: 16 20  18   Temp: 98.9 F (37.2 C) 98.8 F (37.1 C)  98.4 F (36.9 C)  TempSrc:      SpO2: 96% 97%  98%  Weight:   79.6 kg   Height:       No data found.   Intake/Output Summary (Last 24 hours) at 06/07/2023 1237 Last data filed at 06/07/2023 1146 Gross per 24 hour  Intake --  Output 2270 ml  Net -2270 ml   Filed Weights   06/05/23 0500 06/06/23 0500 06/07/23 0500  Weight: 81.2 kg 79.3 kg 79.6 kg    Exam:  GEN: NAD SKIN: Warm and dry EYES: No pallor or icterus ENT: MMM CV: RRR PULM: CTA B ABD: soft, ND, NT, +BS CNS: Sleepy but easily aroused, non focal EXT: Dressing on right BKA stump wound is clean, dry and intact.  Left BKA      Data Reviewed:   I have personally reviewed following labs and imaging studies:  Labs: Labs show the following:   Basic Metabolic Panel: Recent Labs  Lab 06/01/23 0354 06/03/23 0529 06/04/23 0514 06/05/23 0522  NA 139  --  140 135  K 3.9  --  3.5 4.1  CL 111  --  110 107  CO2 21*  --  23 22  GLUCOSE 148*  --  81 259*  BUN 39*  --  30* 31*  CREATININE 1.67* 1.51* 1.37* 1.27*  CALCIUM 8.1*  --  8.4* 7.6*   GFR Estimated Creatinine Clearance: 62.7 mL/min (A) (by C-G formula based on SCr of 1.27 mg/dL (H)). Liver Function Tests: No results for input(s): "AST", "ALT", "ALKPHOS", "BILITOT", "PROT", "ALBUMIN" in the last 168 hours.  No results for input(s): "LIPASE", "AMYLASE" in the last 168 hours. No results for input(s): "AMMONIA" in the last 168 hours. Coagulation profile No results for input(s): "INR", "PROTIME" in the last 168 hours.   CBC: Recent Labs  Lab 06/01/23 0354 06/04/23 0514 06/05/23 0522 06/06/23 0224  WBC 16.6* 16.7* 11.8* 12.2*  NEUTROABS  --   --   --  9.1*  HGB 8.5* 10.6* 7.2* 8.3*  HCT 24.5* 30.7* 20.9* 24.2*  MCV 80.9 80.6 80.1 81.8  PLT 303 465* 441* 430*    Cardiac Enzymes: No results for input(s): "CKTOTAL", "CKMB", "CKMBINDEX", "TROPONINI" in the last 168 hours. BNP (last 3 results) No results for input(s): "PROBNP" in the last 8760 hours. CBG: Recent Labs  Lab 06/06/23 1744 06/06/23 2156 06/07/23 0745 06/07/23 0917 06/07/23 1146  GLUCAP 107* 122* 60* 94 179*   D-Dimer: No results for input(s): "DDIMER" in the last 72 hours. Hgb A1c: No results for input(s): "HGBA1C" in the last 72 hours. Lipid Profile: No results for input(s): "CHOL", "HDL", "LDLCALC", "TRIG", "CHOLHDL", "LDLDIRECT" in the last 72 hours. Thyroid function studies: No results for input(s): "TSH", "T4TOTAL", "T3FREE", "THYROIDAB" in the last 72 hours.  Invalid input(s): "FREET3" Anemia work up: No results for input(s): "VITAMINB12", "FOLATE", "FERRITIN", "TIBC", "IRON", "RETICCTPCT" in the last 72 hours. Sepsis Labs: Recent Labs  Lab 06/01/23 0354 06/04/23 0514 06/05/23 0522 06/06/23 0224  WBC 16.6* 16.7* 11.8* 12.2*    Microbiology Recent Results (from the past 240 hour(s))  Culture, blood (Routine x 2)     Status: None   Collection Time: 05/28/23  3:40 PM   Specimen: BLOOD  Result Value Ref Range  Status   Specimen Description BLOOD BLOOD RIGHT ARM  Final   Special Requests   Final    BOTTLES DRAWN AEROBIC AND ANAEROBIC Blood Culture results may not be optimal due to an excessive volume of blood received in culture bottles   Culture   Final    NO GROWTH 5 DAYS Performed at [facility], [address], [place], [place] [address]    Report Status 06/02/2023 FINAL  Final  Culture, blood (Routine x 2)     Status: None   Collection Time: 05/28/23  4:30 PM   Specimen: BLOOD  Result Value Ref Range Status   Specimen Description BLOOD LEFT ANTECUBITAL  Final   Special Requests   Final    BOTTLES DRAWN AEROBIC AND ANAEROBIC Blood Culture adequate volume   Culture   Final    NO GROWTH 5 DAYS Performed at [facility], [address], [place], [place] [address]    Report Status 06/02/2023 FINAL  Final  Surgical PCR screen     Status: None   Collection Time: 05/31/23 12:00 AM   Specimen: Nasal Mucosa; Nasal Swab  Result Value Ref Range Status   MRSA, PCR NEGATIVE NEGATIVE Final   Staphylococcus aureus NEGATIVE NEGATIVE Final    Comment: (NOTE) The Xpert SA Assay (FDA approved for NASAL specimens in patients 68 years of age and older), is one component of a comprehensive surveillance program. It is not intended to diagnose infection nor to guide or monitor treatment. Performed at [facility], [address] [place], [place] [address]   Aerobic/Anaerobic Culture w Gram Stain (surgical/deep wound)     Status: None   Collection Time: 05/31/23  9:07 AM   Specimen: Wound; Tissue  Result Value Ref Range Status   Specimen Description   Final    WOUND Performed at [facility], [address], [place], [place] [address]    Special Requests   Final    NONE Performed at [facility], [address] [place], [place] [address]    Gram Stain NO WBC SEEN NO ORGANISMS SEEN   Final   Culture   Final    RARE STREPTOCOCCUS GROUP G Beta hemolytic streptococci are predictably susceptible to penicillin and other beta lactams. Susceptibility testing not routinely performed. NO ANAEROBES ISOLATED Performed at [facility], [address] [place], [place] [address]    Report Status 06/05/2023 FINAL  Final  Aerobic/Anaerobic Culture w Gram Stain (surgical/deep wound)     Status: None   Collection Time: 05/31/23  9:12 AM   Specimen: Wound; Tissue  Result Value Ref Range Status   Specimen Description   Final    WOUND Performed at [facility], [address] [place], [place] [address]    Special Requests   Final    NONE Performed at [facility], [address] [place], [place] [address]    Gram Stain NO WBC SEEN NO ORGANISMS SEEN   Final   Culture    Final    RARE STREPTOCOCCUS GROUP G Beta hemolytic streptococci are predictably susceptible to penicillin and other beta lactams. Susceptibility testing not routinely performed. NO ANAEROBES ISOLATED Performed at [facility], [address] [place], [place] [address]    Report Status 06/05/2023 FINAL  Final    Procedures and diagnostic studies:  No results found.             LOS: 10 days   Shakima Nisley  [employer] loss adjuster on [email or web address]. If 7PM-7AM, please contact night-coverage at [email or web address]     06/07/2023, 12:37 PM

## 2023-06-07 NOTE — Plan of Care (Signed)
  Problem: Fluid Volume: Goal: Ability to maintain a balanced intake and output will improve Outcome: Progressing   Problem: Metabolic: Goal: Ability to maintain appropriate glucose levels will improve Outcome: Progressing   Problem: Nutritional: Goal: Maintenance of adequate nutrition will improve Outcome: Progressing   Problem: Activity: Goal: Risk for activity intolerance will decrease Outcome: Progressing   Problem: Nutrition: Goal: Adequate nutrition will be maintained Outcome: Progressing   Problem: Coping: Goal: Level of anxiety will decrease Outcome: Progressing   Problem: Elimination: Goal: Will not experience complications related to bowel motility Outcome: Progressing   Problem: Pain Management: Goal: General experience of comfort will improve Outcome: Progressing   Problem: Nutrition: Goal: Adequate nutrition will be maintained Outcome: Progressing   Problem: Pain Management: Goal: General experience of comfort will improve Outcome: Progressing

## 2023-06-08 DIAGNOSIS — A419 Sepsis, unspecified organism: Secondary | ICD-10-CM | POA: Diagnosis not present

## 2023-06-08 DIAGNOSIS — R6520 Severe sepsis without septic shock: Secondary | ICD-10-CM | POA: Diagnosis not present

## 2023-06-08 LAB — BASIC METABOLIC PANEL
Anion gap: 4 — ABNORMAL LOW (ref 5–15)
BUN: 35 mg/dL — ABNORMAL HIGH (ref 8–23)
CO2: 24 mmol/L (ref 22–32)
Calcium: 8 mg/dL — ABNORMAL LOW (ref 8.9–10.3)
Chloride: 104 mmol/L (ref 98–111)
Creatinine, Ser: 1.39 mg/dL — ABNORMAL HIGH (ref 0.61–1.24)
GFR, Estimated: 55 mL/min — ABNORMAL LOW (ref >60)
Glucose, Bld: 222 mg/dL — ABNORMAL HIGH (ref 70–99)
Potassium: 4.2 mmol/L (ref 3.5–5.1)
Sodium: 132 mmol/L — ABNORMAL LOW (ref 135–145)

## 2023-06-08 LAB — CBC WITH DIFFERENTIAL/PLATELET
Abs Immature Granulocytes: 0.19 10*3/uL — ABNORMAL HIGH (ref 0.00–0.07)
Basophils Absolute: 0.1 10*3/uL (ref 0.0–0.1)
Basophils Relative: 0 %
Eosinophils Absolute: 0.3 10*3/uL (ref 0.0–0.5)
Eosinophils Relative: 3 %
HCT: 25.4 % — ABNORMAL LOW (ref 39.0–52.0)
Hemoglobin: 8.6 g/dL — ABNORMAL LOW (ref 13.0–17.0)
Immature Granulocytes: 2 %
Lymphocytes Relative: 12 %
Lymphs Abs: 1.3 10*3/uL (ref 0.7–4.0)
MCH: 28.3 pg (ref 26.0–34.0)
MCHC: 33.9 g/dL (ref 30.0–36.0)
MCV: 83.6 fL (ref 80.0–100.0)
Monocytes Absolute: 0.6 10*3/uL (ref 0.1–1.0)
Monocytes Relative: 5 %
Neutro Abs: 8.9 10*3/uL — ABNORMAL HIGH (ref 1.7–7.7)
Neutrophils Relative %: 78 %
Platelets: 433 10*3/uL — ABNORMAL HIGH (ref 150–400)
RBC: 3.04 MIL/uL — ABNORMAL LOW (ref 4.22–5.81)
RDW: 17 % — ABNORMAL HIGH (ref 11.5–15.5)
WBC: 11.3 10*3/uL — ABNORMAL HIGH (ref 4.0–10.5)
nRBC: 0 % (ref 0.0–0.2)

## 2023-06-08 LAB — GLUCOSE, CAPILLARY
Glucose-Capillary: 213 mg/dL — ABNORMAL HIGH (ref 70–99)
Glucose-Capillary: 193 mg/dL — ABNORMAL HIGH (ref 70–99)
Glucose-Capillary: 193 mg/dL — ABNORMAL HIGH (ref 70–99)
Glucose-Capillary: 193 mg/dL — ABNORMAL HIGH (ref 70–99)

## 2023-06-08 MED ORDER — INSULIN GLARGINE-YFGN 100 UNIT/ML ~~LOC~~ SOLN
10.0000 [IU] | Freq: Two times a day (BID) | SUBCUTANEOUS | Status: DC
  Administered 2023-06-08 – 2023-06-11 (×7): 10 [IU] via SUBCUTANEOUS
  Filled 2023-06-08 (×8): qty 0.1

## 2023-06-08 NOTE — Progress Notes (Signed)
Progress Note    Allen Chavez  PIR:[id number] DOB: Oct 05, 1955  DOA: 05/28/2023 PCP: [name], MD      Brief Narrative:    Medical records reviewed and are as summarized below:  Allen Chavez is a 67 y.o. male with medical history significant of insulin-dependent diabetes mellitus, hypertension, PAD and CKD stage IIIa presented to ED with generalized malaise and worsening right foot wound with concern of gangrene and surrounding erythema with tracking up to upper legs for about 2 days prior to admission.  Patient also endorsed some subjective fever and chills at home.  He also had poor oral intake.   On presentation  Patient was afebrile, tachycardic at 112 and softer blood pressure 94/52.  Labs pertinent for leukocytosis at 18.1, absolute neutrophil 15.8, hemoglobin 11.5, lactic acid 3.7, sodium 133, CO2 21, glucose 157, BUN 45, creatinine 3.24 with GFR of 20, baseline seems to be around 1.4-1.5.  CXR with no active cardiopulmonary disease. Right foot imaging with asymmetric soft tissue swelling around fifth toe with some probable small amount of air in the soft tissue.  Also noted to have a small cortical breach in the distal phalanx, concerning for acute osteomyelitis.   EKG shows sinus tachycardia without acute ST-T changes   Patient met severe sepsis criteria and blood cultures were drawn.  Received IV fluid and started on broad-spectrum antibiotics.  Vascular surgery and podiatry was consulted.  10/31: Afebrile this morning, maximum temperature recorded of 100.5 overnight.  Lactic acidosis resolved, as some improvement in leukocytosis and hemoglobin decreased to 9.3, all cell line decreased so likely some dilutional effect.  Significantly elevated CRP at 27.3, ESR 81, A1c 7.1, TSH elevated at 12.732, checking thyroid panel.  Preliminary blood cultures negative.  MRI of right foot with concern of osteo of fifth toe, no discrete abscess.  Going for angiography with vascular  today, followed by likely transmetatarsal amputation on Friday or Saturday Hypoglycemia as patient was n.p.o., switching fluids to LR with D5  11/1: S/p angiography and stents placement in right lower extremity.  Transmetatarsal amputation scheduled for tomorrow.  Creatinine slowly improving, at 1.92 today, improving leukocytosis And stable hemoglobin.  Anemia panel consistent with anemia of chronic disease with some iron and B12 deficiency.  B12 at 177.  Ordered IM replacement and also starting on supplement.  11/2: Patient had his right transmetatarsal amputation earlier today, tolerated the procedure well. Thyroid panel with some improvement in TSH to 7, increased T3 uptake but normal T4 and T3. Will need a repeat in 3 to 4 weeks by PCP.  11/3: Vital stable, creatinine continue to slowly improve, at 1.67 today.  Persistent leukocytosis likely some reactive aliment as patient had his TMA yesterday.  Preliminary wound culture with no organism but patient was already on antibiotics.  Pending PT/OT evaluation  11/4: Podiatry is concerned about a spot at his stump, will be high risk for BKA due to decreased blood supply.  PT is recommending SNF.  Cultures are growing group C strep, antibiotics switched with Unasyn.  11/5: Patient with failing TMA flap, vascular surgery was reconsulted and patient will be going for BKA on right, likely tomorrow depending OR availability.      Assessment/Plan:   Principal Problem:   Severe sepsis (HCC) Active Problems:   AKI (acute kidney injury) (HCC)   Peripheral arterial disease (HCC)   Type 2 diabetes mellitus (HCC)   Essential (primary) hypertension   Hyperlipidemia   GERD (gastroesophageal reflux disease)  Malignant neoplasm of prostate (HCC)   Gangrene of right foot (HCC)   Necrotizing subcutaneous infection (HCC)   Elevated TSH   Normocytic anemia   Acute blood loss as cause of postoperative anemia   Nutrition Problem: Increased nutrient  needs Etiology: post-op healing  Signs/Symptoms: estimated needs   Body mass index is 22.53 kg/m.   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  S/p right foot transmetatarsal amputation on 05/31/2023.  Right foot deep wound cultures from 05/31/2023 showed rare Streptococcus group G.  No growth on blood cultures. Leukocytosis has improved.  IV Unasyn was discontinued on 06/05/2023. Continue analgesics as needed for pain. Lactic acid was 3.7 on admission.  He was treated with IV fluids and IV antibiotics.   Peripheral vascular disease: S/p percutaneous transluminal angioplasty and stent placement to right SFA and popliteal arteries on 05/29/2023 History of left BKA many years ago Continue aspirin and Plavix. Follow-up with vascular surgeon   Acute blood loss anemia, normocytic anemia: Hemoglobin improved from 7.2-8.3.  S/p transfusion with 1 unit of PRBCs on 06/05/2023.   Vitamin B12 deficiency (vitamin B12 177): Continue vitamin B12 supplement. Ferritin 510, iron 15, iron saturation ratio 7, TIBC 218.   AKI on CKD stage IIIa : Improved.   Creatinine was 3.24 on admission.   Hypertension: BP is okay.  Lisinopril on hold because of AKI    Type II DM with hyperglycemia, s/p hypoglycemia on 06/07/2023: Glucose is trending upward.  Increase insulin glargine from 10 units daily to 10 units twice daily.  NovoLog as needed per sliding scale. He was taking Lantus 60 units daily and NovoLog 2 to 10 units with meals at home. Hemoglobin A1c 7.7 in September 2024   Elevated TSH: Initial TSH was 12.732 on 05/28/2023.  Repeat TSH was 7.430 on 05/29/2023.  Free T4 was normal.  This could be subclinical hypothyroidism.  Repeat thyroid function tests as an outpatient in 4 to 6 weeks    Other comorbidities include hyperlipidemia, GERD  Diet Order             Diet regular Room service appropriate? Yes; Fluid consistency: Thin  Diet effective now                             Consultants: Vascular surgeon Podiatrist  Procedures: Right BKA on 06/04/2023 Right foot transmetatarsal amputation 05/31/2023 Percutaneous transluminal angioplasty and stent placement to right lower extremity popliteal and right SFA arteries    Medications:    sodium chloride   Intravenous Once   vitamin C  500 mg Oral BID   aspirin EC  81 mg Oral Daily   atorvastatin  40 mg Oral Daily   clopidogrel  75 mg Oral Daily   Fe Fum-Vit C-Vit B12-FA  1 capsule Oral BID   fenofibrate  160 mg Oral Daily   heparin  5,000 Units Subcutaneous Q8H   insulin aspart  0-15 Units Subcutaneous TID WC   insulin aspart  0-5 Units Subcutaneous QHS   insulin aspart  5 Units Subcutaneous TID WC   insulin glargine-yfgn  10 Units Subcutaneous BID   lactulose  30 g Oral BID   melatonin  5 mg Oral QHS   multivitamin with minerals  1 tablet Oral Daily   nutrition supplement (JUVEN)  1 packet Oral BID BM   pantoprazole  40 mg Oral Daily   polyethylene glycol  17 g Oral BID  sodium chloride flush  3 mL Intravenous Q12H   zinc sulfate  220 mg Oral Daily   Continuous Infusions:     Anti-infectives (From admission, onward)    Start     Dose/Rate Route Frequency Ordered Stop   06/02/23 1800  Ampicillin-Sulbactam (UNASYN) 3 g in sodium chloride 0.9 % 100 mL IVPB  Status:  Discontinued        3 g 200 mL/hr over 30 Minutes Intravenous Every 6 hours 06/02/23 1408 06/05/23 0740   06/01/23 1200  vancomycin (VANCOREADY) IVPB 1250 mg/250 mL  Status:  Discontinued        1,250 mg 166.7 mL/hr over 90 Minutes Intravenous Every 24 hours 06/01/23 0918 06/02/23 1408   05/31/23 1345  vancomycin (VANCOREADY) IVPB 1250 mg/250 mL        1,250 mg 166.7 mL/hr over 90 Minutes Intravenous  Once 05/31/23 1254 05/31/23 1434   05/30/23 0900  vancomycin (VANCOREADY) IVPB 1250 mg/250 mL        1,250 mg 166.7 mL/hr over 90 Minutes Intravenous  Once 05/30/23 0811 05/30/23 1135   05/30/23 0815  ceFEPIme  (MAXIPIME) 2 g in sodium chloride 0.9 % 100 mL IVPB  Status:  Discontinued        2 g 200 mL/hr over 30 Minutes Intravenous Every 12 hours 05/30/23 0812 06/02/23 1408   05/29/23 1600  ceFEPIme (MAXIPIME) 2 g in sodium chloride 0.9 % 100 mL IVPB  Status:  Discontinued        2 g 200 mL/hr over 30 Minutes Intravenous Every 24 hours 05/28/23 1801 05/30/23 0812   05/29/23 1556  ceFAZolin (ANCEF) IVPB 2g/100 mL premix  Status:  Discontinued        2 g 200 mL/hr over 30 Minutes Intravenous 30 min pre-op 05/29/23 1556 05/29/23 1747   05/29/23 0800  vancomycin variable dose per unstable renal function (pharmacist dosing)  Status:  Discontinued         Does not apply See admin instructions 05/28/23 1808 06/01/23 0918   05/29/23 0600  metroNIDAZOLE (FLAGYL) IVPB 500 mg  Status:  Discontinued        500 mg 100 mL/hr over 60 Minutes Intravenous Every 12 hours 05/28/23 1746 06/02/23 1408   05/28/23 1900  vancomycin (VANCOREADY) IVPB 750 mg/150 mL        750 mg 150 mL/hr over 60 Minutes Intravenous  Once 05/28/23 1802 05/28/23 2139   05/28/23 1615  ceFEPIme (MAXIPIME) 2 g in sodium chloride 0.9 % 100 mL IVPB        2 g 200 mL/hr over 30 Minutes Intravenous  Once 05/28/23 1609 05/28/23 1715   05/28/23 1615  metroNIDAZOLE (FLAGYL) IVPB 500 mg        500 mg 100 mL/hr over 60 Minutes Intravenous  Once 05/28/23 1609 05/28/23 1829   05/28/23 1615  vancomycin (VANCOCIN) IVPB 1000 mg/200 mL premix        1,000 mg 200 mL/hr over 60 Minutes Intravenous  Once 05/28/23 1609 05/28/23 2026              Family Communication/Anticipated D/C date and plan/Code Status   DVT prophylaxis: heparin injection 5,000 Units Start: 05/28/23 1730     Code Status: Full Code  Family Communication: None Disposition Plan: Plan to discharge to SNF   Status is: Inpatient Remains inpatient appropriate because: S/p right BKA       Subjective:   Interval events noted.  No shortness of breath or chest pain.  He still has some pain in the right stump wound.  Objective:    Vitals:   06/07/23 0744 06/07/23 1559 06/08/23 0102 06/08/23 0723  BP: (!) 148/76 (!) 159/71 132/67 119/74  Pulse: 82 86 72 82  Resp: 18 16 16 18   Temp: 98.4 F (36.9 C) 98 F (36.7 C) 98.4 F (36.9 C) 98.4 F (36.9 C)  TempSrc:   Oral   SpO2: 98% 97% 95% 96%  Weight:      Height:       No data found.   Intake/Output Summary (Last 24 hours) at 06/08/2023 1406 Last data filed at 06/08/2023 1332 Gross per 24 hour  Intake 240 ml  Output 1650 ml  Net -1410 ml   Filed Weights   06/05/23 0500 06/06/23 0500 06/07/23 0500  Weight: 81.2 kg 79.3 kg 79.6 kg    Exam:  GEN: NAD SKIN: Warm and dry EYES: No pallor or icterus ENT: MMM CV: RRR PULM: CTA B ABD: soft, ND, NT, +BS CNS: AAO x 3, non focal EXT: Right BKA stump dressing looks clean, dry and intact.  Old left BKA.     Data Reviewed:   I have personally reviewed following labs and imaging studies:  Labs: Labs show the following:   Basic Metabolic Panel: Recent Labs  Lab 06/03/23 0529 06/04/23 0514 06/04/23 0514 06/05/23 0522 06/08/23 0532  NA  --  140  --  135 132*  K  --  3.5   < > 4.1 4.2  CL  --  110  --  107 104  CO2  --  23  --  22 24  GLUCOSE  --  81  --  259* 222*  BUN  --  30*  --  31* 35*  CREATININE 1.51* 1.37*  --  1.27* 1.39*  CALCIUM  --  8.4*  --  7.6* 8.0*   < > = values in this interval not displayed.   GFR Estimated Creatinine Clearance: 57.3 mL/min (A) (by C-G formula based on SCr of 1.39 mg/dL (H)). Liver Function Tests: No results for input(s): "AST", "ALT", "ALKPHOS", "BILITOT", "PROT", "ALBUMIN" in the last 168 hours.  No results for input(s): "LIPASE", "AMYLASE" in the last 168 hours. No results for input(s): "AMMONIA" in the last 168 hours. Coagulation profile No results for input(s): "INR", "PROTIME" in the last 168 hours.   CBC: Recent Labs  Lab 06/04/23 0514 06/05/23 0522 06/06/23 0224  06/08/23 0532  WBC 16.7* 11.8* 12.2* 11.3*  NEUTROABS  --   --  9.1* 8.9*  HGB 10.6* 7.2* 8.3* 8.6*  HCT 30.7* 20.9* 24.2* 25.4*  MCV 80.6 80.1 81.8 83.6  PLT 465* 441* 430* 433*   Cardiac Enzymes: No results for input(s): "CKTOTAL", "CKMB", "CKMBINDEX", "TROPONINI" in the last 168 hours. BNP (last 3 results) No results for input(s): "PROBNP" in the last 8760 hours. CBG: Recent Labs  Lab 06/07/23 1146 06/07/23 1732 06/07/23 2141 06/08/23 0724 06/08/23 1128  GLUCAP 179* 178* 166* 213* 193*   D-Dimer: No results for input(s): "DDIMER" in the last 72 hours. Hgb A1c: No results for input(s): "HGBA1C" in the last 72 hours. Lipid Profile: No results for input(s): "CHOL", "HDL", "LDLCALC", "TRIG", "CHOLHDL", "LDLDIRECT" in the last 72 hours. Thyroid function studies: No results for input(s): "TSH", "T4TOTAL", "T3FREE", "THYROIDAB" in the last 72 hours.  Invalid input(s): "FREET3" Anemia work up: No results for input(s): "VITAMINB12", "FOLATE", "FERRITIN", "TIBC", "IRON", "RETICCTPCT" in the last 72 hours. Sepsis Labs: Recent Labs  Lab  06/04/23 0514 06/05/23 0522 06/06/23 0224 06/08/23 0532  WBC 16.7* 11.8* 12.2* 11.3*    Microbiology Recent Results (from the past 240 hour(s))  Surgical PCR screen     Status: None   Collection Time: 05/31/23 12:00 AM   Specimen: Nasal Mucosa; Nasal Swab  Result Value Ref Range Status   MRSA, PCR NEGATIVE NEGATIVE Final   Staphylococcus aureus NEGATIVE NEGATIVE Final    Comment: (NOTE) The Xpert SA Assay (FDA approved for NASAL specimens in patients 59 years of age and older), is one component of a comprehensive surveillance program. It is not intended to diagnose infection nor to guide or monitor treatment. Performed at [facility], [address] [place], [place] [address]   Aerobic/Anaerobic Culture w Gram Stain (surgical/deep wound)     Status: None   Collection Time: 05/31/23  9:07 AM   Specimen: Wound; Tissue   Result Value Ref Range Status   Specimen Description   Final    WOUND Performed at [facility], [address] [place], [place] [address]    Special Requests   Final    NONE Performed at [facility], [address] [place], [place] [address]    Gram Stain NO WBC SEEN NO ORGANISMS SEEN   Final   Culture   Final    RARE STREPTOCOCCUS GROUP G Beta hemolytic streptococci are predictably susceptible to penicillin and other beta lactams. Susceptibility testing not routinely performed. NO ANAEROBES ISOLATED Performed at [facility], [address], [place], [place] [address]    Report Status 06/05/2023 FINAL  Final  Aerobic/Anaerobic Culture w Gram Stain (surgical/deep wound)     Status: None   Collection Time: 05/31/23  9:12 AM   Specimen: Wound; Tissue  Result Value Ref Range Status   Specimen Description   Final    WOUND Performed at [facility], [address], [place], [place] [address]    Special Requests   Final    NONE Performed at [facility], [address] [place], [place] [address]    Gram Stain NO WBC SEEN NO ORGANISMS SEEN   Final   Culture   Final    RARE STREPTOCOCCUS GROUP G Beta hemolytic streptococci are predictably susceptible to penicillin and other beta lactams. Susceptibility testing not routinely performed. NO ANAEROBES ISOLATED Performed at [facility], [address] [place], [place] [address]    Report Status 06/05/2023 FINAL  Final    Procedures and diagnostic studies:  No results found.             LOS: 11 days   Mili Piltz  [employer]ered loss adjuster on [email or web address]. If 7PM-7AM, please contact night-coverage at [email or web address]     06/08/2023, 2:06 PM

## 2023-06-08 NOTE — Plan of Care (Signed)
Pt was seen more lethargic with delayed response during bedside report. Pain assessment done was done several times during the shift. Pt pain level was 4 in all assessments and denied any medicinal/chemical intervention. RN will continue to monitor patient for possible hospital delirium. Problem: Education: Goal: Ability to describe self-care measures that may prevent or decrease complications (Diabetes Survival Skills Education) will improve Outcome: Progressing Goal: Individualized Educational Video(s) Outcome: Progressing   Problem: Coping: Goal: Ability to adjust to condition or change in health will improve Outcome: Progressing   Problem: Fluid Volume: Goal: Ability to maintain a balanced intake and output will improve Outcome: Progressing   Problem: Health Behavior/Discharge Planning: Goal: Ability to identify and utilize available resources and services will improve Outcome: Progressing Goal: Ability to manage health-related needs will improve Outcome: Progressing   Problem: Metabolic: Goal: Ability to maintain appropriate glucose levels will improve Outcome: Progressing   Problem: Nutritional: Goal: Maintenance of adequate nutrition will improve Outcome: Progressing Goal: Progress toward achieving an optimal weight will improve Outcome: Progressing   Problem: Skin Integrity: Goal: Risk for impaired skin integrity will decrease Outcome: Progressing

## 2023-06-09 DIAGNOSIS — R6520 Severe sepsis without septic shock: Secondary | ICD-10-CM | POA: Diagnosis not present

## 2023-06-09 DIAGNOSIS — A419 Sepsis, unspecified organism: Secondary | ICD-10-CM | POA: Diagnosis not present

## 2023-06-09 LAB — GLUCOSE, CAPILLARY
Glucose-Capillary: 180 mg/dL — ABNORMAL HIGH (ref 70–99)
Glucose-Capillary: 199 mg/dL — ABNORMAL HIGH (ref 70–99)
Glucose-Capillary: 212 mg/dL — ABNORMAL HIGH (ref 70–99)
Glucose-Capillary: 149 mg/dL — ABNORMAL HIGH (ref 70–99)

## 2023-06-09 MED ORDER — HYDROMORPHONE HCL 1 MG/ML IJ SOLN
0.5000 mg | INTRAMUSCULAR | Status: AC | PRN
  Administered 2023-06-09 (×2): 0.5 mg via INTRAVENOUS
  Filled 2023-06-09 (×2): qty 0.5

## 2023-06-09 NOTE — Plan of Care (Signed)

## 2023-06-09 NOTE — Progress Notes (Addendum)
Progress Note    06/09/2023 3:09 PM 5 Days Post-Op  Subjective:   Allen Chavez is a 67 yo male now POD #5 from Right BKA.  Patient is recovering as expected.  Patient is resting comfortably in bed this afternoon eating lunch.  Patient does endorse some pain at the site.  Dressing remains clean dry and intact.  No hematoma seroma.  No complaints overnight.  Vitals all remained stable.    Vitals:   06/08/23 1526 06/09/23 0822  BP: (!) 128/58 139/65  Pulse: 83 81  Resp: 16 18  Temp: 97.9 F (36.6 C) 98.1 F (36.7 C)  SpO2: 96% 95%   Physical Exam: Cardiac:  RRR, S1, S2 no murmurs appreciated Lungs: Lungs clear on auscultation.  No rales rhonchi or wheezing noted. Incisions: Right lower extremity BKA with dressing applied.  Clean dry and intact.  No drainage noted. Extremities: Bilateral BKA's.  Right BKA from 06/04/2023. Dressing changed today. No S&S of hematoma, seroma or infection. Left BKA prior Abdomen: Positive bowel sounds throughout, soft, nontender nondistended. Neurologic: AAOX4, answers questions and follows commands appropriately.  CBC    Component Value Date/Time   WBC 11.3 (H) 06/08/2023 0532   RBC 3.04 (L) 06/08/2023 0532   HGB 8.6 (L) 06/08/2023 0532   HGB 11.5 (L) 11/15/2014 0508   HCT 25.4 (L) 06/08/2023 0532   HCT 34.5 (L) 11/15/2014 0508   PLT 433 (H) 06/08/2023 0532   PLT 173 11/15/2014 0508   MCV 83.6 06/08/2023 0532   MCV 87 11/15/2014 0508   MCH 28.3 06/08/2023 0532   MCHC 33.9 06/08/2023 0532   RDW 17.0 (H) 06/08/2023 0532   RDW 14.0 11/15/2014 0508   LYMPHSABS 1.3 06/08/2023 0532   LYMPHSABS 1.4 11/15/2014 0508   MONOABS 0.6 06/08/2023 0532   MONOABS 0.6 11/15/2014 0508   EOSABS 0.3 06/08/2023 0532   EOSABS 0.0 11/15/2014 0508   BASOSABS 0.1 06/08/2023 0532   BASOSABS 0.1 11/15/2014 0508    BMET    Component Value Date/Time   NA 132 (L) 06/08/2023 0532   NA 135 11/15/2014 0508   K 4.2 06/08/2023 0532   K 4.1 11/15/2014 0508   CL  104 06/08/2023 0532   CL 108 11/15/2014 0508   CO2 24 06/08/2023 0532   CO2 26 11/15/2014 0508   GLUCOSE 222 (H) 06/08/2023 0532   GLUCOSE 167 (H) 11/15/2014 0508   BUN 35 (H) 06/08/2023 0532   BUN 26 (H) 11/15/2014 0508   CREATININE 1.39 (H) 06/08/2023 0532   CREATININE 0.91 11/15/2014 0508   CALCIUM 8.0 (L) 06/08/2023 0532   CALCIUM 8.0 (L) 11/15/2014 0508   GFRNONAA 55 (L) 06/08/2023 0532   GFRNONAA >60 11/15/2014 0508   GFRAA >60 11/15/2014 0508    INR    Component Value Date/Time   INR 1.3 (H) 05/28/2023 1535   INR 0.9 11/07/2014 0927     Intake/Output Summary (Last 24 hours) at 06/09/2023 1509 Last data filed at 06/09/2023 0900 Gross per 24 hour  Intake 240 ml  Output 1450 ml  Net -1210 ml     Assessment/Plan:  67 y.o. male is s/p  right below the knee amputation  5 Days Post-Op   PLAN Per Vascular Surgery Okay for patient to discharge when medically appropriate.  Advance diet as tolerated. Pain medication as needed. PT OT eval.  Stretching Exercises Q 1 Hr while awake to right BKA to prevent contracture.  Dressing changed today with difficulties or complications to note.  Recovering as expected.   DVT prophylaxis:  ASA 81 mg daily, Plavix 75 mg daily.    Marcie [facility] 06/09/2023 3:09 PM

## 2023-06-09 NOTE — Progress Notes (Signed)
Physical Therapy Treatment Patient Details Name: Allen Chavez MRN: [id number] DOB: 1955-11-03 Today's Date: 06/09/2023   History of Present Illness Allen Chavez is a 67 y.o. male with medical history significant of insulin-dependent diabetes mellitus, hypertension, PAD and CKD stage IIIa presented to ED with generalized malaise and worsening right foot wound with concern of gangrene and surrounding erythema with tracking up to upper legs for the past 2 days.  Patient also endorsed some subjective fever and chills at home.  He was having generalized malaise and poor p.o. intake for the past couple of days. Pt is now R BKA.    PT Comments  Patient is agreeable to PT session. Transfer training continued today. Patient needs +2 person assistance for incremental lateral scooting from bed to chair and back to bed. Increased assistance required with transition from chair to bed. Encouraged patient to try using the transfer board for safety, however he declined secondary to buttock pain. The patient is currently requiring assistance with all mobility and will  be unable to return home at this time. Recommend rehabilitation <3 hours/day after this hospital stay. PT will continue to follow.    If plan is discharge home, recommend the following: A little help with bathing/dressing/bathroom;A little help with walking and/or transfers;Assist for transportation;Help with stairs or ramp for entrance   Can travel by private vehicle     No  Equipment Recommendations  Other (comment) (to be determined at next level of care)    Recommendations for Other Services       Precautions / Restrictions Precautions Precautions: Fall Restrictions Weight Bearing Restrictions: Yes RLE Weight Bearing: Non weight bearing Other Position/Activity Restrictions: L skin tear cannot don prosthesis, conservative NWB while wound is healing     Mobility  Bed Mobility Overal bed mobility: Needs Assistance Bed Mobility: Supine  to Sit, Sit to Supine     Supine to sit: Mod assist Sit to supine: Mod assist, +2 for physical assistance   General bed mobility comments: assistance for trunk and BLE support. cues for technique. intermittent +2 person assistance requried secondary to buttock pain. sheets were wet with urine and skin is red around the buttock area    Transfers Overall transfer level: Needs assistance Equipment used: None Transfers: Bed to chair/wheelchair/BSC            Lateral/Scoot Transfers: Mod assist, Max assist, +2 physical assistance General transfer comment: transfer performed from bed to recliner with drop arm and back to bed. encouraged patient to use the transfer board for safety. he declined and wanted to try incremental scooting. encouraged patient to off load buttocks with incremental scooting to avoid shearing for skin integrity. patient required intermittent increased assistance with going "up hill" from bed to chair.    Ambulation/Gait               General Gait Details: unable to at this time as patient is unable to don prosthesis on the left secondary to wound on the residual limb   Stairs             Wheelchair Mobility     Tilt Bed    Modified Rankin (Stroke Patients Only)       Balance Overall balance assessment: Needs assistance Sitting-balance support: Bilateral upper extremity supported Sitting balance-Leahy Scale: Fair Sitting balance - Comments: close stand by assistance with cues to avoid leaning too far anteriorly for fall prevention  Cognition Arousal: Alert Behavior During Therapy: WFL for tasks assessed/performed Overall Cognitive Status: Within Functional Limits for tasks assessed                                 General Comments: decreased safety awareness and awareness of need for physical assistance        Exercises      General Comments General comments (skin  integrity, edema, etc.): education patient on importance of repositioning to promote right knee extension      Pertinent Vitals/Pain Pain Assessment Pain Assessment: 0-10 Pain Score: 9  Pain Location: buttocks, testicles Pain Descriptors / Indicators: Discomfort, Sore Pain Intervention(s): Limited activity within patient's tolerance, Monitored during session, Repositioned    Home Living                          Prior Function            PT Goals (current goals can now be found in the care plan section) Acute Rehab PT Goals Patient Stated Goal: to get to rehab PT Goal Formulation: With patient/family Time For Goal Achievement: 06/19/23 Potential to Achieve Goals: Good Progress towards PT goals: Progressing toward goals    Frequency    Min 1X/week      PT Plan      Co-evaluation PT/OT/SLP Co-Evaluation/Treatment: Yes Reason for Co-Treatment: For patient/therapist safety;To address functional/ADL transfers PT goals addressed during session: Mobility/safety with mobility;Balance        AM-PAC PT "6 Clicks" Mobility   Outcome Measure  Help needed turning from your back to your side while in a flat bed without using bedrails?: A Little Help needed moving from lying on your back to sitting on the side of a flat bed without using bedrails?: A Lot Help needed moving to and from a bed to a chair (including a wheelchair)?: A Lot Help needed standing up from a chair using your arms (e.g., wheelchair or bedside chair)?: A Lot Help needed to walk in hospital room?: Total Help needed climbing 3-5 steps with a railing? : Total 6 Click Score: 11    End of Session   Activity Tolerance: Patient tolerated treatment well Patient left: in bed;with call bell/phone within reach;with bed alarm set Nurse Communication: Mobility status;Patient requests pain meds (nuse in the room for part of sesssion) PT Visit Diagnosis: Other abnormalities of gait and mobility  (R26.89);Pain;Muscle weakness (generalized) (M62.81)     Time: 4696-2952 PT Time Calculation (min) (ACUTE ONLY): 33 min  Charges:    $Therapeutic Activity: 8-22 mins PT General Charges $$ ACUTE PT VISIT: 1 Visit                     Donna Bernard, PT, MPT   Ina Homes 06/09/2023, 10:50 AM

## 2023-06-09 NOTE — Progress Notes (Signed)
Refused midnight VS check

## 2023-06-09 NOTE — Progress Notes (Signed)
Occupational Therapy Treatment Patient Details Name: Allen Chavez MRN: [id number] DOB: 06-24-1956 Today's Date: 06/09/2023   History of present illness Allen Chavez is a 67 y.o. male with medical history significant of insulin-dependent diabetes mellitus, hypertension, PAD and CKD stage IIIa presented to ED with generalized malaise and worsening right foot wound with concern of gangrene and surrounding erythema with tracking up to upper legs for the past 2 days.  Patient also endorsed some subjective fever and chills at home.  He was having generalized malaise and poor p.o. intake for the past couple of days. Pt is now R BKA.   OT comments  Pt seen for OT tx and co-tx with PT to optimize safety and success with ADL transfer training. Pt denies pain at start of session, however, endorsing significant buttock and testicle pain during and at end of session 2/2 linens saturated with urine leading to redness and irritation. RN notified during session. Pt required +1-2 assist for bed mobility (increased assist required with more pain) and MOD-MAX A +2 assist for lateral scoot transfers from EOB to/from recliner with MAX VC for sequencing and hand placement. Pt refused slide board. Pt progressing towards goals with noted improvement in tolerance and ADL mobility this date. Continues to benefit from skilled OT services. Unsafe to return home at this time and will benefit from higher intensity skilled therapy to optimize his safety/indep.       If plan is discharge home, recommend the following:  Direct supervision/assist for medications management;Direct supervision/assist for financial management;Assist for transportation;Help with stairs or ramp for entrance;A little help with bathing/dressing/bathroom;Two people to help with walking and/or transfers   Equipment Recommendations  Other (comment) (defer, will likely need w/c, slide board, drop arm BSC)    Recommendations for Other Services      Precautions  / Restrictions Precautions Precautions: Fall Restrictions Weight Bearing Restrictions: Yes RLE Weight Bearing: Non weight bearing LLE Weight Bearing: Non weight bearing Other Position/Activity Restrictions: L skin tear cannot don prosthesis, conservative NWB while wound is healing       Mobility Bed Mobility Overal bed mobility: Needs Assistance Bed Mobility: Supine to Sit, Sit to Supine     Supine to sit: Mod assist Sit to supine: Mod assist, +2 for physical assistance   General bed mobility comments: assistance for trunk and BLE support. cues for technique. intermittent +2 person assistance requried secondary to buttock pain. sheets were wet with urine and skin is red around the buttock area    Transfers Overall transfer level: Needs assistance Equipment used: None Transfers: Bed to chair/wheelchair/BSC            Lateral/Scoot Transfers: Mod assist, Max assist, +2 physical assistance General transfer comment: transfer performed from bed to recliner with drop arm and back to bed. encouraged patient to use the transfer board for safety. he declined and wanted to try incremental scooting. encouraged patient to off load buttocks with incremental scooting to avoid shearing for skin integrity. patient required intermittent increased assistance with going "up hill" from bed to chair.     Balance Overall balance assessment: Needs assistance Sitting-balance support: Bilateral upper extremity supported, Single extremity supported, Feet unsupported Sitting balance-Leahy Scale: Fair                                     ADL either performed or assessed with clinical judgement   ADL Overall ADL's : Needs assistance/impaired  Grooming: Sitting;Set up;Wash/dry face Grooming Details (indicate cue type and reason): fair static sitting balance EOB         Upper Body Dressing : Sitting;Minimal assistance Upper Body Dressing Details (indicate cue type and reason):  don gown         Toileting- Clothing Manipulation and Hygiene: Sitting/lateral lean;Supervision/safety;Set up Toileting - Clothing Manipulation Details (indicate cue type and reason): Supv for safety/balance while pt used urinal from seated position in recliner, requiring intermittent UE support on arm rest.     Functional mobility during ADLs: +2 for physical assistance;Maximal assistance;Moderate assistance;Cueing for safety;Cueing for sequencing      Extremity/Trunk Assessment              Vision       Perception     Praxis      Cognition Arousal: Alert Behavior During Therapy: WFL for tasks assessed/performed Overall Cognitive Status: Within Functional Limits for tasks assessed                                 General Comments: decreased safety awareness and awareness of need for physical assistance        Exercises Other Exercises Other Exercises: OT reiterated previous R knee positioning and importance of knee extension for future prosthesis use. Pt unable to fully straighten R knee at this time.    Shoulder Instructions       General Comments education patient on importance of repositioning to promote right knee extension    Pertinent Vitals/ Pain       Pain Assessment Pain Assessment: 0-10 Pain Score: 9  Pain Location: buttocks, testicles Pain Descriptors / Indicators: Discomfort, Sore Pain Intervention(s): Limited activity within patient's tolerance, Monitored during session, Repositioned, Patient requesting pain meds-RN notified  Home Living                                          Prior Functioning/Environment              Frequency  Min 1X/week        Progress Toward Goals  OT Goals(current goals can now be found in the care plan section)  Progress towards OT goals: Progressing toward goals  Acute Rehab OT Goals Patient Stated Goal: go to rehab to get stronger and try to find housing OT Goal  Formulation: With patient/family Time For Goal Achievement: 06/19/23 Potential to Achieve Goals: Good  Plan      Co-evaluation    PT/OT/SLP Co-Evaluation/Treatment: Yes Reason for Co-Treatment: For patient/therapist safety;To address functional/ADL transfers PT goals addressed during session: Mobility/safety with mobility;Balance OT goals addressed during session: ADL's and self-care      AM-PAC OT "6 Clicks" Daily Activity     Outcome Measure   Help from another person eating meals?: None Help from another person taking care of personal grooming?: A Little Help from another person toileting, which includes using toliet, bedpan, or urinal?: A Little Help from another person bathing (including washing, rinsing, drying)?: A Lot Help from another person to put on and taking off regular upper body clothing?: A Little Help from another person to put on and taking off regular lower body clothing?: A Little 6 Click Score: 18    End of Session Equipment Utilized During Treatment: Gait belt  OT Visit Diagnosis: Unsteadiness on feet (R26.81);Muscle  weakness (generalized) (M62.81);Other abnormalities of gait and mobility (R26.89)   Activity Tolerance Patient tolerated treatment well;Patient limited by pain   Patient Left in bed;with call bell/phone within reach;with bed alarm set   Nurse Communication Mobility status;Patient requests pain meds;Other (comment) (buttocks/testicles skin breakdown, pt wants a bath)        Time: 4742-5956 OT Time Calculation (min): 32 min  Charges: OT General Charges $OT Visit: 1 Visit OT Treatments $Self Care/Home Management : 8-22 mins  Arman Filter., MPH, MS, OTR/L ascom [phone] 06/09/23, 11:17 AM

## 2023-06-09 NOTE — TOC Progression Note (Signed)
Transition of Care [facility]) - Progression Note    Patient Details  Name: Allen Chavez MRN: [id number] Date of Birth: Mar 09, 1956  [facility] CM/SW Contact  Marlowe Sax, RN Phone Number: 06/09/2023, 9:24 AM  Clinical Narrative:     Received a request from [facility] asking why the patient needs STR, I sent back explaining that he will need Therapy to learn to transfer without the use of his legs, also to build strength in upper body to be able to go home and be independent, awaiting ins approval  Expected Discharge Plan: Skilled Nursing Facility Barriers to Discharge: SNF Pending bed offer, Insurance Authorization  Expected Discharge Plan and Services In-house Referral: Clinical Social Work Discharge Planning Services: Other - See comment (Pending)   Living arrangements for the past 2 months: Single Family Home                 DME Arranged: N/A DME Agency: NA       HH Arranged: NA (Pending) HH Agency: NA (Pending)         Social Determinants of Health (SDOH) Interventions SDOH Screenings   Food Insecurity: No Food Insecurity (05/28/2023)  Housing: Low Risk  (05/28/2023)  Transportation Needs: No Transportation Needs (05/28/2023)  Utilities: Not At Risk (05/28/2023)  Financial Resource Strain: Patient Declined (09/30/2022)   Received from [facility], [facility]  Tobacco Use: High Risk (06/04/2023)    Readmission Risk Interventions    06/06/2023   11:28 AM  Readmission Risk Prevention Plan  Transportation Screening Complete  PCP or Specialist Appt within 3-5 Days Complete  Social Work Consult for Recovery Care Planning/Counseling Complete  Palliative Care Screening Not Applicable  Medication Review Oceanographer) Referral to Pharmacy

## 2023-06-09 NOTE — Progress Notes (Signed)
Progress Note    Allen Chavez  ZOX:[id number] DOB: Apr 14, 1956  DOA: 05/28/2023 PCP: [name], MD      Brief Narrative:    Medical records reviewed and are as summarized below:  Allen Chavez is a 67 y.o. male with medical history significant of insulin-dependent diabetes mellitus, hypertension, PAD and CKD stage IIIa presented to ED with generalized malaise and worsening right foot wound with concern of gangrene and surrounding erythema with tracking up to upper legs for about 2 days prior to admission.  Patient also endorsed some subjective fever and chills at home.  He also had poor oral intake.   On presentation  Patient was afebrile, tachycardic at 112 and softer blood pressure 94/52.  Labs pertinent for leukocytosis at 18.1, absolute neutrophil 15.8, hemoglobin 11.5, lactic acid 3.7, sodium 133, CO2 21, glucose 157, BUN 45, creatinine 3.24 with GFR of 20, baseline seems to be around 1.4-1.5.  CXR with no active cardiopulmonary disease. Right foot imaging with asymmetric soft tissue swelling around fifth toe with some probable small amount of air in the soft tissue.  Also noted to have a small cortical breach in the distal phalanx, concerning for acute osteomyelitis.   EKG shows sinus tachycardia without acute ST-T changes   Patient met severe sepsis criteria and blood cultures were drawn.  Received IV fluid and started on broad-spectrum antibiotics.  Vascular surgery and podiatry was consulted.  10/31: Afebrile this morning, maximum temperature recorded of 100.5 overnight.  Lactic acidosis resolved, as some improvement in leukocytosis and hemoglobin decreased to 9.3, all cell line decreased so likely some dilutional effect.  Significantly elevated CRP at 27.3, ESR 81, A1c 7.1, TSH elevated at 12.732, checking thyroid panel.  Preliminary blood cultures negative.  MRI of right foot with concern of osteo of fifth toe, no discrete abscess.  Going for angiography with vascular  today, followed by likely transmetatarsal amputation on Friday or Saturday Hypoglycemia as patient was n.p.o., switching fluids to LR with D5  11/1: S/p angiography and stents placement in right lower extremity.  Transmetatarsal amputation scheduled for tomorrow.  Creatinine slowly improving, at 1.92 today, improving leukocytosis And stable hemoglobin.  Anemia panel consistent with anemia of chronic disease with some iron and B12 deficiency.  B12 at 177.  Ordered IM replacement and also starting on supplement.  11/2: Patient had his right transmetatarsal amputation earlier today, tolerated the procedure well. Thyroid panel with some improvement in TSH to 7, increased T3 uptake but normal T4 and T3. Will need a repeat in 3 to 4 weeks by PCP.  11/3: Vital stable, creatinine continue to slowly improve, at 1.67 today.  Persistent leukocytosis likely some reactive aliment as patient had his TMA yesterday.  Preliminary wound culture with no organism but patient was already on antibiotics.  Pending PT/OT evaluation  11/4: Podiatry is concerned about a spot at his stump, will be high risk for BKA due to decreased blood supply.  PT is recommending SNF.  Cultures are growing group C strep, antibiotics switched with Unasyn.  11/5: Patient with failing TMA flap, vascular surgery was reconsulted and patient will be going for BKA on right, likely tomorrow depending OR availability.      Assessment/Plan:   Principal Problem:   Severe sepsis (HCC) Active Problems:   AKI (acute kidney injury) (HCC)   Peripheral arterial disease (HCC)   Type 2 diabetes mellitus (HCC)   Essential (primary) hypertension   Hyperlipidemia   GERD (gastroesophageal reflux disease)  Malignant neoplasm of prostate (HCC)   Gangrene of right foot (HCC)   Necrotizing subcutaneous infection (HCC)   Elevated TSH   Normocytic anemia   Acute blood loss as cause of postoperative anemia   Nutrition Problem: Increased nutrient  needs Etiology: post-op healing  Signs/Symptoms: estimated needs   Body mass index is 22.53 kg/m.   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  S/p right foot transmetatarsal amputation on 05/31/2023.  Right foot deep wound cultures from 05/31/2023 showed rare Streptococcus group G.  No growth on blood cultures. Leukocytosis has improved.  IV Unasyn was discontinued on 06/05/2023. Continue analgesics as needed for pain. Lactic acid was 3.7 on admission.  He was treated with IV fluids and IV antibiotics.   Peripheral vascular disease: S/p percutaneous transluminal angioplasty and stent placement to right SFA and popliteal arteries on 05/29/2023 History of left BKA many years ago Continue Lipitor, aspirin and Plavix.  Follow-up with vascular surgeon.   Acute blood loss anemia, normocytic anemia: Hemoglobin improved from 7.2-8.3.  S/p transfusion with 1 unit of PRBCs on 06/05/2023.   Vitamin B12 deficiency (vitamin B12 177): Continue vitamin B12 supplement. Ferritin 510, iron 15, iron saturation ratio 7, TIBC 218.   AKI on CKD stage IIIa : Improved.   Creatinine was 3.24 on admission.   Hypertension: BP is okay.  Lisinopril on hold because of AKI    Type II DM with hyperglycemia, s/p hypoglycemia on 06/07/2023: Glucose is trending upward.  Increase insulin glargine from 10 units daily to 10 units twice daily.  NovoLog as needed per sliding scale. He was taking Lantus 60 units daily and NovoLog 2 to 10 units with meals at home. Hemoglobin A1c 7.7 in September 2024   Elevated TSH: Initial TSH was 12.732 on 05/28/2023.  Repeat TSH was 7.430 on 05/29/2023.  Free T4 was normal.  This could be subclinical hypothyroidism.  Repeat thyroid function tests as an outpatient in 4 to 6 weeks    Other comorbidities include hyperlipidemia, GERD    Repeat CBC and BMP tomorrow  Diet Order             Diet regular Room service appropriate? Yes; Fluid consistency:  Thin  Diet effective now                            Consultants: Vascular surgeon Podiatrist  Procedures: Right BKA on 06/04/2023 Right foot transmetatarsal amputation 05/31/2023 Percutaneous transluminal angioplasty and stent placement to right lower extremity popliteal and right SFA arteries    Medications:    sodium chloride   Intravenous Once   vitamin C  500 mg Oral BID   aspirin EC  81 mg Oral Daily   atorvastatin  40 mg Oral Daily   clopidogrel  75 mg Oral Daily   Fe Fum-Vit C-Vit B12-FA  1 capsule Oral BID   fenofibrate  160 mg Oral Daily   heparin  5,000 Units Subcutaneous Q8H   insulin aspart  0-15 Units Subcutaneous TID WC   insulin aspart  0-5 Units Subcutaneous QHS   insulin aspart  5 Units Subcutaneous TID WC   insulin glargine-yfgn  10 Units Subcutaneous BID   lactulose  30 g Oral BID   melatonin  5 mg Oral QHS   multivitamin with minerals  1 tablet Oral Daily   nutrition supplement (JUVEN)  1 packet Oral BID BM   pantoprazole  40 mg Oral Daily  polyethylene glycol  17 g Oral BID   sodium chloride flush  3 mL Intravenous Q12H   zinc sulfate (50mg  elemental zinc)  220 mg Oral Daily   Continuous Infusions:     Anti-infectives (From admission, onward)    Start     Dose/Rate Route Frequency Ordered Stop   06/02/23 1800  Ampicillin-Sulbactam (UNASYN) 3 g in sodium chloride 0.9 % 100 mL IVPB  Status:  Discontinued        3 g 200 mL/hr over 30 Minutes Intravenous Every 6 hours 06/02/23 1408 06/05/23 0740   06/01/23 1200  vancomycin (VANCOREADY) IVPB 1250 mg/250 mL  Status:  Discontinued        1,250 mg 166.7 mL/hr over 90 Minutes Intravenous Every 24 hours 06/01/23 0918 06/02/23 1408   05/31/23 1345  vancomycin (VANCOREADY) IVPB 1250 mg/250 mL        1,250 mg 166.7 mL/hr over 90 Minutes Intravenous  Once 05/31/23 1254 05/31/23 1434   05/30/23 0900  vancomycin (VANCOREADY) IVPB 1250 mg/250 mL        1,250 mg 166.7 mL/hr over 90 Minutes  Intravenous  Once 05/30/23 0811 05/30/23 1135   05/30/23 0815  ceFEPIme (MAXIPIME) 2 g in sodium chloride 0.9 % 100 mL IVPB  Status:  Discontinued        2 g 200 mL/hr over 30 Minutes Intravenous Every 12 hours 05/30/23 0812 06/02/23 1408   05/29/23 1600  ceFEPIme (MAXIPIME) 2 g in sodium chloride 0.9 % 100 mL IVPB  Status:  Discontinued        2 g 200 mL/hr over 30 Minutes Intravenous Every 24 hours 05/28/23 1801 05/30/23 0812   05/29/23 1556  ceFAZolin (ANCEF) IVPB 2g/100 mL premix  Status:  Discontinued        2 g 200 mL/hr over 30 Minutes Intravenous 30 min pre-op 05/29/23 1556 05/29/23 1747   05/29/23 0800  vancomycin variable dose per unstable renal function (pharmacist dosing)  Status:  Discontinued         Does not apply See admin instructions 05/28/23 1808 06/01/23 0918   05/29/23 0600  metroNIDAZOLE (FLAGYL) IVPB 500 mg  Status:  Discontinued        500 mg 100 mL/hr over 60 Minutes Intravenous Every 12 hours 05/28/23 1746 06/02/23 1408   05/28/23 1900  vancomycin (VANCOREADY) IVPB 750 mg/150 mL        750 mg 150 mL/hr over 60 Minutes Intravenous  Once 05/28/23 1802 05/28/23 2139   05/28/23 1615  ceFEPIme (MAXIPIME) 2 g in sodium chloride 0.9 % 100 mL IVPB        2 g 200 mL/hr over 30 Minutes Intravenous  Once 05/28/23 1609 05/28/23 1715   05/28/23 1615  metroNIDAZOLE (FLAGYL) IVPB 500 mg        500 mg 100 mL/hr over 60 Minutes Intravenous  Once 05/28/23 1609 05/28/23 1829   05/28/23 1615  vancomycin (VANCOCIN) IVPB 1000 mg/200 mL premix        1,000 mg 200 mL/hr over 60 Minutes Intravenous  Once 05/28/23 1609 05/28/23 2026              Family Communication/Anticipated D/C date and plan/Code Status   DVT prophylaxis: heparin injection 5,000 Units Start: 05/28/23 1730     Code Status: Full Code  Family Communication: None Disposition Plan: Plan to discharge to SNF   Status is: Inpatient Remains inpatient appropriate because: S/p right  BKA       Subjective:  C/o pain in right stump wound.  No shortness of breath or chest pain.  Objective:    Vitals:   06/08/23 0102 06/08/23 0723 06/08/23 1526 06/09/23 0822  BP: 132/67 119/74 (!) 128/58 139/65  Pulse: 72 82 83 81  Resp: 16 18 16 18   Temp: 98.4 F (36.9 C) 98.4 F (36.9 C) 97.9 F (36.6 C) 98.1 F (36.7 C)  TempSrc: Oral   Oral  SpO2: 95% 96% 96% 95%  Weight:      Height:       No data found.   Intake/Output Summary (Last 24 hours) at 06/09/2023 1410 Last data filed at 06/09/2023 0900 Gross per 24 hour  Intake 240 ml  Output 1450 ml  Net -1210 ml   Filed Weights   06/05/23 0500 06/06/23 0500 06/07/23 0500  Weight: 81.2 kg 79.3 kg 79.6 kg    Exam:  GEN: NAD SKIN: Warm and dry EYES: No pallor or icterus  ENT: MMM CV: RRR PULM: CTA B ABD: soft, ND, NT, +BS CNS: AAO x 3, non focal EXT: Right BKA stump wound is clean, dry and intact.  Old left BKA       Data Reviewed:   I have personally reviewed following labs and imaging studies:  Labs: Labs show the following:   Basic Metabolic Panel: Recent Labs  Lab 06/03/23 0529 06/04/23 0514 06/04/23 0514 06/05/23 0522 06/08/23 0532  NA  --  140  --  135 132*  K  --  3.5   < > 4.1 4.2  CL  --  110  --  107 104  CO2  --  23  --  22 24  GLUCOSE  --  81  --  259* 222*  BUN  --  30*  --  31* 35*  CREATININE 1.51* 1.37*  --  1.27* 1.39*  CALCIUM  --  8.4*  --  7.6* 8.0*   < > = values in this interval not displayed.   GFR Estimated Creatinine Clearance: 57.3 mL/min (A) (by C-G formula based on SCr of 1.39 mg/dL (H)). Liver Function Tests: No results for input(s): "AST", "ALT", "ALKPHOS", "BILITOT", "PROT", "ALBUMIN" in the last 168 hours.  No results for input(s): "LIPASE", "AMYLASE" in the last 168 hours. No results for input(s): "AMMONIA" in the last 168 hours. Coagulation profile No results for input(s): "INR", "PROTIME" in the last 168 hours.   CBC: Recent Labs  Lab  06/04/23 0514 06/05/23 0522 06/06/23 0224 06/08/23 0532  WBC 16.7* 11.8* 12.2* 11.3*  NEUTROABS  --   --  9.1* 8.9*  HGB 10.6* 7.2* 8.3* 8.6*  HCT 30.7* 20.9* 24.2* 25.4*  MCV 80.6 80.1 81.8 83.6  PLT 465* 441* 430* 433*   Cardiac Enzymes: No results for input(s): "CKTOTAL", "CKMB", "CKMBINDEX", "TROPONINI" in the last 168 hours. BNP (last 3 results) No results for input(s): "PROBNP" in the last 8760 hours. CBG: Recent Labs  Lab 06/08/23 1128 06/08/23 1657 06/08/23 2248 06/09/23 0750 06/09/23 1120  GLUCAP 193* 193* 193* 180* 199*   D-Dimer: No results for input(s): "DDIMER" in the last 72 hours. Hgb A1c: No results for input(s): "HGBA1C" in the last 72 hours. Lipid Profile: No results for input(s): "CHOL", "HDL", "LDLCALC", "TRIG", "CHOLHDL", "LDLDIRECT" in the last 72 hours. Thyroid function studies: No results for input(s): "TSH", "T4TOTAL", "T3FREE", "THYROIDAB" in the last 72 hours.  Invalid input(s): "FREET3" Anemia work up: No results for input(s): "VITAMINB12", "FOLATE", "FERRITIN", "TIBC", "IRON", "RETICCTPCT" in the last 72 hours.  Sepsis Labs: Recent Labs  Lab 06/04/23 0514 06/05/23 0522 06/06/23 0224 06/08/23 0532  WBC 16.7* 11.8* 12.2* 11.3*    Microbiology Recent Results (from the past 240 hour(s))  Surgical PCR screen     Status: None   Collection Time: 05/31/23 12:00 AM   Specimen: Nasal Mucosa; Nasal Swab  Result Value Ref Range Status   MRSA, PCR NEGATIVE NEGATIVE Final   Staphylococcus aureus NEGATIVE NEGATIVE Final    Comment: (NOTE) The Xpert SA Assay (FDA approved for NASAL specimens in patients 75 years of age and older), is one component of a comprehensive surveillance program. It is not intended to diagnose infection nor to guide or monitor treatment. Performed at [facility], [address] [place], [place] [address]   Aerobic/Anaerobic Culture w Gram Stain (surgical/deep wound)     Status: None   Collection Time:  05/31/23  9:07 AM   Specimen: Wound; Tissue  Result Value Ref Range Status   Specimen Description   Final    WOUND Performed at [facility], [address] [place], [place] [address]    Special Requests   Final    NONE Performed at [facility], [address] [place], [place] [address]    Gram Stain NO WBC SEEN NO ORGANISMS SEEN   Final   Culture   Final    RARE STREPTOCOCCUS GROUP G Beta hemolytic streptococci are predictably susceptible to penicillin and other beta lactams. Susceptibility testing not routinely performed. NO ANAEROBES ISOLATED Performed at [facility], [address], [place], [place] [address]    Report Status 06/05/2023 FINAL  Final  Aerobic/Anaerobic Culture w Gram Stain (surgical/deep wound)     Status: None   Collection Time: 05/31/23  9:12 AM   Specimen: Wound; Tissue  Result Value Ref Range Status   Specimen Description   Final    WOUND Performed at [facility], [address] [place], [place] [address]    Special Requests   Final    NONE Performed at [facility], [address] [place], [place] [address]    Gram Stain NO WBC SEEN NO ORGANISMS SEEN   Final   Culture   Final    RARE STREPTOCOCCUS GROUP G Beta hemolytic streptococci are predictably susceptible to penicillin and other beta lactams. Susceptibility testing not routinely performed. NO ANAEROBES ISOLATED Performed at [facility], [address] [place], [place] [address]    Report Status 06/05/2023 FINAL  Final    Procedures and diagnostic studies:  No results found.             LOS: 12 days   Shearon Clonch  [employer]ered loss adjuster on [email or web address]. If 7PM-7AM, please contact night-coverage at [email or web address]     06/09/2023, 2:10 PM

## 2023-06-10 DIAGNOSIS — A419 Sepsis, unspecified organism: Secondary | ICD-10-CM | POA: Diagnosis not present

## 2023-06-10 DIAGNOSIS — R6520 Severe sepsis without septic shock: Secondary | ICD-10-CM | POA: Diagnosis not present

## 2023-06-10 DIAGNOSIS — I739 Peripheral vascular disease, unspecified: Secondary | ICD-10-CM | POA: Diagnosis not present

## 2023-06-10 LAB — BASIC METABOLIC PANEL
Anion gap: 5 (ref 5–15)
BUN: 25 mg/dL — ABNORMAL HIGH (ref 8–23)
CO2: 26 mmol/L (ref 22–32)
Calcium: 8.3 mg/dL — ABNORMAL LOW (ref 8.9–10.3)
Chloride: 102 mmol/L (ref 98–111)
Creatinine, Ser: 1.33 mg/dL — ABNORMAL HIGH (ref 0.61–1.24)
GFR, Estimated: 58 mL/min — ABNORMAL LOW (ref >60)
Glucose, Bld: 187 mg/dL — ABNORMAL HIGH (ref 70–99)
Potassium: 4 mmol/L (ref 3.5–5.1)
Sodium: 133 mmol/L — ABNORMAL LOW (ref 135–145)

## 2023-06-10 LAB — CBC WITH DIFFERENTIAL/PLATELET
Abs Immature Granulocytes: 0.14 10*3/uL — ABNORMAL HIGH (ref 0.00–0.07)
Basophils Absolute: 0.1 10*3/uL (ref 0.0–0.1)
Basophils Relative: 0 %
Eosinophils Absolute: 0.2 10*3/uL (ref 0.0–0.5)
Eosinophils Relative: 2 %
HCT: 26.1 % — ABNORMAL LOW (ref 39.0–52.0)
Hemoglobin: 8.7 g/dL — ABNORMAL LOW (ref 13.0–17.0)
Immature Granulocytes: 1 %
Lymphocytes Relative: 8 %
Lymphs Abs: 1.1 10*3/uL (ref 0.7–4.0)
MCH: 28 pg (ref 26.0–34.0)
MCHC: 33.3 g/dL (ref 30.0–36.0)
MCV: 83.9 fL (ref 80.0–100.0)
Monocytes Absolute: 0.6 10*3/uL (ref 0.1–1.0)
Monocytes Relative: 5 %
Neutro Abs: 11.8 10*3/uL — ABNORMAL HIGH (ref 1.7–7.7)
Neutrophils Relative %: 84 %
Platelets: 450 10*3/uL — ABNORMAL HIGH (ref 150–400)
RBC: 3.11 MIL/uL — ABNORMAL LOW (ref 4.22–5.81)
RDW: 17.2 % — ABNORMAL HIGH (ref 11.5–15.5)
WBC: 13.9 10*3/uL — ABNORMAL HIGH (ref 4.0–10.5)
nRBC: 0 % (ref 0.0–0.2)

## 2023-06-10 LAB — GLUCOSE, CAPILLARY
Glucose-Capillary: 187 mg/dL — ABNORMAL HIGH (ref 70–99)
Glucose-Capillary: 216 mg/dL — ABNORMAL HIGH (ref 70–99)
Glucose-Capillary: 258 mg/dL — ABNORMAL HIGH (ref 70–99)
Glucose-Capillary: 194 mg/dL — ABNORMAL HIGH (ref 70–99)

## 2023-06-10 MED ORDER — ASPIRIN 81 MG PO TBEC: 81.0000 mg | DELAYED_RELEASE_TABLET | Freq: Every day | ORAL | Status: DC

## 2023-06-10 MED ORDER — OXYCODONE HCL 5 MG PO TABS: 5.0000 mg | ORAL_TABLET | Freq: Three times a day (TID) | ORAL | 0 refills | Status: DC | PRN

## 2023-06-10 MED ORDER — ACETAMINOPHEN 325 MG PO TABS: 650.0000 mg | ORAL_TABLET | Freq: Four times a day (QID) | ORAL | Status: DC | PRN

## 2023-06-10 MED ORDER — POLYETHYLENE GLYCOL 3350 17 G PO PACK: 17.0000 g | PACK | Freq: Every day | ORAL | Status: DC | PRN

## 2023-06-10 MED ORDER — INSULIN GLARGINE 100 UNIT/ML SOLOSTAR PEN: 30.0000 [IU] | PEN_INJECTOR | Freq: Every day | SUBCUTANEOUS | Status: DC

## 2023-06-10 MED ORDER — BISACODYL 10 MG RE SUPP: 10.0000 mg | Freq: Every day | RECTAL | Status: DC | PRN

## 2023-06-10 MED ORDER — CLOPIDOGREL BISULFATE 75 MG PO TABS: 75.0000 mg | ORAL_TABLET | Freq: Every day | ORAL | Status: DC

## 2023-06-10 NOTE — Progress Notes (Signed)
Discharge cancelled for now pending bowel movement. No BM documented in 3 days. Patient does not remember last BM. Refused Bowel medications this AM. Explained again to patient the need to take these medications, patient agreed. Miralax and lactulose given.  Jasmine at [facility] says they will receive patient once he has bowel movement. Case manager and MD aware.

## 2023-06-10 NOTE — Discharge Summary (Signed)
Physician Discharge Summary   Patient: Pacen Paradee MRN: [id number] DOB: Jun 19, 1956  Admit date:     05/28/2023  Discharge date: 06/10/23  Discharge Physician: Lurene Shadow   PCP: [name], MD   Recommendations at discharge:   Follow-up with Dr. Wyn Quaker, vascular surgeon in about 2 weeks Follow-up with physician at the nursing home within 3 days of discharge  Discharge Diagnoses: Principal Problem:   Severe sepsis [facility]) Active Problems:   AKI (acute kidney injury) (HCC)   Peripheral arterial disease (HCC)   Type 2 diabetes mellitus (HCC)   Essential (primary) hypertension   Hyperlipidemia   GERD (gastroesophageal reflux disease)   Malignant neoplasm of prostate (HCC)   Gangrene of right foot (HCC)   Necrotizing subcutaneous infection (HCC)   Elevated TSH   Normocytic anemia   Acute blood loss as cause of postoperative anemia  Resolved Problems:   * No resolved hospital problems. *  Hospital Course:  Guinn Weishaupt is a 67 y.o. male with medical history significant of insulin-dependent diabetes mellitus, hypertension, PAD and CKD stage IIIa presented to ED with generalized malaise and worsening right foot wound with concern of gangrene and surrounding erythema with tracking up to upper legs for about 2 days prior to admission.  Patient also endorsed some subjective fever and chills at home.  He also had poor oral intake.     On presentation  Patient was afebrile, tachycardic at 112 and softer blood pressure 94/52.  Labs pertinent for leukocytosis at 18.1, absolute neutrophil 15.8, hemoglobin 11.5, lactic acid 3.7, sodium 133, CO2 21, glucose 157, BUN 45, creatinine 3.24 with GFR of 20, baseline seems to be around 1.4-1.5.  CXR with no active cardiopulmonary disease. Right foot imaging with asymmetric soft tissue swelling around fifth toe with some probable small amount of air in the soft tissue.  Also noted to have a small cortical breach in the distal phalanx, concerning for  acute osteomyelitis.   EKG shows sinus tachycardia without acute ST-T changes   Patient met severe sepsis criteria and blood cultures were drawn.  Received IV fluid and started on broad-spectrum antibiotics.  Vascular surgery and podiatry was consulted.   10/31: Afebrile this morning, maximum temperature recorded of 100.5 overnight.  Lactic acidosis resolved, as some improvement in leukocytosis and hemoglobin decreased to 9.3, all cell line decreased so likely some dilutional effect.  Significantly elevated CRP at 27.3, ESR 81, A1c 7.1, TSH elevated at 12.732, checking thyroid panel.  Preliminary blood cultures negative.  MRI of right foot with concern of osteo of fifth toe, no discrete abscess.  Going for angiography with vascular today, followed by likely transmetatarsal amputation on Friday or Saturday Hypoglycemia as patient was n.p.o., switching fluids to LR with D5   11/1: S/p angiography and stents placement in right lower extremity.  Transmetatarsal amputation scheduled for tomorrow.  Creatinine slowly improving, at 1.92 today, improving leukocytosis And stable hemoglobin.  Anemia panel consistent with anemia of chronic disease with some iron and B12 deficiency.  B12 at 177.  Ordered IM replacement and also starting on supplement.   11/2: Patient had his right transmetatarsal amputation earlier today, tolerated the procedure well. Thyroid panel with some improvement in TSH to 7, increased T3 uptake but normal T4 and T3. Will need a repeat in 3 to 4 weeks by PCP.   11/3: Vital stable, creatinine continue to slowly improve, at 1.67 today.  Persistent leukocytosis likely some reactive aliment as patient had his TMA yesterday.  Preliminary wound  culture with no organism but patient was already on antibiotics.  Pending PT/OT evaluation   11/4: Podiatry is concerned about a spot at his stump, will be high risk for BKA due to decreased blood supply.  PT is recommending SNF.  Cultures are growing  group C strep, antibiotics switched with Unasyn.   11/5: Patient with failing TMA flap, vascular surgery was reconsulted and patient will be going for BKA on right, likely tomorrow depending OR availability.    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  S/p right foot transmetatarsal amputation on 05/31/2023.  Right foot deep wound cultures from 05/31/2023 showed rare Streptococcus group G.  No growth on blood cultures. Leukocytosis has improved.  IV Unasyn was discontinued on 06/05/2023. Continue analgesics as needed for pain. Lactic acid was 3.7 on admission.  He was treated with IV fluids and IV antibiotics.     Peripheral vascular disease: S/p percutaneous transluminal angioplasty and stent placement to right SFA and popliteal arteries on 05/29/2023 History of left BKA many years ago Continue Lipitor, aspirin and Plavix.  Continue local wound care and stretching exercises.  Follow-up with vascular surgeon as an outpatient.     Acute blood loss anemia, normocytic anemia: Hemoglobin improved from 7.2-8.7. S/p transfusion with 1 unit of PRBCs on 06/05/2023.   Vitamin B12 deficiency (vitamin B12 177): Continue vitamin B12 supplement. Ferritin 510, iron 15, iron saturation ratio 7, TIBC 218.     AKI on CKD stage IIIa : Improved.   Creatinine was 3.24 on admission.     Hypertension: Resume lisinopril at discharge     Type II DM with hyperglycemia, s/p hypoglycemia on 06/07/2023:  Resume Lantus but at a lower dose.  Patient will be discharged on Lantus 30 units daily.  NovoLog has been discontinued to simplify insulin regimen. Monitor glucose closely and adjust insulin as needed.  NovoLog can be reintroduced depending on glucose levels. He was taking Lantus 60 units daily and NovoLog 2 to 10 units with meals at home, prior to admission. Hemoglobin A1c 7.7 in September 2024     Elevated TSH: Initial TSH was 12.732 on 05/28/2023.  Repeat  TSH was 7.430 on 05/29/2023.  Free T4 was normal.  This could be subclinical hypothyroidism.  Repeat thyroid function tests as an outpatient in 4 to 6 weeks       Other comorbidities include hyperlipidemia, GERD   His condition has improved and he is deemed stable for discharge to SNF today. He's agreeable with the discharge plan.  Case was discussed with Dr. Margaretmary Eddy, [facility], for insurance authorization.      Pain control - [employer] Controlled Substance Reporting System database was reviewed. and patient was instructed, not to drive, operate heavy machinery, perform activities at heights, swimming or participation in water activities or provide baby-sitting services while on Pain, Sleep and Anxiety Medications; until their outpatient Physician has advised to do so again. Also recommended to not to take more than prescribed Pain, Sleep and Anxiety Medications.  Consultants: Vascular surgeon, podiatrist Procedures performed:  Right BKA on 06/04/2023 Right foot transmetatarsal amputation 05/31/2023 Percutaneous transluminal angioplasty and stent placement to right lower extremity popliteal and right SFA arteries  Disposition: Skilled nursing facility Diet recommendation:  Cardiac and Carb modified diet DISCHARGE MEDICATION: Allergies as of 06/10/2023   No Known Allergies      Medication List     STOP taking these medications    Insulin  Aspart FlexPen 100 UNIT/ML Commonly known as: NOVOLOG       TAKE these medications    acetaminophen 325 MG tablet Commonly known as: TYLENOL Take 2 tablets (650 mg total) by mouth every 6 (six) hours as needed for mild pain (pain score 1-3) (or Fever >/= 101).   aspirin EC 81 MG tablet Take 1 tablet (81 mg total) by mouth daily. Swallow whole. Start taking on: June 11, 2023   atorvastatin 40 MG tablet Commonly known as: LIPITOR Take 40 mg by mouth daily.   clopidogrel 75 MG tablet Commonly known as:  PLAVIX Take 1 tablet (75 mg total) by mouth daily. Start taking on: June 11, 2023   fenofibrate 160 MG tablet Take 1 tablet by mouth daily.   insulin glargine 100 UNIT/ML Solostar Pen Commonly known as: LANTUS Inject 30 Units into the skin daily. Start taking on: June 11, 2023 What changed: how much to take   lisinopril 20 MG tablet Commonly known as: ZESTRIL Take 1 tablet by mouth daily.   omeprazole 20 MG capsule Commonly known as: PRILOSEC Take by mouth.   oxyCODONE 5 MG immediate release tablet Commonly known as: Oxy IR/ROXICODONE Take 1 tablet (5 mg total) by mouth every 8 (eight) hours as needed for moderate pain (pain score 4-6).   polyethylene glycol 17 g packet Commonly known as: MIRALAX / GLYCOLAX Take 17 g by mouth daily as needed.               Discharge Care Instructions  (From admission, onward)           Start     Ordered   06/10/23 0000  Discharge wound care:       Comments: Right BKA stump wound: Xeroform to staple line x 2, cover with dry gauze, cover with 2 ABD pads, wrapped with Kerlix x 2, wrapped with Ace bandage snugly to help reduce swelling.   06/10/23 1339            Contact information for follow-up providers     Dew, Marlow Baars, MD Follow up in 2 week(s).   Specialties: Vascular Surgery, Radiology, Interventional Cardiology Contact information: [address] [place] [address] [phone]              Contact information for after-discharge care     Destination     [facility] .   Service: Skilled Nursing Contact information: [address] [place] [address] [phone]                    Discharge Exam: Ceasar Mons Weights   06/06/23 0500 06/07/23 0500 06/10/23 0500  Weight: 79.3 kg 79.6 kg 75.4 kg   GEN: NAD SKIN: Warm and dry EYES: No pallor or icterus ENT: MMM CV: RRR PULM: CTA B ABD: soft, ND, NT, +BS CNS: AAO x 3, non  focal EXT: Dressing on right BKA stump wound is clean, dry and intact.  Old left BKA.   Condition at discharge: good  The results of significant diagnostics from this hospitalization (including imaging, microbiology, ancillary and laboratory) are listed below for reference.   Imaging Studies: DG Foot Complete Right  Result Date: 05/31/2023 CLINICAL DATA:  Osteomyelitis post transmetatarsal amputation EXAM: RIGHT FOOT COMPLETE - 3+ VIEW COMPARISON:  Radiographs 05/28/2023.  MRI 05/28/2023. FINDINGS: Patient has undergone interval amputation through the bases of all of the metatarsals. Stable mild midfoot degenerative changes, primarily at the talonavicular joint. Skin  staples are in place. There is a small amount of air within the distal soft tissues attributed to the recent surgery. Scattered vascular calcifications noted. IMPRESSION: Interval transmetatarsal amputation as described. No acute osseous findings. Electronically Signed   By: Carey Bullocks M.D.   On: 05/31/2023 13:57   US ARTERIAL ABI (SCREENING LOWER EXTREMITY)  Result Date: 05/30/2023 CLINICAL DATA:  Gangrene of the distal right foot. History of diabetes and hypertension. Prior left lower extremity below-knee amputation. EXAM: NONINVASIVE PHYSIOLOGIC VASCULAR STUDY OF BILATERAL LOWER EXTREMITIES TECHNIQUE: Evaluation of both lower extremities were performed at rest, including calculation of ankle-brachial indices with single level Doppler, pressure and pulse volume recording. COMPARISON:  None Available. FINDINGS: Right ABI:  0.58 Left ABI:  N/A Right Lower Extremity: The posterior tibial artery waveform is monophasic. A waveform cannot be detected at the level of the dorsalis pedis artery. 0.5-0.79 Moderate PAD IMPRESSION: 1. Right ankle-brachial index of 0.58 consistent with moderate arterial insufficiency. Monophasic waveform of the right posterior tibial artery. A waveform cannot be detected at the level of the dorsalis pedis  artery. 2. Prior left lower extremity below-knee amputation. Electronically Signed   By: Irish Lack M.D.   On: 05/30/2023 08:13   US RENAL  Result Date: 05/29/2023 CLINICAL DATA:  Acute kidney injury EXAM: RENAL / URINARY TRACT ULTRASOUND COMPLETE COMPARISON:  None Available. FINDINGS: Right Kidney: Renal measurements: 12 x 6.8 x 6.2 cm = volume: 264.6 mL. Echogenicity within normal limits. No mass or hydronephrosis visualized. Left Kidney: Renal measurements: 11.5 x 5.7 x 5 cm = volume: 171.4 mL. Echogenicity normal. Slight prominence of the left renal pelvis but without convincing hydronephrosis or calyceal enlargement. Bladder: Appears normal for degree of bladder distention. Other: None. IMPRESSION: Negative renal ultrasound. Electronically Signed   By: Jasmine Pang M.D.   On: 05/29/2023 20:58   PERIPHERAL VASCULAR CATHETERIZATION  Result Date: 05/29/2023 See surgical note for result.  MR FOOT RIGHT WO CONTRAST  Result Date: 05/28/2023 CLINICAL DATA:  Necrotizing infection to dorsal portion of distal right foot with cellulitis extending up the leg. Further evaluation for depth of invasion as well as potential surgical access. EXAM: MRI OF THE RIGHT FOREFOOT WITHOUT CONTRAST TECHNIQUE: Multiplanar, multisequence MR imaging of the right forefoot was performed. No intravenous contrast was administered. COMPARISON:  right foot radiographs 05/28/2023, MRI right foot 06/15/2009 FINDINGS: Bones/Joint/Cartilage There is mild marrow edema on the sesamoid greater than great toe metatarsal head side of the medial great toe plantar sesamoid hallux articulation (sagittal series 8, image 31), likely degenerative. There is mildly increased STIR signal within the middle phalanx of the third toe. This is also seen on the coronal T2 fat saturation and axial T2 fat saturation images, however there is inhomogeneous fat saturation on those sequences, and artifactual increased T2 signal and other areas making  this sequences less reliable for marrow edema. There is mild-to-moderate marrow edema within the fifth metatarsal head (sagittal series 8, images 9-11). There is moderate extension positioning of the fifth metatarsophalangeal joint, similar to remote 06/15/2009 MRI. Within the limitations of inhomogeneous fat saturation and increased T2 signal artifact, there may be mildly increased T2/STIR signal within the middle and distal phalanges of the fifth toe. Given the suspected cellulitis in the fifth toe, this remains suspicious for early acute osteomyelitis. No definitive cortical erosion is identified. Ligaments The Lisfranc ligament complex is intact. The proximal phalangeal insertion of the fifth metatarsophalangeal medial collateral ligament is not well visualized and is difficult to exclude a  tear. Muscles and Tendons No tendon tear is seen. There is nonspecific moderate edema seen throughout the flexor digitorum brevis greater than extensor digitorum brevis musculature. Soft tissues There is mild-to-moderate soft tissue edema and swelling about the fifth toe and fifth metatarsophalangeal joint, as seen on today's radiographs. This is greatest at the dorsal aspect of the junction between the base of the fourth and fifth toes. Mild subcutaneous fat edema and swelling of the dorsal midfoot. No walled-off abscess is identified. IMPRESSION: 1. Mild-to-moderate soft tissue edema and swelling about the fifth toe and fifth metatarsophalangeal joint, as seen on today's radiographs. No walled-off abscess is identified. 2. There is mild-to-moderate marrow edema within the fifth metatarsal head. There may be mildly increased T2/STIR signal within the middle and distal phalanges of the fifth toe. Given the suspected cellulitis in the fifth toe, this remains suspicious for early acute osteomyelitis. No definitive cortical erosion is identified. 3. There is mildly increased STIR signal within the middle phalanx of the third  toe. This is favored to be artifactual, due to inhomogeneous fat suppression. Recommend clinical correlation for concerns of infection within the third toe. 4. There is nonspecific moderate edema seen throughout the flexor digitorum brevis greater than extensor digitorum brevis musculature. Electronically Signed   By: Neita Garnet M.D.   On: 05/28/2023 21:19   DG Foot Complete Right  Result Date: 05/28/2023 CLINICAL DATA:  large ulcer to dorsal distal right foot involving 5th digit with discoloration, evaluting for bone involvement or subQ air EXAM: RIGHT FOOT COMPLETE - 3+ VIEW COMPARISON:  None Available. FINDINGS: No acute fracture or dislocation. There is asymmetric soft tissue swelling around the fifth toe and subtle lucency seen on the AP and oblique projections favoring probable small amount of air. There is small area of probable cortical breach in the tuft of the distal phalanx. Findings are concerning for acute osteomyelitis. Correlate clinically to determine the need for additional imaging with more sensitive modality such as MRI. Ankle mortise appears intact. No radiopaque foreign bodies. IMPRESSION: *Asymmetric soft tissue swelling around the fifth toe with probable small amount of air in the soft tissue. There is small area of probable cortical breach in the tuft of the distal phalanx. Findings are concerning for acute osteomyelitis. Correlate clinically to determine the need for additional imaging with more sensitive modality such as MRI. Electronically Signed   By: Jules Schick M.D.   On: 05/28/2023 17:11   DG Chest 2 View  Result Date: 05/28/2023 CLINICAL DATA:  Wound on the foot which is infected. Suspected sepsis. EXAM: CHEST - 2 VIEW COMPARISON:  10/03/2010. FINDINGS: Low lung volume. Bilateral lung fields are clear. Elevated right hemidiaphragm noted. Bilateral costophrenic angles are clear. Normal cardio-mediastinal silhouette. No acute osseous abnormalities. The soft tissues are  within normal limits. IMPRESSION: *No active cardiopulmonary disease. Electronically Signed   By: Jules Schick M.D.   On: 05/28/2023 17:06    Microbiology: Results for orders placed or performed during the hospital encounter of 05/28/23  Culture, blood (Routine x 2)     Status: None   Collection Time: 05/28/23  3:40 PM   Specimen: BLOOD  Result Value Ref Range Status   Specimen Description BLOOD BLOOD RIGHT ARM  Final   Special Requests   Final    BOTTLES DRAWN AEROBIC AND ANAEROBIC Blood Culture results may not be optimal due to an excessive volume of blood received in culture bottles   Culture   Final    NO GROWTH  5 DAYS Performed at [facility], [address] [place], [place] [address]    Report Status 06/02/2023 FINAL  Final  Culture, blood (Routine x 2)     Status: None   Collection Time: 05/28/23  4:30 PM   Specimen: BLOOD  Result Value Ref Range Status   Specimen Description BLOOD LEFT ANTECUBITAL  Final   Special Requests   Final    BOTTLES DRAWN AEROBIC AND ANAEROBIC Blood Culture adequate volume   Culture   Final    NO GROWTH 5 DAYS Performed at [facility], [address], [place], [place] [address]    Report Status 06/02/2023 FINAL  Final  Surgical PCR screen     Status: None   Collection Time: 05/31/23 12:00 AM   Specimen: Nasal Mucosa; Nasal Swab  Result Value Ref Range Status   MRSA, PCR NEGATIVE NEGATIVE Final   Staphylococcus aureus NEGATIVE NEGATIVE Final    Comment: (NOTE) The Xpert SA Assay (FDA approved for NASAL specimens in patients 44 years of age and older), is one component of a comprehensive surveillance program. It is not intended to diagnose infection nor to guide or monitor treatment. Performed at [facility], [address] [place], [place] [address]   Aerobic/Anaerobic Culture w Gram Stain (surgical/deep wound)     Status: None   Collection Time: 05/31/23  9:07 AM   Specimen: Wound; Tissue  Result Value  Ref Range Status   Specimen Description   Final    WOUND Performed at [facility], [address] [place], [place] [address]    Special Requests   Final    NONE Performed at [facility], [address] [place], [place] [address]    Gram Stain NO WBC SEEN NO ORGANISMS SEEN   Final   Culture   Final    RARE STREPTOCOCCUS GROUP G Beta hemolytic streptococci are predictably susceptible to penicillin and other beta lactams. Susceptibility testing not routinely performed. NO ANAEROBES ISOLATED Performed at [facility], [address], [place], [place] [address]    Report Status 06/05/2023 FINAL  Final  Aerobic/Anaerobic Culture w Gram Stain (surgical/deep wound)     Status: None   Collection Time: 05/31/23  9:12 AM   Specimen: Wound; Tissue  Result Value Ref Range Status   Specimen Description   Final    WOUND Performed at [facility], [address], [place], [place] [address]    Special Requests   Final    NONE Performed at [facility], [address] [place], [place] [address]    Gram Stain NO WBC SEEN NO ORGANISMS SEEN   Final   Culture   Final    RARE STREPTOCOCCUS GROUP G Beta hemolytic streptococci are predictably susceptible to penicillin and other beta lactams. Susceptibility testing not routinely performed. NO ANAEROBES ISOLATED Performed at [facility], [address], [place], [place] [address]    Report Status 06/05/2023 FINAL  Final    Labs: CBC: Recent Labs  Lab 06/04/23 0514 06/05/23 0522 06/06/23 0224 06/08/23 0532 06/10/23 0503  WBC 16.7* 11.8* 12.2* 11.3* 13.9*  NEUTROABS  --   --  9.1* 8.9* 11.8*  HGB 10.6* 7.2* 8.3* 8.6* 8.7*  HCT 30.7* 20.9* 24.2* 25.4* 26.1*  MCV 80.6 80.1 81.8 83.6 83.9  PLT 465* 441* 430* 433* 450*   Basic Metabolic Panel: Recent Labs  Lab 06/04/23 0514 06/05/23 0522 06/08/23 0532 06/10/23 0503  NA 140 135  132* 133*  K 3.5 4.1 4.2 4.0  CL 110 107 104 102   CO2 23 22 24 26   GLUCOSE 81 259* 222* 187*  BUN 30* 31* 35* 25*  CREATININE 1.37* 1.27* 1.39* 1.33*  CALCIUM 8.4* 7.6* 8.0* 8.3*   Liver Function Tests: No results for input(s): "AST", "ALT", "ALKPHOS", "BILITOT", "PROT", "ALBUMIN" in the last 168 hours. CBG: Recent Labs  Lab 06/09/23 1120 06/09/23 1659 06/09/23 2205 06/10/23 0744 06/10/23 1128  GLUCAP 199* 212* 149* 187* 216*    Discharge time spent: greater than 30 minutes.  Signed: Lurene Shadow, MD [facility] 06/10/2023

## 2023-06-10 NOTE — Plan of Care (Signed)
  Problem: Education: Goal: Ability to describe self-care measures that may prevent or decrease complications (Diabetes Survival Skills Education) will improve Outcome: Progressing Goal: Individualized Educational Video(s) Outcome: Progressing   Problem: Coping: Goal: Ability to adjust to condition or change in health will improve Outcome: Progressing   Problem: Fluid Volume: Goal: Ability to maintain a balanced intake and output will improve Outcome: Progressing   Problem: Health Behavior/Discharge Planning: Goal: Ability to identify and utilize available resources and services will improve Outcome: Progressing Goal: Ability to manage health-related needs will improve Outcome: Progressing   Problem: Metabolic: Goal: Ability to maintain appropriate glucose levels will improve Outcome: Progressing   Problem: Nutritional: Goal: Maintenance of adequate nutrition will improve Outcome: Progressing Goal: Progress toward achieving an optimal weight will improve Outcome: Progressing   Problem: Skin Integrity: Goal: Risk for impaired skin integrity will decrease Outcome: Progressing   Problem: Education: Goal: Knowledge of General Education information will improve Description: Including pain rating scale, medication(s)/side effects and non-pharmacologic comfort measures Outcome: Progressing   Problem: Safety: Goal: Ability to remain free from injury will improve Outcome: Progressing

## 2023-06-10 NOTE — Progress Notes (Signed)
Progress Note    06/10/2023 11:39 AM 6 Days Post-Op  Subjective:  Allen Chavez is a 67 yo male now POD #6 from Right BKA.  Patient is recovering as expected.  Patient is resting comfortably in bed this afternoon eating lunch.  Patient does endorse some pain at the site.  Dressing remains clean dry and intact.  No hematoma seroma.  No complaints overnight.  Vitals all remained stable.    Vitals:   06/10/23 0135 06/10/23 0722  BP: 130/61 (!) 142/69  Pulse: 89 91  Resp: 18 17  Temp: 98.4 F (36.9 C) 98.7 F (37.1 C)  SpO2: 98% 95%   Physical Exam: Cardiac:  RRR, S1, S2 no murmurs appreciated Lungs: Lungs clear on auscultation.  No rales rhonchi or wheezing noted. Incisions: Right lower extremity BKA with dressing applied.  Clean dry and intact.  No drainage noted. Extremities: Bilateral BKA's.  Right BKA from 06/04/2023. Dressing changed today. No S&S of hematoma, seroma or infection. Left BKA prior Abdomen: Positive bowel sounds throughout, soft, nontender nondistended. Neurologic: AAOX4, answers questions and follows commands appropriately.  CBC    Component Value Date/Time   WBC 13.9 (H) 06/10/2023 0503   RBC 3.11 (L) 06/10/2023 0503   HGB 8.7 (L) 06/10/2023 0503   HGB 11.5 (L) 11/15/2014 0508   HCT 26.1 (L) 06/10/2023 0503   HCT 34.5 (L) 11/15/2014 0508   PLT 450 (H) 06/10/2023 0503   PLT 173 11/15/2014 0508   MCV 83.9 06/10/2023 0503   MCV 87 11/15/2014 0508   MCH 28.0 06/10/2023 0503   MCHC 33.3 06/10/2023 0503   RDW 17.2 (H) 06/10/2023 0503   RDW 14.0 11/15/2014 0508   LYMPHSABS 1.1 06/10/2023 0503   LYMPHSABS 1.4 11/15/2014 0508   MONOABS 0.6 06/10/2023 0503   MONOABS 0.6 11/15/2014 0508   EOSABS 0.2 06/10/2023 0503   EOSABS 0.0 11/15/2014 0508   BASOSABS 0.1 06/10/2023 0503   BASOSABS 0.1 11/15/2014 0508    BMET    Component Value Date/Time   NA 133 (L) 06/10/2023 0503   NA 135 11/15/2014 0508   K 4.0 06/10/2023 0503   K 4.1 11/15/2014 0508   CL  102 06/10/2023 0503   CL 108 11/15/2014 0508   CO2 26 06/10/2023 0503   CO2 26 11/15/2014 0508   GLUCOSE 187 (H) 06/10/2023 0503   GLUCOSE 167 (H) 11/15/2014 0508   BUN 25 (H) 06/10/2023 0503   BUN 26 (H) 11/15/2014 0508   CREATININE 1.33 (H) 06/10/2023 0503   CREATININE 0.91 11/15/2014 0508   CALCIUM 8.3 (L) 06/10/2023 0503   CALCIUM 8.0 (L) 11/15/2014 0508   GFRNONAA 58 (L) 06/10/2023 0503   GFRNONAA >60 11/15/2014 0508   GFRAA >60 11/15/2014 0508    INR    Component Value Date/Time   INR 1.3 (H) 05/28/2023 1535   INR 0.9 11/07/2014 0927     Intake/Output Summary (Last 24 hours) at 06/10/2023 1139 Last data filed at 06/10/2023 1128 Gross per 24 hour  Intake 120 ml  Output 925 ml  Net -805 ml     Assessment/Plan:  67 y.o. male is s/p right below the knee amputation  6 Days Post-Op   PLAN Per Vascular Surgery Okay for patient to discharge when medically appropriate.  Advance diet as tolerated. Pain medication as needed. Continue to work with PT/OT   Stretching Exercises Q 1 Hr while awake to right BKA to prevent contracture.  Dressing changes Daily. No drainage or complications to note.  Recovering as expected.   DVT prophylaxis:  ASA 81 mg daily, Plavix 75 mg daily.    Marcie [facility] 06/10/2023 11:39 AM

## 2023-06-10 NOTE — TOC Progression Note (Signed)
Transition of Care [facility]) - Progression Note    Patient Details  Name: Allen Chavez MRN: [id number] Date of Birth: 13-Apr-1956  Transition of Care [facility]) CM/SW Contact  Marlowe Sax, RN Phone Number: 06/10/2023, 2:00 PM  Clinical Narrative:    The patient will go to room [facility] at [facility], called daughter Elease Hashimoto and let her know of the DC and the room number Ems called and they added him to the transport list   Expected Discharge Plan: Skilled Nursing Facility Barriers to Discharge: SNF Pending bed offer, Insurance Authorization  Expected Discharge Plan and Services In-house Referral: Clinical Social Work Discharge Planning Services: Other - See comment (Pending)   Living arrangements for the past 2 months: Single Family Home Expected Discharge Date: 06/10/23               DME Arranged: N/A DME Agency: NA       HH Arranged: NA (Pending) HH Agency: NA (Pending)         Social Determinants of Health (SDOH) Interventions SDOH Screenings   Food Insecurity: No Food Insecurity (05/28/2023)  Housing: Low Risk  (05/28/2023)  Transportation Needs: No Transportation Needs (05/28/2023)  Utilities: Not At Risk (05/28/2023)  Financial Resource Strain: Patient Declined (09/30/2022)   Received from [facility], [facility]  Tobacco Use: High Risk (06/04/2023)    Readmission Risk Interventions    06/06/2023   11:28 AM  Readmission Risk Prevention Plan  Transportation Screening Complete  PCP or Specialist Appt within 3-5 Days Complete  Social Work Consult for Recovery Care Planning/Counseling Complete  Palliative Care Screening Not Applicable  Medication Review Oceanographer) Referral to Pharmacy

## 2023-06-10 NOTE — TOC Progression Note (Signed)
Transition of Care [facility]) - Progression Note    Patient Details  Name: Allen Chavez MRN: [id number] Date of Birth: 07-20-1956  Transition of Care [facility]) CM/SW Contact  Marlowe Sax, RN Phone Number: 06/10/2023, 1:45 PM  Clinical Narrative:     Ins approved [id number] 11/12-14  Expected Discharge Plan: Skilled Nursing Facility Barriers to Discharge: SNF Pending bed offer, Insurance Authorization  Expected Discharge Plan and Services In-house Referral: Clinical Social Work Discharge Planning Services: Other - See comment (Pending)   Living arrangements for the past 2 months: Single Family Home Expected Discharge Date: 06/10/23               DME Arranged: N/A DME Agency: NA       HH Arranged: NA (Pending) HH Agency: NA (Pending)         Social Determinants of Health (SDOH) Interventions SDOH Screenings   Food Insecurity: No Food Insecurity (05/28/2023)  Housing: Low Risk  (05/28/2023)  Transportation Needs: No Transportation Needs (05/28/2023)  Utilities: Not At Risk (05/28/2023)  Financial Resource Strain: Patient Declined (09/30/2022)   Received from [facility], [facility]  Tobacco Use: High Risk (06/04/2023)    Readmission Risk Interventions    06/06/2023   11:28 AM  Readmission Risk Prevention Plan  Transportation Screening Complete  PCP or Specialist Appt within 3-5 Days Complete  Social Work Consult for Recovery Care Planning/Counseling Complete  Palliative Care Screening Not Applicable  Medication Review Oceanographer) Referral to Pharmacy

## 2023-06-10 NOTE — TOC Progression Note (Signed)
Transition of Care [facility]) - Progression Note    Patient Details  Name: Allen Chavez MRN: [id number] Date of Birth: [birth year]  Transition of Care [facility]) CM/SW Contact  Marlowe Sax, RN Phone Number: 06/10/2023, 12:02 PM  Clinical Narrative:     Allen Chavez health called and wants to offer a peer to peer, Physician to call [phone] option [phone] , you need patient name, DOB and member ID number [id number], has to be done by 330 PM today or will be denied I notified the physician  Expected Discharge Plan: Skilled Nursing Facility Barriers to Discharge: SNF Pending bed offer, Insurance Authorization  Expected Discharge Plan and Services In-house Referral: Clinical Social Work Discharge Planning Services: Other - See comment (Pending)   Living arrangements for the past 2 months: Single Family Home                 DME Arranged: N/A DME Agency: NA       HH Arranged: NA (Pending) HH Agency: NA (Pending)         Social Determinants of Health (SDOH) Interventions SDOH Screenings   Food Insecurity: No Food Insecurity (05/28/2023)  Housing: Low Risk  (05/28/2023)  Transportation Needs: No Transportation Needs (05/28/2023)  Utilities: Not At Risk (05/28/2023)  Financial Resource Strain: Patient Declined (09/30/2022)   Received from [facility], [facility]  Tobacco Use: High Risk (06/04/2023)    Readmission Risk Interventions    06/06/2023   11:28 AM  Readmission Risk Prevention Plan  Transportation Screening Complete  PCP or Specialist Appt within 3-5 Days Complete  Social Work Consult for Recovery Care Planning/Counseling Complete  Palliative Care Screening Not Applicable  Medication Review Oceanographer) Referral to Pharmacy

## 2023-06-10 NOTE — Progress Notes (Addendum)
Refused all medication with the exception of Plavix, insulins, Fenofibrate, Prn oxy. MD notified.

## 2023-06-10 NOTE — Progress Notes (Signed)
Physical Therapy Treatment Patient Details Name: Allen Chavez MRN: [id number] DOB: [birth year] Today's Date: 06/10/2023   History of Present Illness Allen Chavez is a 67 y.o. male with medical history significant of insulin-dependent diabetes mellitus, hypertension, PAD and CKD stage IIIa presented to ED with generalized malaise and worsening right foot wound with concern of gangrene and surrounding erythema with tracking up to upper legs for the past 2 days.  Patient also endorsed some subjective fever and chills at home.  He was having generalized malaise and poor p.o. intake for the past couple of days. Pt is now R BKA.    PT Comments  Pt willing to participate during the session following cues, encouragement, and education; putting forth fair effort throughout. Pt declining all mobility including sitting EOB. Pt agreed to perform various bed level exercises with mixed use of resistance per below. Ultimately able to tolerate all exercises provided. Continued to provide extensive education and reinforcement of BLE resting positioning and HEP to prevent contracture. Pt will benefit from continued PT services upon discharge to safely address deficits listed in patient problem list for decreased caregiver assistance and eventual return to PLOF.      If plan is discharge home, recommend the following: A little help with bathing/dressing/bathroom;A little help with walking and/or transfers;Assist for transportation;Help with stairs or ramp for entrance   Can travel by private vehicle     No  Equipment Recommendations  Other (comment) (to be determined at next level of care)    Recommendations for Other Services       Precautions / Restrictions Precautions Precautions: Fall Restrictions Weight Bearing Restrictions: Yes RLE Weight Bearing: Non weight bearing LLE Weight Bearing: Non weight bearing Other Position/Activity Restrictions: L skin tear cannot don prosthesis, conservative NWB while  wound is healing     Mobility  Bed Mobility               General bed mobility comments: Pt declining OOB activity or bed mobility despite extensive education on need for mobility and transfers. Pt agreeing to varuious resisted and non-resisted bed exercises    Transfers                        Ambulation/Gait                   Stairs             Wheelchair Mobility     Tilt Bed    Modified Rankin (Stroke Patients Only)       Balance Overall balance assessment: Needs assistance     Sitting balance - Comments: untested today, pt declining sitting EOB       Standing balance comment: unable to perform                            Cognition Arousal: Alert Behavior During Therapy: WFL for tasks assessed/performed Overall Cognitive Status: Within Functional Limits for tasks assessed                                 General Comments: decreased safety awareness and awareness of need for physical assistance        Exercises Total Joint Exercises Quad Sets: AROM, 10 reps, Both, Strengthening Hip ABduction/ADduction: AROM, Strengthening, Both, 10 reps (resistance) Straight Leg Raises: AROM, Strengthening, Both, 10 reps Standing Hip Extension: AROM (  supine hips extension with towel resistance to back of hamstring) Other Exercises Other Exercises: Extensive education and re-education on benefits of mobilization and transfers, as well as positioning of BLE's to prevent contractures. Pt unable to fully extend R knee    General Comments        Pertinent Vitals/Pain Pain Assessment Pain Assessment: Faces Faces Pain Scale: Hurts even more Pain Location: R knee Pain Descriptors / Indicators: Discomfort, Sore, Grimacing, Guarding Pain Intervention(s): Monitored during session, Limited activity within patient's tolerance    Home Living                          Prior Function            PT Goals  (current goals can now be found in the care plan section) Progress towards PT goals: Progressing toward goals    Frequency    Min 1X/week      PT Plan      Co-evaluation              AM-PAC PT "6 Clicks" Mobility   Outcome Measure  Help needed turning from your back to your side while in a flat bed without using bedrails?: A Little Help needed moving from lying on your back to sitting on the side of a flat bed without using bedrails?: A Lot Help needed moving to and from a bed to a chair (including a wheelchair)?: A Lot Help needed standing up from a chair using your arms (e.g., wheelchair or bedside chair)?: Total Help needed to walk in hospital room?: Total Help needed climbing 3-5 steps with a railing? : Total 6 Click Score: 10    End of Session Equipment Utilized During Treatment: Gait belt Activity Tolerance: Patient tolerated treatment well Patient left: in bed;with call bell/phone within reach;with bed alarm set Nurse Communication: Mobility status;Patient requests pain meds PT Visit Diagnosis: Other abnormalities of gait and mobility (R26.89);Pain;Muscle weakness (generalized) (M62.81) Pain - Right/Left: Right Pain - part of body: Leg     Time: 1610-9604 PT Time Calculation (min) (ACUTE ONLY): 24 min  Charges:                            Cecile Sheerer, SPT 06/10/23, 4:19 PM

## 2023-06-10 NOTE — TOC Progression Note (Signed)
Transition of Care [facility]) - Progression Note    Patient Details  Name: Allen Chavez MRN: [id number] Date of Birth: 01/03/1956  Transition of Care [facility]) CM/SW Contact  Marlowe Sax, RN Phone Number: 06/10/2023, 10:52 AM  Clinical Narrative:    [place] [facility] health Portal, Ins is pending to go to [facility] care   Expected Discharge Plan: Skilled Nursing Facility Barriers to Discharge: SNF Pending bed offer, Insurance Authorization  Expected Discharge Plan and Services In-house Referral: Clinical Social Work Discharge Planning Services: Other - See comment (Pending)   Living arrangements for the past 2 months: Single Family Home                 DME Arranged: N/A DME Agency: NA       HH Arranged: NA (Pending) HH Agency: NA (Pending)         Social Determinants of Health (SDOH) Interventions SDOH Screenings   Food Insecurity: No Food Insecurity (05/28/2023)  Housing: Low Risk  (05/28/2023)  Transportation Needs: No Transportation Needs (05/28/2023)  Utilities: Not At Risk (05/28/2023)  Financial Resource Strain: Patient Declined (09/30/2022)   Received from [facility], [facility]  Tobacco Use: High Risk (06/04/2023)    Readmission Risk Interventions    06/06/2023   11:28 AM  Readmission Risk Prevention Plan  Transportation Screening Complete  PCP or Specialist Appt within 3-5 Days Complete  Social Work Consult for Recovery Care Planning/Counseling Complete  Palliative Care Screening Not Applicable  Medication Review Oceanographer) Referral to Pharmacy

## 2023-06-11 DIAGNOSIS — A419 Sepsis, unspecified organism: Secondary | ICD-10-CM | POA: Diagnosis not present

## 2023-06-11 DIAGNOSIS — R6520 Severe sepsis without septic shock: Secondary | ICD-10-CM | POA: Diagnosis not present

## 2023-06-11 LAB — GLUCOSE, CAPILLARY
Glucose-Capillary: 157 mg/dL — ABNORMAL HIGH (ref 70–99)
Glucose-Capillary: 194 mg/dL — ABNORMAL HIGH (ref 70–99)

## 2023-06-11 NOTE — Progress Notes (Signed)
Nutrition Follow-up  DOCUMENTATION CODES:   Not applicable  INTERVENTION:   -Continue MVI with minerals daily -D/c 1 packet Juven BID, each packet provides 95 calories, 2.5 grams of protein (collagen), and 9.8 grams of carbohydrate (3 grams sugar); also contains 7 grams of L-arginine and L-glutamine, 300 mg vitamin C, 15 mg vitamin E, 1.2 mcg vitamin B-12, 9.5 mg zinc, 200 mg calcium, and 1.5 g  Calcium Beta-hydroxy-Beta-methylbutyrate to support wound healing  -Continue 500 mg vitamin C BID -Continue 220 mg zinc sulfate daily -Continue double protein portions with meals  NUTRITION DIAGNOSIS:   Increased nutrient needs related to post-op healing as evidenced by estimated needs.  Ongoing  GOAL:   Patient will meet greater than or equal to 90% of their needs  Progressing   MONITOR:   PO intake, Supplement acceptance  REASON FOR ASSESSMENT:   Malnutrition Screening Tool    ASSESSMENT:   Pt with medical history significant of insulin-dependent diabetes mellitus, hypertension, PAD and CKD stage IIIa presented with generalized malaise and worsening right foot wound with concern of gangrene and surrounding erythema with tracking up to upper legs for the past 2 days PTA.  10/31- aortogram 11/2- s/p Right foot transmetatarsal amputation  11/6- s/p Right below-the-knee amputation   Reviewed I/O's: -180 ml x 24 hours and -4.3 L since admission  UOP: 300 ml x 24 hours  Pt sleeping soundly at time of visit. He did not respond to voice. Meal tray delivered; untouched. No family at bedside.   Pt with good appetite. Noted meal completions 75-100%. Pt is refusing Juven supplements.   Per TOC notes, pt has bed at [facility] but awaiting BM for discharge. Pt has refused medications at times.   Medications reviewed and include vitamin C, lactulose, melatonin, MVI, miralax, and zinc sulfate.   Labs reviewed: Na: 133, Phos: 2.0, CBGS: 157-258 (inpatient orders for  glycemic control are 0-15 units insulin aspart TID with meal,s 0-5 units insulin aspart daily at bedtime, 5 units insulin aspart TID with meals, and 10 units insulin glargine-yfgn BID).    NUTRITION - FOCUSED PHYSICAL EXAM:  Flowsheet Row Most Recent Value  Orbital Region Mild depletion  Upper Arm Region No depletion  Thoracic and Lumbar Region No depletion  Buccal Region No depletion  Temple Region Mild depletion  Clavicle Bone Region No depletion  Clavicle and Acromion Bone Region No depletion  Scapular Bone Region No depletion  Dorsal Hand No depletion  Patellar Region No depletion  Anterior Thigh Region No depletion  Posterior Calf Region Unable to assess  Edema (RD Assessment) Mild  Hair Reviewed  Eyes Reviewed  Mouth Reviewed  Skin Reviewed  Nails Reviewed       Diet Order:   Diet Order             Diet - low sodium heart healthy           Diet Carb Modified           Diet regular Room service appropriate? Yes; Fluid consistency: Thin  Diet effective now                   EDUCATION NEEDS:   No education needs have been identified at this time  Skin:  Skin Assessment: Skin Integrity Issues: Skin Integrity Issues:: Incisions Incisions: s/p rt BKA  Last BM:  06/03/23 (type 7)  Height:   Ht Readings from Last 1 Encounters:  05/28/23 6\' 2"  (1.88 m)    Weight:  Wt Readings from Last 1 Encounters:  06/11/23 75.7 kg    Ideal Body Weight:  75.6 kg (adjusted for bilteral BKAs)  BMI:  Body mass index is 21.43 kg/m.  Estimated Nutritional Needs:   Kcal:  2200-2400  Protein:  120-135 grams  Fluid:  > 2 L    Levada Schilling, RD, LDN, CDCES Registered Dietitian III Certified Diabetes Care and Education Specialist Please refer to [facility] for RD and/or RD on-call/weekend/after hours pager

## 2023-06-11 NOTE — Care Management Important Message (Signed)
Important Message  Patient Details  Name: Allen Chavez MRN: [id number] Date of Birth: 1956-04-24   Important Message Given:  Yes - Medicare IM     [name] 06/11/2023, 10:30 AM

## 2023-06-11 NOTE — Plan of Care (Signed)
Progressing toward set goal

## 2023-06-11 NOTE — TOC Progression Note (Signed)
Transition of Care [facility]) - Progression Note    Patient Details  Name: Allen Chavez MRN: [id number] Date of Birth: 1955/10/03  Transition of Care [facility]) CM/SW Contact  Marlowe Sax, RN Phone Number: 06/11/2023, 10:57 AM  Clinical Narrative:     Patient to Discharge to [facility] Room 64, EMS called to transport Called daughter to notify    Expected Discharge Plan: Skilled Nursing Facility Barriers to Discharge: SNF Pending bed offer, Insurance Authorization  Expected Discharge Plan and Services In-house Referral: Clinical Social Work Discharge Planning Services: Other - See comment (Pending)   Living arrangements for the past 2 months: Single Family Home Expected Discharge Date: 06/11/23               DME Arranged: N/A DME Agency: NA       HH Arranged: NA (Pending) HH Agency: NA (Pending)         Social Determinants of Health (SDOH) Interventions SDOH Screenings   Food Insecurity: No Food Insecurity (05/28/2023)  Housing: Low Risk  (05/28/2023)  Transportation Needs: No Transportation Needs (05/28/2023)  Utilities: Not At Risk (05/28/2023)  Financial Resource Strain: Patient Declined (09/30/2022)   Received from [facility], [facility]  Tobacco Use: High Risk (06/04/2023)    Readmission Risk Interventions    06/06/2023   11:28 AM  Readmission Risk Prevention Plan  Transportation Screening Complete  PCP or Specialist Appt within 3-5 Days Complete  Social Work Consult for Recovery Care Planning/Counseling Complete  Palliative Care Screening Not Applicable  Medication Review Oceanographer) Referral to Pharmacy

## 2023-06-11 NOTE — Discharge Summary (Signed)
Physician Discharge Summary   Patient: Allen Chavez MRN: [id number] DOB: 1956-01-18  Admit date:     05/28/2023  Discharge date: 06/11/23  Discharge Physician: Lurene Shadow   PCP: [name], MD   Recommendations at discharge:    Follow-up with Dr. Wyn Quaker, vascular surgeon in about 2 weeks Follow-up with physician at the nursing home within 3 days of discharge  Discharge Diagnoses: Principal Problem:   Severe sepsis [facility]) Active Problems:   AKI (acute kidney injury) (HCC)   Peripheral arterial disease (HCC)   Type 2 diabetes mellitus (HCC)   Essential (primary) hypertension   Hyperlipidemia   GERD (gastroesophageal reflux disease)   Malignant neoplasm of prostate (HCC)   Gangrene of right foot (HCC)   Necrotizing subcutaneous infection (HCC)   Elevated TSH   Normocytic anemia   Acute blood loss as cause of postoperative anemia  Resolved Problems:   * No resolved hospital problems. *  Hospital Course: Allen Chavez is a 67 y.o. male with medical history significant of insulin-dependent diabetes mellitus, hypertension, PAD and CKD stage IIIa presented to ED with generalized malaise and worsening right foot wound with concern of gangrene and surrounding erythema with tracking up to upper legs for about 2 days prior to admission.  Patient also endorsed some subjective fever and chills at home.  He also had poor oral intake.     On presentation  Patient was afebrile, tachycardic at 112 and softer blood pressure 94/52.  Labs pertinent for leukocytosis at 18.1, absolute neutrophil 15.8, hemoglobin 11.5, lactic acid 3.7, sodium 133, CO2 21, glucose 157, BUN 45, creatinine 3.24 with GFR of 20, baseline seems to be around 1.4-1.5.  CXR with no active cardiopulmonary disease. Right foot imaging with asymmetric soft tissue swelling around fifth toe with some probable small amount of air in the soft tissue.  Also noted to have a small cortical breach in the distal phalanx, concerning for  acute osteomyelitis.   EKG shows sinus tachycardia without acute ST-T changes   Patient met severe sepsis criteria and blood cultures were drawn.  Received IV fluid and started on broad-spectrum antibiotics.  Vascular surgery and podiatry was consulted.   10/31: Afebrile this morning, maximum temperature recorded of 100.5 overnight.  Lactic acidosis resolved, as some improvement in leukocytosis and hemoglobin decreased to 9.3, all cell line decreased so likely some dilutional effect.  Significantly elevated CRP at 27.3, ESR 81, A1c 7.1, TSH elevated at 12.732, checking thyroid panel.  Preliminary blood cultures negative.  MRI of right foot with concern of osteo of fifth toe, no discrete abscess.  Going for angiography with vascular today, followed by likely transmetatarsal amputation on Friday or Saturday Hypoglycemia as patient was n.p.o., switching fluids to LR with D5   11/1: S/p angiography and stents placement in right lower extremity.  Transmetatarsal amputation scheduled for tomorrow.  Creatinine slowly improving, at 1.92 today, improving leukocytosis And stable hemoglobin.  Anemia panel consistent with anemia of chronic disease with some iron and B12 deficiency.  B12 at 177.  Ordered IM replacement and also starting on supplement.   11/2: Patient had his right transmetatarsal amputation earlier today, tolerated the procedure well. Thyroid panel with some improvement in TSH to 7, increased T3 uptake but normal T4 and T3. Will need a repeat in 3 to 4 weeks by PCP.   11/3: Vital stable, creatinine continue to slowly improve, at 1.67 today.  Persistent leukocytosis likely some reactive aliment as patient had his TMA yesterday.  Preliminary wound  culture with no organism but patient was already on antibiotics.  Pending PT/OT evaluation   11/4: Podiatry is concerned about a spot at his stump, will be high risk for BKA due to decreased blood supply.  PT is recommending SNF.  Cultures are growing  group C strep, antibiotics switched with Unasyn.   11/5: Patient with failing TMA flap, vascular surgery was reconsulted and patient will be going for BKA on right, likely tomorrow depending OR availability.    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  S/p right foot transmetatarsal amputation on 05/31/2023.  Right foot deep wound cultures from 05/31/2023 showed rare Streptococcus group G.  No growth on blood cultures. Leukocytosis has improved.  IV Unasyn was discontinued on 06/05/2023. Continue analgesics as needed for pain. Lactic acid was 3.7 on admission.  He was treated with IV fluids and IV antibiotics.     Peripheral vascular disease: S/p percutaneous transluminal angioplasty and stent placement to right SFA and popliteal arteries on 05/29/2023 History of left BKA many years ago Continue Lipitor, aspirin and Plavix.  Continue local wound care and stretching exercises.  Follow-up with vascular surgeon as an outpatient.     Acute blood loss anemia, normocytic anemia: Hemoglobin improved from 7.2-8.7. S/p transfusion with 1 unit of PRBCs on 06/05/2023.   Vitamin B12 deficiency (vitamin B12 177): Continue vitamin B12 supplement. Ferritin 510, iron 15, iron saturation ratio 7, TIBC 218.     AKI on CKD stage IIIa : Improved.   Creatinine was 3.24 on admission.     Hypertension: Resume lisinopril at discharge     Type II DM with hyperglycemia, s/p hypoglycemia on 06/07/2023:  Resume Lantus but at a lower dose.  Patient will be discharged on Lantus 30 units daily.  NovoLog has been discontinued to simplify insulin regimen. Monitor glucose closely and adjust insulin as needed.  NovoLog can be reintroduced depending on glucose levels. He was taking Lantus 60 units daily and NovoLog 2 to 10 units with meals at home, prior to admission. Hemoglobin A1c 7.7 in September 2024     Elevated TSH: Initial TSH was 12.732 on 05/28/2023.  Repeat TSH  was 7.430 on 05/29/2023.  Free T4 was normal.  This could be subclinical hypothyroidism.  Repeat thyroid function tests as an outpatient in 4 to 6 weeks       Other comorbidities include hyperlipidemia, GERD     His condition has improved and he is deemed stable for discharge to SNF today. He's agreeable with the discharge plan.         Pain control - [employer] Controlled Substance Reporting System database was reviewed. and patient was instructed, not to drive, operate heavy machinery, perform activities at heights, swimming or participation in water activities or provide baby-sitting services while on Pain, Sleep and Anxiety Medications; until their outpatient Physician has advised to do so again. Also recommended to not to take more than prescribed Pain, Sleep and Anxiety Medications.  Consultants: Vascular surgeon, podiatrist  Procedures performed:   Right BKA on 06/04/2023 Right foot transmetatarsal amputation 05/31/2023 Percutaneous transluminal angioplasty and stent placement to right lower extremity popliteal and right SFA arteries  Disposition: Skilled nursing facility Diet recommendation:  Discharge Diet Orders (From admission, onward)     Start     Ordered   06/10/23 0000  Diet - low sodium heart healthy        06/10/23 1339   06/10/23 0000  Diet Carb  Modified        06/10/23 1339           Cardiac and Carb modified diet DISCHARGE MEDICATION: Allergies as of 06/11/2023   No Known Allergies      Medication List     STOP taking these medications    Insulin Aspart FlexPen 100 UNIT/ML Commonly known as: NOVOLOG       TAKE these medications    acetaminophen 325 MG tablet Commonly known as: TYLENOL Take 2 tablets (650 mg total) by mouth every 6 (six) hours as needed for mild pain (pain score 1-3) (or Fever >/= 101).   aspirin EC 81 MG tablet Take 1 tablet (81 mg total) by mouth daily. Swallow whole.   atorvastatin 40 MG tablet Commonly known as:  LIPITOR Take 40 mg by mouth daily.   clopidogrel 75 MG tablet Commonly known as: PLAVIX Take 1 tablet (75 mg total) by mouth daily.   fenofibrate 160 MG tablet Take 1 tablet by mouth daily.   insulin glargine 100 UNIT/ML Solostar Pen Commonly known as: LANTUS Inject 30 Units into the skin daily. What changed: how much to take   lisinopril 20 MG tablet Commonly known as: ZESTRIL Take 1 tablet by mouth daily.   omeprazole 20 MG capsule Commonly known as: PRILOSEC Take by mouth.   oxyCODONE 5 MG immediate release tablet Commonly known as: Oxy IR/ROXICODONE Take 1 tablet (5 mg total) by mouth every 8 (eight) hours as needed for moderate pain (pain score 4-6).   polyethylene glycol 17 g packet Commonly known as: MIRALAX / GLYCOLAX Take 17 g by mouth daily as needed.               Discharge Care Instructions  (From admission, onward)           Start     Ordered   06/10/23 0000  Discharge wound care:       Comments: Right BKA stump wound: Xeroform to staple line x 2, cover with dry gauze, cover with 2 ABD pads, wrapped with Kerlix x 2, wrapped with Ace bandage snugly to help reduce swelling.   06/10/23 1339            Contact information for follow-up providers     Dew, Marlow Baars, MD Follow up in 2 week(s).   Specialties: Vascular Surgery, Radiology, Interventional Cardiology Why: 330p Contact information: [address] [place] [address] [phone]              Contact information for after-discharge care     Destination     [facility] .   Service: Skilled Nursing Contact information: [address] [place] [address] [phone]                    Discharge Exam: Allen Chavez Weights   06/07/23 0500 06/10/23 0500 06/11/23 0525  Weight: 79.6 kg 75.4 kg 75.7 kg   GEN: NAD SKIN: Warm and dry EYES: No pallor or icterus ENT: MMM CV: RRR PULM: CTA B ABD: soft, ND, NT,  +BS CNS: AAO x 3, non focal EXT: Dressing on right BKA stump wound is clean, dry and intact. Old left BKA   Condition at discharge: good  The results of significant diagnostics from this hospitalization (including imaging, microbiology, ancillary and laboratory) are listed below for reference.   Imaging Studies: DG Foot Complete Right  Result Date: 05/31/2023 CLINICAL DATA:  Osteomyelitis post transmetatarsal amputation EXAM:  RIGHT FOOT COMPLETE - 3+ VIEW COMPARISON:  Radiographs 05/28/2023.  MRI 05/28/2023. FINDINGS: Patient has undergone interval amputation through the bases of all of the metatarsals. Stable mild midfoot degenerative changes, primarily at the talonavicular joint. Skin staples are in place. There is a small amount of air within the distal soft tissues attributed to the recent surgery. Scattered vascular calcifications noted. IMPRESSION: Interval transmetatarsal amputation as described. No acute osseous findings. Electronically Signed   By: Carey Bullocks M.D.   On: 05/31/2023 13:57   US ARTERIAL ABI (SCREENING LOWER EXTREMITY)  Result Date: 05/30/2023 CLINICAL DATA:  Gangrene of the distal right foot. History of diabetes and hypertension. Prior left lower extremity below-knee amputation. EXAM: NONINVASIVE PHYSIOLOGIC VASCULAR STUDY OF BILATERAL LOWER EXTREMITIES TECHNIQUE: Evaluation of both lower extremities were performed at rest, including calculation of ankle-brachial indices with single level Doppler, pressure and pulse volume recording. COMPARISON:  None Available. FINDINGS: Right ABI:  0.58 Left ABI:  N/A Right Lower Extremity: The posterior tibial artery waveform is monophasic. A waveform cannot be detected at the level of the dorsalis pedis artery. 0.5-0.79 Moderate PAD IMPRESSION: 1. Right ankle-brachial index of 0.58 consistent with moderate arterial insufficiency. Monophasic waveform of the right posterior tibial artery. A waveform cannot be detected at the level of the  dorsalis pedis artery. 2. Prior left lower extremity below-knee amputation. Electronically Signed   By: Irish Lack M.D.   On: 05/30/2023 08:13   US RENAL  Result Date: 05/29/2023 CLINICAL DATA:  Acute kidney injury EXAM: RENAL / URINARY TRACT ULTRASOUND COMPLETE COMPARISON:  None Available. FINDINGS: Right Kidney: Renal measurements: 12 x 6.8 x 6.2 cm = volume: 264.6 mL. Echogenicity within normal limits. No mass or hydronephrosis visualized. Left Kidney: Renal measurements: 11.5 x 5.7 x 5 cm = volume: 171.4 mL. Echogenicity normal. Slight prominence of the left renal pelvis but without convincing hydronephrosis or calyceal enlargement. Bladder: Appears normal for degree of bladder distention. Other: None. IMPRESSION: Negative renal ultrasound. Electronically Signed   By: Jasmine Pang M.D.   On: 05/29/2023 20:58   PERIPHERAL VASCULAR CATHETERIZATION  Result Date: 05/29/2023 See surgical note for result.  MR FOOT RIGHT WO CONTRAST  Result Date: 05/28/2023 CLINICAL DATA:  Necrotizing infection to dorsal portion of distal right foot with cellulitis extending up the leg. Further evaluation for depth of invasion as well as potential surgical access. EXAM: MRI OF THE RIGHT FOREFOOT WITHOUT CONTRAST TECHNIQUE: Multiplanar, multisequence MR imaging of the right forefoot was performed. No intravenous contrast was administered. COMPARISON:  right foot radiographs 05/28/2023, MRI right foot 06/15/2009 FINDINGS: Bones/Joint/Cartilage There is mild marrow edema on the sesamoid greater than great toe metatarsal head side of the medial great toe plantar sesamoid hallux articulation (sagittal series 8, image 31), likely degenerative. There is mildly increased STIR signal within the middle phalanx of the third toe. This is also seen on the coronal T2 fat saturation and axial T2 fat saturation images, however there is inhomogeneous fat saturation on those sequences, and artifactual increased T2 signal and other  areas making this sequences less reliable for marrow edema. There is mild-to-moderate marrow edema within the fifth metatarsal head (sagittal series 8, images 9-11). There is moderate extension positioning of the fifth metatarsophalangeal joint, similar to remote 06/15/2009 MRI. Within the limitations of inhomogeneous fat saturation and increased T2 signal artifact, there may be mildly increased T2/STIR signal within the middle and distal phalanges of the fifth toe. Given the suspected cellulitis in the fifth toe, this remains suspicious  for early acute osteomyelitis. No definitive cortical erosion is identified. Ligaments The Lisfranc ligament complex is intact. The proximal phalangeal insertion of the fifth metatarsophalangeal medial collateral ligament is not well visualized and is difficult to exclude a tear. Muscles and Tendons No tendon tear is seen. There is nonspecific moderate edema seen throughout the flexor digitorum brevis greater than extensor digitorum brevis musculature. Soft tissues There is mild-to-moderate soft tissue edema and swelling about the fifth toe and fifth metatarsophalangeal joint, as seen on today's radiographs. This is greatest at the dorsal aspect of the junction between the base of the fourth and fifth toes. Mild subcutaneous fat edema and swelling of the dorsal midfoot. No walled-off abscess is identified. IMPRESSION: 1. Mild-to-moderate soft tissue edema and swelling about the fifth toe and fifth metatarsophalangeal joint, as seen on today's radiographs. No walled-off abscess is identified. 2. There is mild-to-moderate marrow edema within the fifth metatarsal head. There may be mildly increased T2/STIR signal within the middle and distal phalanges of the fifth toe. Given the suspected cellulitis in the fifth toe, this remains suspicious for early acute osteomyelitis. No definitive cortical erosion is identified. 3. There is mildly increased STIR signal within the middle phalanx of  the third toe. This is favored to be artifactual, due to inhomogeneous fat suppression. Recommend clinical correlation for concerns of infection within the third toe. 4. There is nonspecific moderate edema seen throughout the flexor digitorum brevis greater than extensor digitorum brevis musculature. Electronically Signed   By: Neita Garnet M.D.   On: 05/28/2023 21:19   DG Foot Complete Right  Result Date: 05/28/2023 CLINICAL DATA:  large ulcer to dorsal distal right foot involving 5th digit with discoloration, evaluting for bone involvement or subQ air EXAM: RIGHT FOOT COMPLETE - 3+ VIEW COMPARISON:  None Available. FINDINGS: No acute fracture or dislocation. There is asymmetric soft tissue swelling around the fifth toe and subtle lucency seen on the AP and oblique projections favoring probable small amount of air. There is small area of probable cortical breach in the tuft of the distal phalanx. Findings are concerning for acute osteomyelitis. Correlate clinically to determine the need for additional imaging with more sensitive modality such as MRI. Ankle mortise appears intact. No radiopaque foreign bodies. IMPRESSION: *Asymmetric soft tissue swelling around the fifth toe with probable small amount of air in the soft tissue. There is small area of probable cortical breach in the tuft of the distal phalanx. Findings are concerning for acute osteomyelitis. Correlate clinically to determine the need for additional imaging with more sensitive modality such as MRI. Electronically Signed   By: Jules Schick M.D.   On: 05/28/2023 17:11   DG Chest 2 View  Result Date: 05/28/2023 CLINICAL DATA:  Wound on the foot which is infected. Suspected sepsis. EXAM: CHEST - 2 VIEW COMPARISON:  10/03/2010. FINDINGS: Low lung volume. Bilateral lung fields are clear. Elevated right hemidiaphragm noted. Bilateral costophrenic angles are clear. Normal cardio-mediastinal silhouette. No acute osseous abnormalities. The soft  tissues are within normal limits. IMPRESSION: *No active cardiopulmonary disease. Electronically Signed   By: Jules Schick M.D.   On: 05/28/2023 17:06    Microbiology: Results for orders placed or performed during the hospital encounter of 05/28/23  Culture, blood (Routine x 2)     Status: None   Collection Time: 05/28/23  3:40 PM   Specimen: BLOOD  Result Value Ref Range Status   Specimen Description BLOOD BLOOD RIGHT ARM  Final   Special Requests  Final    BOTTLES DRAWN AEROBIC AND ANAEROBIC Blood Culture results may not be optimal due to an excessive volume of blood received in culture bottles   Culture   Final    NO GROWTH 5 DAYS Performed at [facility], [address] [place], [place] [address]    Report Status 06/02/2023 FINAL  Final  Culture, blood (Routine x 2)     Status: None   Collection Time: 05/28/23  4:30 PM   Specimen: BLOOD  Result Value Ref Range Status   Specimen Description BLOOD LEFT ANTECUBITAL  Final   Special Requests   Final    BOTTLES DRAWN AEROBIC AND ANAEROBIC Blood Culture adequate volume   Culture   Final    NO GROWTH 5 DAYS Performed at [facility], [address], [place], [place] [address]    Report Status 06/02/2023 FINAL  Final  Surgical PCR screen     Status: None   Collection Time: 05/31/23 12:00 AM   Specimen: Nasal Mucosa; Nasal Swab  Result Value Ref Range Status   MRSA, PCR NEGATIVE NEGATIVE Final   Staphylococcus aureus NEGATIVE NEGATIVE Final    Comment: (NOTE) The Xpert SA Assay (FDA approved for NASAL specimens in patients 5 years of age and older), is one component of a comprehensive surveillance program. It is not intended to diagnose infection nor to guide or monitor treatment. Performed at [facility], [address] [place], [place] [address]   Aerobic/Anaerobic Culture w Gram Stain (surgical/deep wound)     Status: None   Collection Time: 05/31/23  9:07 AM   Specimen: Wound; Tissue   Result Value Ref Range Status   Specimen Description   Final    WOUND Performed at [facility], [address], [place], [place] [address]    Special Requests   Final    NONE Performed at [facility], [address] [place], [place] [address]    Gram Stain NO WBC SEEN NO ORGANISMS SEEN   Final   Culture   Final    RARE STREPTOCOCCUS GROUP G Beta hemolytic streptococci are predictably susceptible to penicillin and other beta lactams. Susceptibility testing not routinely performed. NO ANAEROBES ISOLATED Performed at [facility], [address] [place], [place] [address]    Report Status 06/05/2023 FINAL  Final  Aerobic/Anaerobic Culture w Gram Stain (surgical/deep wound)     Status: None   Collection Time: 05/31/23  9:12 AM   Specimen: Wound; Tissue  Result Value Ref Range Status   Specimen Description   Final    WOUND Performed at [facility], [address] [place], [place] [address]    Special Requests   Final    NONE Performed at [facility], [address] [place], [place] [address]    Gram Stain NO WBC SEEN NO ORGANISMS SEEN   Final   Culture   Final    RARE STREPTOCOCCUS GROUP G Beta hemolytic streptococci are predictably susceptible to penicillin and other beta lactams. Susceptibility testing not routinely performed. NO ANAEROBES ISOLATED Performed at [facility], [address] [place], [place] [address]    Report Status 06/05/2023 FINAL  Final    Labs: CBC: Recent Labs  Lab 06/05/23 0522 06/06/23 0224 06/08/23 0532 06/10/23 0503  WBC 11.8* 12.2* 11.3* 13.9*  NEUTROABS  --  9.1* 8.9* 11.8*  HGB 7.2* 8.3* 8.6* 8.7*  HCT 20.9* 24.2* 25.4* 26.1*  MCV 80.1 81.8 83.6  83.9  PLT 441* 430* 433* 450*   Basic Metabolic Panel: Recent Labs  Lab 06/05/23 0522 06/08/23 0532 06/10/23 0503  NA 135 132* 133*  K 4.1 4.2 4.0  CL 107 104 102  CO2 22 24 26   GLUCOSE 259* 222* 187*  BUN 31* 35* 25*   CREATININE 1.27* 1.39* 1.33*  CALCIUM 7.6* 8.0* 8.3*   Liver Function Tests: No results for input(s): "AST", "ALT", "ALKPHOS", "BILITOT", "PROT", "ALBUMIN" in the last 168 hours. CBG: Recent Labs  Lab 06/09/23 2205 06/10/23 0744 06/10/23 1128 06/10/23 1647 06/10/23 2034  GLUCAP 149* 187* 216* 258* 194*    Discharge time spent: less than 30 minutes.  Signed: Lurene Shadow, MD [facility] 06/11/2023

## 2023-06-24 ENCOUNTER — Ambulatory Visit (INDEPENDENT_AMBULATORY_CARE_PROVIDER_SITE_OTHER): Payer: Medicare PPO | Admitting: Vascular Surgery

## 2023-06-24 ENCOUNTER — Encounter (INDEPENDENT_AMBULATORY_CARE_PROVIDER_SITE_OTHER): Payer: Self-pay | Admitting: Vascular Surgery

## 2023-06-24 VITALS — BP 95/61 | HR 93 | Resp 18 | Wt 166.0 lb

## 2023-06-24 DIAGNOSIS — Z794 Long term (current) use of insulin: Secondary | ICD-10-CM

## 2023-06-24 DIAGNOSIS — I10 Essential (primary) hypertension: Secondary | ICD-10-CM

## 2023-06-24 DIAGNOSIS — E785 Hyperlipidemia, unspecified: Secondary | ICD-10-CM

## 2023-06-24 DIAGNOSIS — E1152 Type 2 diabetes mellitus with diabetic peripheral angiopathy with gangrene: Secondary | ICD-10-CM

## 2023-06-24 DIAGNOSIS — I96 Gangrene, not elsewhere classified: Secondary | ICD-10-CM

## 2023-06-24 NOTE — Assessment & Plan Note (Signed)
Now status post right BKA.  About half the staples were removed today.  Return in about 2 weeks to remove the remainder of staples and recheck the wound.

## 2023-06-24 NOTE — Assessment & Plan Note (Signed)
blood glucose control important in reducing the progression of atherosclerotic disease. Also, involved in wound healing. On appropriate medications.  

## 2023-06-24 NOTE — Assessment & Plan Note (Signed)
lipid control important in reducing the progression of atherosclerotic disease. Continue statin therapy
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
## 2023-06-24 NOTE — Assessment & Plan Note (Signed)
blood pressure control important in reducing the progression of atherosclerotic disease. On appropriate oral medications.

## 2023-07-09 ENCOUNTER — Encounter (INDEPENDENT_AMBULATORY_CARE_PROVIDER_SITE_OTHER): Payer: Self-pay | Admitting: Nurse Practitioner

## 2023-07-09 ENCOUNTER — Ambulatory Visit (INDEPENDENT_AMBULATORY_CARE_PROVIDER_SITE_OTHER): Payer: Medicare PPO | Admitting: Nurse Practitioner

## 2023-07-09 VITALS — BP 89/57 | HR 80 | Resp 16

## 2023-07-09 DIAGNOSIS — Z89511 Acquired absence of right leg below knee: Secondary | ICD-10-CM

## 2023-07-10 ENCOUNTER — Telehealth (INDEPENDENT_AMBULATORY_CARE_PROVIDER_SITE_OTHER): Payer: Self-pay

## 2023-07-10 NOTE — Telephone Encounter (Signed)
I attempted to contact the patient to schedule him for a right BKA debridement with Dr. Wyn Quaker. A message was left for a return call.

## 2023-07-14 ENCOUNTER — Telehealth (INDEPENDENT_AMBULATORY_CARE_PROVIDER_SITE_OTHER): Payer: Self-pay

## 2023-07-14 ENCOUNTER — Encounter (INDEPENDENT_AMBULATORY_CARE_PROVIDER_SITE_OTHER): Payer: Self-pay | Admitting: Nurse Practitioner
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
## 2023-07-14 NOTE — Telephone Encounter (Signed)
I attempted to contact the patient and a message was left on the mobile phone for a return call to schedule a right BKA debridement with Dr. Wyn Quaker.

## 2023-07-16 ENCOUNTER — Telehealth (INDEPENDENT_AMBULATORY_CARE_PROVIDER_SITE_OTHER): Payer: Self-pay

## 2023-07-16 NOTE — Telephone Encounter (Signed)
I attempted to contact the patient to get him scheduled for a right BKA debridement with Dr. Wyn Quaker. Patient answered the phone and as I was explaining who I was and where I was calling from he kept saying hello then hung up. I then attempted to make contact with his emergency contacts and the phone lines were either voicemail box full or just kept ringing. So no contact made with anyone.

## 2023-07-18 ENCOUNTER — Telehealth (INDEPENDENT_AMBULATORY_CARE_PROVIDER_SITE_OTHER): Payer: Self-pay | Admitting: Vascular Surgery

## 2023-07-18 NOTE — Telephone Encounter (Signed)
Allen Chavez at [facility] called to schedule surgery with Dr. Wyn Quaker for patient. Patient is currently a resident at [facility].

## 2023-07-29 ENCOUNTER — Telehealth (INDEPENDENT_AMBULATORY_CARE_PROVIDER_SITE_OTHER): Payer: Self-pay

## 2023-07-29 NOTE — Telephone Encounter (Signed)
 Spoke with Allen Chavez at [facility] and the patient has been scheduled for a right BKA debridement on 08/06/23 at the [facility] Pre-op is on 08/05/23 at 1:00 pm at the [facility]. Pre-surgical instructions were faxed to attention Tasha at [facility].

## 2023-08-04 ENCOUNTER — Other Ambulatory Visit (INDEPENDENT_AMBULATORY_CARE_PROVIDER_SITE_OTHER): Payer: Self-pay | Admitting: Nurse Practitioner

## 2023-08-04 DIAGNOSIS — Z89511 Acquired absence of right leg below knee: Secondary | ICD-10-CM

## 2023-08-05 ENCOUNTER — Other Ambulatory Visit: Payer: Self-pay

## 2023-08-05 ENCOUNTER — Encounter
Admission: RE | Admit: 2023-08-05 | Discharge: 2023-08-05 | Disposition: A | Discharge status: Home / Self Care | Payer: Medicare PPO | Source: Ambulatory Visit | Attending: Vascular Surgery | Admitting: Vascular Surgery

## 2023-08-05 DIAGNOSIS — Z01812 Encounter for preprocedural laboratory examination: Secondary | ICD-10-CM | POA: Insufficient documentation

## 2023-08-05 DIAGNOSIS — Z794 Long term (current) use of insulin: Secondary | ICD-10-CM

## 2023-08-05 DIAGNOSIS — Z89511 Acquired absence of right leg below knee: Secondary | ICD-10-CM | POA: Insufficient documentation

## 2023-08-05 LAB — BASIC METABOLIC PANEL
Anion gap: 11 (ref 5–15)
BUN: 45 mg/dL — ABNORMAL HIGH (ref 8–23)
CO2: 22 mmol/L (ref 22–32)
Calcium: 9 mg/dL (ref 8.9–10.3)
Chloride: 104 mmol/L (ref 98–111)
Creatinine, Ser: 2.21 mg/dL — ABNORMAL HIGH (ref 0.61–1.24)
GFR, Estimated: 31 mL/min — ABNORMAL LOW (ref >60)
Glucose, Bld: 150 mg/dL — ABNORMAL HIGH (ref 70–99)
Potassium: 4.2 mmol/L (ref 3.5–5.1)
Sodium: 137 mmol/L (ref 135–145)

## 2023-08-05 LAB — CBC WITH DIFFERENTIAL/PLATELET
Abs Immature Granulocytes: 0.03 10*3/uL (ref 0.00–0.07)
Basophils Absolute: 0 10*3/uL (ref 0.0–0.1)
Basophils Relative: 1 %
Eosinophils Absolute: 0.4 10*3/uL (ref 0.0–0.5)
Eosinophils Relative: 7 %
HCT: 27.2 % — ABNORMAL LOW (ref 39.0–52.0)
Hemoglobin: 9 g/dL — ABNORMAL LOW (ref 13.0–17.0)
Immature Granulocytes: 1 %
Lymphocytes Relative: 21 %
Lymphs Abs: 1.1 10*3/uL (ref 0.7–4.0)
MCH: 27.3 pg (ref 26.0–34.0)
MCHC: 33.1 g/dL (ref 30.0–36.0)
MCV: 82.4 fL (ref 80.0–100.0)
Monocytes Absolute: 0.3 10*3/uL (ref 0.1–1.0)
Monocytes Relative: 7 %
Neutro Abs: 3.3 10*3/uL (ref 1.7–7.7)
Neutrophils Relative %: 63 %
Platelets: 177 10*3/uL (ref 150–400)
RBC: 3.3 MIL/uL — ABNORMAL LOW (ref 4.22–5.81)
RDW: 15.6 % — ABNORMAL HIGH (ref 11.5–15.5)
WBC: 5.2 10*3/uL (ref 4.0–10.5)
nRBC: 0 % (ref 0.0–0.2)

## 2023-08-05 LAB — TYPE AND SCREEN
ABO/RH(D): B POS
Antibody Screen: NEGATIVE
Extend sample reason: TRANSFUSED

## 2023-08-05 NOTE — Patient Instructions (Signed)
 Your procedure is scheduled on: Wednesday 08/05/22. You will need to arrive at 12 noon. Report to the Registration Desk on the 1st floor of the [facility]. Valet parking is available.  If your arrival time is 6:00 am, do not arrive before that time as the [facility] entrance doors do not open until 6:00 am.  REMEMBER: Instructions that are not followed completely may result in serious medical risk, up to and including death; or upon the discretion of your surgeon and anesthesiologist your surgery may need to be rescheduled.  Do not eat food or drink any liquids after midnight the night before surgery.  No gum chewing or hard candies.  One week prior to surgery: Stop Anti-inflammatories (NSAIDS) such as Advil, Aleve, Ibuprofen, Motrin, Naproxen, Naprosyn and Aspirin  based products such as Excedrin, Goody's Powder, BC Powder. You may however, continue to take Tylenol  if needed for pain up until the day of surgery.  Stop ANY OVER THE COUNTER supplements until after surgery.  Continue taking all prescribed medications with the exception of the following: NO INSULIN  TOMORROW MORNING 08/06/23  TAKE ONLY THESE MEDICATIONS THE MORNING OF SURGERY WITH A SIP OF WATER:  omeprazole (PRILOSEC) 20 MG capsule Antacid (take one the night before and one on the morning of surgery - helps to prevent nausea after surgery.) fenofibrate  160 MG tablet  May have oxycodone  if needed  No Alcohol for 24 hours before or after surgery.  No Smoking including e-cigarettes for 24 hours before surgery.  No chewable tobacco products for at least 6 hours before surgery.  No nicotine patches on the day of surgery.  Do not use any recreational drugs for at least a week (preferably 2 weeks) before your surgery.  Please be advised that the combination of cocaine and anesthesia may have negative outcomes, up to and including death. If you test positive for cocaine, your surgery will be cancelled.  On the morning of  surgery brush your teeth with toothpaste and water, you may rinse your mouth with mouthwash if you wish. Do not swallow any toothpaste or mouthwash.  Use CHG Soap or wipes as directed on instruction sheet.  Do not wear lotions, powders, or perfumes.   Do not shave body hair from the neck down 48 hours before surgery.  Wear comfortable clothing (specific to your surgery type) to the hospital.  Do not wear jewelry, make-up, hairpins, clips or nail polish.  For welded (permanent) jewelry: bracelets, anklets, waist bands, etc.  Please have this removed prior to surgery.  If it is not removed, there is a chance that hospital personnel will need to cut it off on the day of surgery. Contact lenses, hearing aids and dentures may not be worn into surgery.  Do not bring valuables to the hospital. [facility] is not responsible for any missing/lost belongings or valuables.   Notify your doctor if there is any change in your medical condition (cold, fever, infection).  If you are being discharged the day of surgery, you will not be allowed to drive home. You will need a responsible individual to drive you home and stay with you for 24 hours after surgery.   If you are taking public transportation, you will need to have a responsible individual with you.  If you are being admitted to the hospital overnight, leave your suitcase in the car. After surgery it may be brought to your room.  In case of increased patient census, it may be necessary for you, the patient,  to continue your postoperative care in the Same Day Surgery department.  After surgery, you can help prevent lung complications by doing breathing exercises.  Take deep breaths and cough every 1-2 hours. Your doctor may order a device called an Incentive Spirometer to help you take deep breaths. When coughing or sneezing, hold a pillow firmly against your incision with both hands. This is called "splinting." Doing this helps protect your  incision. It also decreases belly discomfort.  Surgery Visitation Policy:  Patients undergoing a surgery or procedure may have two family members or support persons with them as long as the person is not COVID-19 positive or experiencing its symptoms.   Inpatient Visitation:    Visiting hours are 7 a.m. to 8 p.m. Up to four visitors are allowed at one time in a patient room. The visitors may rotate out with other people during the day. One designated support person (adult) may remain overnight.  Please call the Pre-admissions Testing Dept. at [phone] if you have any questions about these instructions.  Preparing the Skin Before Surgery     To help prevent the risk of infection at your surgical site, we are now providing you with rinse-free Sage 2% Chlorhexidine  Gluconate (CHG) disposable wipes.  Chlorhexidine  Gluconate (CHG) Soap  o An antiseptic cleaner that kills germs and bonds with the skin to continue killing germs even after washing  o Used for showering the night before surgery and morning of surgery  The night before surgery: Shower or bathe with warm water. Do not apply perfume, lotions, powders. Wait one hour after shower. Skin should be dry and cool. Open Sage wipe package - use 6 disposable cloths. Wipe body using one cloth for the right arm, one cloth for the left arm, one cloth for the right leg, one cloth for the left leg, one cloth for the chest/abdomen area, and one cloth for the back. Do not use on open wounds or sores. Do not use on face or genitals (private parts). If you are breast feeding, do not use on breasts. 5. Do not rinse, allow to dry. 6. Skin may feel tacky for several minutes. 7. Dress in clean clothes. 8. Place clean sheets on your bed and do not sleep with pets.  REPEAT ABOVE ON THE MORNING OF SURGERY BEFORE ARRIVING TO THE HOSPITAL.

## 2023-08-06 ENCOUNTER — Ambulatory Visit: Payer: Self-pay | Admitting: Certified Registered"

## 2023-08-06 ENCOUNTER — Ambulatory Visit: Payer: Self-pay | Admitting: Urgent Care

## 2023-08-06 ENCOUNTER — Encounter
Admission: RE | Disposition: A | Discharge status: Home / Self Care | Payer: Self-pay | Source: Home / Self Care | Attending: Vascular Surgery

## 2023-08-06 ENCOUNTER — Other Ambulatory Visit: Payer: Self-pay

## 2023-08-06 ENCOUNTER — Encounter: Payer: Self-pay | Admitting: Vascular Surgery

## 2023-08-06 ENCOUNTER — Ambulatory Visit
Admission: RE | Admit: 2023-08-06 | Discharge: 2023-08-06 | Disposition: A | Discharge status: Home / Self Care | Payer: Medicare PPO | Attending: Vascular Surgery | Admitting: Vascular Surgery

## 2023-08-06 DIAGNOSIS — Z9889 Other specified postprocedural states: Secondary | ICD-10-CM

## 2023-08-06 DIAGNOSIS — F17210 Nicotine dependence, cigarettes, uncomplicated: Secondary | ICD-10-CM | POA: Insufficient documentation

## 2023-08-06 DIAGNOSIS — T8753 Necrosis of amputation stump, right lower extremity: Secondary | ICD-10-CM | POA: Diagnosis not present

## 2023-08-06 DIAGNOSIS — Z89511 Acquired absence of right leg below knee: Secondary | ICD-10-CM | POA: Insufficient documentation

## 2023-08-06 DIAGNOSIS — K219 Gastro-esophageal reflux disease without esophagitis: Secondary | ICD-10-CM | POA: Diagnosis not present

## 2023-08-06 DIAGNOSIS — J449 Chronic obstructive pulmonary disease, unspecified: Secondary | ICD-10-CM | POA: Diagnosis not present

## 2023-08-06 DIAGNOSIS — E1151 Type 2 diabetes mellitus with diabetic peripheral angiopathy without gangrene: Secondary | ICD-10-CM | POA: Insufficient documentation

## 2023-08-06 DIAGNOSIS — T8781 Dehiscence of amputation stump: Secondary | ICD-10-CM | POA: Diagnosis present

## 2023-08-06 DIAGNOSIS — T8789 Other complications of amputation stump: Secondary | ICD-10-CM

## 2023-08-06 DIAGNOSIS — Z794 Long term (current) use of insulin: Secondary | ICD-10-CM | POA: Insufficient documentation

## 2023-08-06 DIAGNOSIS — Y835 Amputation of limb(s) as the cause of abnormal reaction of the patient, or of later complication, without mention of misadventure at the time of the procedure: Secondary | ICD-10-CM | POA: Diagnosis not present

## 2023-08-06 HISTORY — PX: WOUND DEBRIDEMENT: SHX247

## 2023-08-06 LAB — GLUCOSE, CAPILLARY
Glucose-Capillary: 230 mg/dL — ABNORMAL HIGH (ref 70–99)
Glucose-Capillary: 250 mg/dL — ABNORMAL HIGH (ref 70–99)
Glucose-Capillary: 243 mg/dL — ABNORMAL HIGH (ref 70–99)

## 2023-08-06 SURGERY — DEBRIDEMENT, WOUND
Anesthesia: General | Site: Leg Lower | Laterality: Right

## 2023-08-06 MED ORDER — CHLORHEXIDINE GLUCONATE CLOTH 2 % EX PADS
6.0000 | MEDICATED_PAD | Freq: Once | CUTANEOUS | Status: AC
  Administered 2023-08-06: 6 via TOPICAL

## 2023-08-06 MED ORDER — ONDANSETRON HCL 4 MG/2ML IJ SOLN: 4.0000 mg | Freq: Four times a day (QID) | INTRAMUSCULAR | Status: DC | PRN

## 2023-08-06 MED ORDER — OXYCODONE HCL 5 MG PO TABS: 5.0000 mg | ORAL_TABLET | Freq: Once | ORAL | Status: DC | PRN

## 2023-08-06 MED ORDER — ACETAMINOPHEN 10 MG/ML IV SOLN
INTRAVENOUS | Status: DC | PRN
  Administered 2023-08-06: 1000 mg via INTRAVENOUS

## 2023-08-06 MED ORDER — INSULIN ASPART 100 UNIT/ML IJ SOLN
INTRAMUSCULAR | Status: AC
  Filled 2023-08-06: qty 1

## 2023-08-06 MED ORDER — INSULIN ASPART 100 UNIT/ML IJ SOLN
5.0000 [IU] | Freq: Once | INTRAMUSCULAR | Status: AC
  Administered 2023-08-06: 5 [IU] via SUBCUTANEOUS

## 2023-08-06 MED ORDER — LIDOCAINE HCL (PF) 2 % IJ SOLN
INTRAMUSCULAR | Status: AC
  Filled 2023-08-06: qty 5

## 2023-08-06 MED ORDER — FENTANYL CITRATE (PF) 100 MCG/2ML IJ SOLN
INTRAMUSCULAR | Status: AC
  Filled 2023-08-06: qty 2

## 2023-08-06 MED ORDER — CEFAZOLIN SODIUM-DEXTROSE 2-4 GM/100ML-% IV SOLN
INTRAVENOUS | Status: AC
  Filled 2023-08-06: qty 100

## 2023-08-06 MED ORDER — ACETAMINOPHEN 10 MG/ML IV SOLN: 1000.0000 mg | Freq: Once | INTRAVENOUS | Status: DC | PRN

## 2023-08-06 MED ORDER — CHLORHEXIDINE GLUCONATE 0.12 % MT SOLN
15.0000 mL | Freq: Once | OROMUCOSAL | Status: AC
  Administered 2023-08-06: 15 mL via OROMUCOSAL

## 2023-08-06 MED ORDER — LACTATED RINGERS IV SOLN: INTRAVENOUS | Status: AC

## 2023-08-06 MED ORDER — FENTANYL CITRATE (PF) 100 MCG/2ML IJ SOLN: 25.0000 ug | INTRAMUSCULAR | Status: DC | PRN

## 2023-08-06 MED ORDER — MIDAZOLAM HCL 2 MG/2ML IJ SOLN
INTRAMUSCULAR | Status: DC | PRN
  Administered 2023-08-06: 1 mg via INTRAVENOUS

## 2023-08-06 MED ORDER — ORAL CARE MOUTH RINSE: 15.0000 mL | Freq: Once | OROMUCOSAL | Status: AC

## 2023-08-06 MED ORDER — MIDAZOLAM HCL 2 MG/2ML IJ SOLN
INTRAMUSCULAR | Status: AC
Start: 2023-08-06 — End: ?
  Filled 2023-08-06: qty 2

## 2023-08-06 MED ORDER — CHLORHEXIDINE GLUCONATE 0.12 % MT SOLN
OROMUCOSAL | Status: AC
  Filled 2023-08-06: qty 15

## 2023-08-06 MED ORDER — ONDANSETRON HCL 4 MG/2ML IJ SOLN
INTRAMUSCULAR | Status: DC | PRN
  Administered 2023-08-06: 4 mg via INTRAVENOUS

## 2023-08-06 MED ORDER — EPHEDRINE SULFATE-NACL 50-0.9 MG/10ML-% IV SOSY
PREFILLED_SYRINGE | INTRAVENOUS | Status: DC | PRN
  Administered 2023-08-06: 5 mg via INTRAVENOUS
  Administered 2023-08-06: 7.5 mg via INTRAVENOUS

## 2023-08-06 MED ORDER — OXYCODONE HCL 5 MG PO TABS
5.0000 mg | ORAL_TABLET | Freq: Once | ORAL | Status: DC | PRN
Start: 2023-08-06 — End: 2023-08-07

## 2023-08-06 MED ORDER — CEFAZOLIN SODIUM-DEXTROSE 2-4 GM/100ML-% IV SOLN
2.0000 g | INTRAVENOUS | Status: AC
  Administered 2023-08-06: 2 g via INTRAVENOUS

## 2023-08-06 MED ORDER — HYDROMORPHONE HCL 1 MG/ML IJ SOLN: 1.0000 mg | Freq: Once | INTRAMUSCULAR | Status: DC | PRN

## 2023-08-06 MED ORDER — SODIUM CHLORIDE 0.9 % IV SOLN: INTRAVENOUS | Status: DC

## 2023-08-06 MED ORDER — ONDANSETRON HCL 4 MG/2ML IJ SOLN: 4.0000 mg | Freq: Once | INTRAMUSCULAR | Status: DC | PRN

## 2023-08-06 MED ORDER — OXYCODONE HCL 5 MG/5ML PO SOLN: 5.0000 mg | Freq: Once | ORAL | Status: DC | PRN

## 2023-08-06 MED ORDER — PROPOFOL 1000 MG/100ML IV EMUL
INTRAVENOUS | Status: AC
  Filled 2023-08-06: qty 100

## 2023-08-06 MED ORDER — FENTANYL CITRATE (PF) 100 MCG/2ML IJ SOLN
INTRAMUSCULAR | Status: DC | PRN
  Administered 2023-08-06 (×2): 25 ug via INTRAVENOUS
  Administered 2023-08-06: 50 ug via INTRAVENOUS

## 2023-08-06 MED ORDER — ACETAMINOPHEN 10 MG/ML IV SOLN
INTRAVENOUS | Status: AC
  Filled 2023-08-06: qty 100

## 2023-08-06 MED ORDER — OXYCODONE HCL 5 MG PO TABS: 5.0000 mg | ORAL_TABLET | Freq: Three times a day (TID) | ORAL | 0 refills | Status: DC | PRN

## 2023-08-06 MED ORDER — LIDOCAINE HCL (CARDIAC) PF 100 MG/5ML IV SOSY
PREFILLED_SYRINGE | INTRAVENOUS | Status: DC | PRN
  Administered 2023-08-06: 60 mg via INTRAVENOUS

## 2023-08-06 MED ORDER — PROPOFOL 10 MG/ML IV BOLUS
INTRAVENOUS | Status: DC | PRN
  Administered 2023-08-06: 40 mg via INTRAVENOUS
  Administered 2023-08-06: 100 ug/kg/min via INTRAVENOUS

## 2023-08-06 MED ORDER — 0.9 % SODIUM CHLORIDE (POUR BTL) OPTIME
TOPICAL | Status: DC | PRN
  Administered 2023-08-06: 1000 mL

## 2023-08-06 SURGICAL SUPPLY — 33 items
BRUSH SCRUB EZ 4% CHG (MISCELLANEOUS) ×1 IMPLANT
CANISTER WOUND CARE 500ML ATS (WOUND CARE) IMPLANT
CHLORAPREP W/TINT 26 (MISCELLANEOUS) ×1 IMPLANT
DRAPE INCISE IOBAN 66X45 STRL (DRAPES) ×1 IMPLANT
DRSG VAC GRANUFOAM MED (GAUZE/BANDAGES/DRESSINGS) IMPLANT
DRSG XEROFORM 1X8 (GAUZE/BANDAGES/DRESSINGS) IMPLANT
ELECT CAUTERY BLADE 6.4 (BLADE) ×1 IMPLANT
ELECT REM PT RETURN 9FT ADLT (ELECTROSURGICAL) ×1
ELECTRODE REM PT RTRN 9FT ADLT (ELECTROSURGICAL) ×1 IMPLANT
GAUZE PAD ABD 8X10 STRL (GAUZE/BANDAGES/DRESSINGS) IMPLANT
GAUZE SPONGE 4X4 12PLY STRL (GAUZE/BANDAGES/DRESSINGS) IMPLANT
GLOVE BIO SURGEON STRL SZ7 (GLOVE) ×2 IMPLANT
GOWN STRL REUS W/ TWL LRG LVL3 (GOWN DISPOSABLE) ×2 IMPLANT
GOWN STRL REUS W/TWL 2XL LVL3 (GOWN DISPOSABLE) ×1 IMPLANT
IV NS 1000ML BAXH (IV SOLUTION) ×1 IMPLANT
KIT TURNOVER KIT A (KITS) ×1 IMPLANT
LABEL OR SOLS (LABEL) ×1 IMPLANT
MANIFOLD NEPTUNE II (INSTRUMENTS) ×1 IMPLANT
NS IRRIG 500ML POUR BTL (IV SOLUTION) ×1 IMPLANT
PACK EXTREMITY ARMC (MISCELLANEOUS) ×1 IMPLANT
PAD PREP OB/GYN DISP 24X41 (PERSONAL CARE ITEMS) ×1 IMPLANT
PULSAVAC PLUS IRRIG FAN TIP (DISPOSABLE)
SOL PREP PVP 2OZ (MISCELLANEOUS) ×2
SOLUTION PREP PVP 2OZ (MISCELLANEOUS) ×1 IMPLANT
SPONGE T-LAP 18X18 ~~LOC~~+RFID (SPONGE) ×1 IMPLANT
SUT ETHILON 4-0 FS2 18XMFL BLK (SUTURE)
SUT VIC AB 3-0 SH 27X BRD (SUTURE) IMPLANT
SUTURE ETHLN 4-0 FS2 18XMF BLK (SUTURE) IMPLANT
SWAB CULTURE AMIES ANAERIB BLU (MISCELLANEOUS) ×1 IMPLANT
SYR BULB IRRIG 60ML STRL (SYRINGE) ×1 IMPLANT
TIP FAN IRRIG PULSAVAC PLUS (DISPOSABLE) ×1 IMPLANT
TRAP FLUID SMOKE EVACUATOR (MISCELLANEOUS) ×1 IMPLANT
WATER STERILE IRR 500ML POUR (IV SOLUTION) ×1 IMPLANT

## 2023-08-06 NOTE — Anesthesia Postprocedure Evaluation (Signed)
 Anesthesia Post Note  Patient: Toa Mia  Procedure(s) Performed: DEBRIDEMENT WOUND (Right: Leg Lower)  Patient location during evaluation: PACU Anesthesia Type: General Level of consciousness: awake and alert, oriented and patient cooperative Pain management: pain level controlled Vital Signs Assessment: post-procedure vital signs reviewed and stable Respiratory status: spontaneous breathing, nonlabored ventilation and respiratory function stable Cardiovascular status: blood pressure returned to baseline and stable Postop Assessment: adequate PO intake Anesthetic complications: no   No notable events documented.   Last Vitals:  Vitals:   08/06/23 1630 08/06/23 1647  BP: 109/65 113/88  Pulse: 86 86  Resp: 11 12  Temp: (!) 36.2 C (!) 36.4 C  SpO2: 99% 99%    Last Pain:  Vitals:   08/06/23 1647  TempSrc:   PainSc: 0-No pain                 Alfonso Ruths

## 2023-08-06 NOTE — Interval H&P Note (Signed)
 History and Physical Interval Note:  08/06/2023 12:35 PM  Allen Chavez  has presented today for surgery, with the diagnosis of WOUND DEHISCENCE.  The various methods of treatment have been discussed with the patient and family. After consideration of risks, benefits and other options for treatment, the patient has consented to  Procedure(s): DEBRIDEMENT WOUND (Right) as a surgical intervention.  [name] history has been reviewed, patient examined, no change in status, stable for surgery.  I have reviewed the patient's chart and labs.  Questions were answered to the patient's satisfaction.     Chayce Rullo

## 2023-08-06 NOTE — Transfer of Care (Signed)
 Immediate Anesthesia Transfer of Care Note  Patient: Allen Chavez  Procedure(s) Performed: DEBRIDEMENT WOUND (Right: Leg Lower)  Patient Location: PACU  Anesthesia Type:General  Level of Consciousness: awake, alert , and oriented  Airway & Oxygen Therapy: Patient Spontanous Breathing  Post-op Assessment: Report given to RN  Post vital signs: Reviewed and stable  Last Vitals:  Vitals Value Taken Time  BP 93/57 08/06/23 1604  Temp    Pulse 98 08/06/23 1604  Resp 11 08/06/23 1604  SpO2 98 % 08/06/23 1604    Last Pain:  Vitals:   08/06/23 1234  TempSrc: Temporal      Patients Stated Pain Goal: 0 (08/06/23 1234)  Complications: No notable events documented.

## 2023-08-06 NOTE — Anesthesia Procedure Notes (Signed)
 Date/Time: 08/06/2023 3:10 PM  Performed by: Dominica Krabbe, CRNAPre-anesthesia Checklist: Patient identified, Emergency Drugs available, Patient being monitored, Suction available and Timeout performed Patient Re-evaluated:Patient Re-evaluated prior to induction Oxygen Delivery Method: Simple face mask Preoxygenation: Pre-oxygenation with 100% oxygen Induction Type: IV induction

## 2023-08-06 NOTE — Op Note (Signed)
    OPERATIVE NOTE   PROCEDURE: Irrigation and debridement of skin, soft tissue, and muscle fascia for approximately 30 cm with partial delayed primary closure.  PRE-OPERATIVE DIAGNOSIS: Nonviable tissue of the medial and lateral aspects of his previous right below-knee amputation  POST-OPERATIVE DIAGNOSIS: Same as above  SURGEON: Selinda Gu, MD  ASSISTANT(S): None  ANESTHESIA: General  ESTIMATED BLOOD LOSS: 15 cc  FINDING(S): None  SPECIMEN(S): Wound swab sent for culture  INDICATIONS:   Allen Chavez is a 68 y.o. male who presents with some fibrinous exudate and necrotic muscle fascia edge on both the medial and lateral aspects of his right below-knee amputation site.  The wound is intact in the midportion.  There is not significant erythema or obvious infection, but removing some of this nonviable muscle and skin edge would seem to be helpful in promoting his wound healing.  Risks and benefits are discussed and informed consent is obtained.  DESCRIPTION: After obtaining full informed written consent, the patient was brought back to the operating room and placed supine upon the operating table.  The patient received IV antibiotics prior to induction.  After obtaining adequate anesthesia, the patient was prepped and draped in the standard fashion for.  The wound was then opened and excisional debridement was performed to the medial and lateral portion of the wound for some dark eschar on the skin and soft tissue as well as some fibrinous exudate and nonviable muscle.  This was over about 20 to 25 cm medially and about 10 to 15 cm laterally.  This was done back to good healthy tissue to remove all clearly non-viable tissue.  The tissue was taken back to bleeding tissue that appeared viable.  The debridement was performed with scalpel and Metzenbaum scissors and encompassed an area of approximately 35-40 cm2.  The wound was irrigated copiously with sterile saline.  After all clearly  non-viable tissue was removed, the wound seemed very shallow and I felt reapproximating the skin edges would be the best course of action to promote wound healing.  A series of interrupted 3-0 nylon mattress sutures were used on the medial and lateral portion of the wound to help reapproximate the skin edges.  A sterile dressing was placed with a Xeroform, ABD, and a wrapped Kerlix. The patient was then awakened from anesthesia and taken to the recovery room in stable condition having tolerated the procedure well.  COMPLICATIONS: none  CONDITION: stable  Selinda Gu  08/06/2023, 4:10 PM   This note was created with Dragon Medical transcription system. Any errors in dictation are purely unintentional.

## 2023-08-06 NOTE — Progress Notes (Addendum)
 [facility] [phone]) called at 4:41pm to arrange transportation  back to the facility.  Patient dressed and readied for discharge.  Patient wheeled from [facility] post-op area to medical mall area, at [facility] to await arrival of transportation.  Nurse and nurse tech waited with patient.    At 5:20pm, transportation service was called to ask of approximate ETA of [place] service.  Nurse was informed all transportation staff were unavailable/off duty.  Transportation services advised to contact [facility] non-emergent services to arrange ride back to [facility].  At 5:23pm [facility] non-emergent services contacted to arrange ride home.  Patient's demographic info provide and was informed of placement on a waitlist (2 prior needs ahead).  [facility] non-emergent services arrived to pick up at 1920 to transport back to [facility].
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
## 2023-08-07 ENCOUNTER — Encounter: Payer: Self-pay | Admitting: Vascular Surgery

## 2023-08-08 ENCOUNTER — Other Ambulatory Visit (INDEPENDENT_AMBULATORY_CARE_PROVIDER_SITE_OTHER): Payer: Self-pay | Admitting: Nurse Practitioner

## 2023-08-08 NOTE — Progress Notes (Signed)
 Can we reach out to [facility] to see how to send him in some CIPRO?

## 2023-08-11 NOTE — Progress Notes (Signed)
 Verbal order for Ciprofloxacin 750 mg bid for 10 days was received by Allen Chavez with [facility]

## 2023-08-14 LAB — AEROBIC/ANAEROBIC CULTURE W GRAM STAIN (SURGICAL/DEEP WOUND): Gram Stain: NONE SEEN

## 2023-08-18 ENCOUNTER — Inpatient Hospital Stay
Admission: EM | Admit: 2023-08-18 | Discharge: 2023-08-28 | DRG: 683 | Disposition: A | Discharge status: Skilled Nursing Facility | Payer: Medicare PPO | Source: Skilled Nursing Facility | Attending: Obstetrics and Gynecology | Admitting: Obstetrics and Gynecology

## 2023-08-18 ENCOUNTER — Other Ambulatory Visit: Payer: Self-pay

## 2023-08-18 DIAGNOSIS — L03115 Cellulitis of right lower limb: Secondary | ICD-10-CM | POA: Diagnosis present

## 2023-08-18 DIAGNOSIS — Z794 Long term (current) use of insulin: Secondary | ICD-10-CM

## 2023-08-18 DIAGNOSIS — Z89511 Acquired absence of right leg below knee: Secondary | ICD-10-CM | POA: Diagnosis not present

## 2023-08-18 DIAGNOSIS — R1310 Dysphagia, unspecified: Secondary | ICD-10-CM | POA: Diagnosis present

## 2023-08-18 DIAGNOSIS — N1832 Chronic kidney disease, stage 3b: Secondary | ICD-10-CM | POA: Diagnosis present

## 2023-08-18 DIAGNOSIS — B9620 Unspecified Escherichia coli [E. coli] as the cause of diseases classified elsewhere: Secondary | ICD-10-CM | POA: Diagnosis present

## 2023-08-18 DIAGNOSIS — Z89512 Acquired absence of left leg below knee: Secondary | ICD-10-CM | POA: Diagnosis not present

## 2023-08-18 DIAGNOSIS — T8789 Other complications of amputation stump: Secondary | ICD-10-CM | POA: Diagnosis present

## 2023-08-18 DIAGNOSIS — N401 Enlarged prostate with lower urinary tract symptoms: Secondary | ICD-10-CM | POA: Diagnosis present

## 2023-08-18 DIAGNOSIS — K920 Hematemesis: Secondary | ICD-10-CM | POA: Diagnosis present

## 2023-08-18 DIAGNOSIS — E860 Dehydration: Secondary | ICD-10-CM | POA: Diagnosis present

## 2023-08-18 DIAGNOSIS — E11649 Type 2 diabetes mellitus with hypoglycemia without coma: Secondary | ICD-10-CM | POA: Diagnosis present

## 2023-08-18 DIAGNOSIS — F17210 Nicotine dependence, cigarettes, uncomplicated: Secondary | ICD-10-CM | POA: Diagnosis present

## 2023-08-18 DIAGNOSIS — I959 Hypotension, unspecified: Secondary | ICD-10-CM | POA: Diagnosis present

## 2023-08-18 DIAGNOSIS — E875 Hyperkalemia: Secondary | ICD-10-CM

## 2023-08-18 DIAGNOSIS — Z7982 Long term (current) use of aspirin: Secondary | ICD-10-CM | POA: Diagnosis not present

## 2023-08-18 DIAGNOSIS — D631 Anemia in chronic kidney disease: Secondary | ICD-10-CM | POA: Diagnosis present

## 2023-08-18 DIAGNOSIS — Z79890 Hormone replacement therapy (postmenopausal): Secondary | ICD-10-CM

## 2023-08-18 DIAGNOSIS — I129 Hypertensive chronic kidney disease with stage 1 through stage 4 chronic kidney disease, or unspecified chronic kidney disease: Secondary | ICD-10-CM | POA: Diagnosis present

## 2023-08-18 DIAGNOSIS — Y835 Amputation of limb(s) as the cause of abnormal reaction of the patient, or of later complication, without mention of misadventure at the time of the procedure: Secondary | ICD-10-CM | POA: Diagnosis present

## 2023-08-18 DIAGNOSIS — N390 Urinary tract infection, site not specified: Secondary | ICD-10-CM

## 2023-08-18 DIAGNOSIS — E1122 Type 2 diabetes mellitus with diabetic chronic kidney disease: Secondary | ICD-10-CM | POA: Diagnosis present

## 2023-08-18 DIAGNOSIS — E11319 Type 2 diabetes mellitus with unspecified diabetic retinopathy without macular edema: Secondary | ICD-10-CM | POA: Diagnosis present

## 2023-08-18 DIAGNOSIS — Z85828 Personal history of other malignant neoplasm of skin: Secondary | ICD-10-CM

## 2023-08-18 DIAGNOSIS — Z8249 Family history of ischemic heart disease and other diseases of the circulatory system: Secondary | ICD-10-CM

## 2023-08-18 DIAGNOSIS — Z7902 Long term (current) use of antithrombotics/antiplatelets: Secondary | ICD-10-CM | POA: Diagnosis not present

## 2023-08-18 DIAGNOSIS — Z79899 Other long term (current) drug therapy: Secondary | ICD-10-CM

## 2023-08-18 DIAGNOSIS — R32 Unspecified urinary incontinence: Secondary | ICD-10-CM | POA: Diagnosis present

## 2023-08-18 DIAGNOSIS — N179 Acute kidney failure, unspecified: Secondary | ICD-10-CM | POA: Diagnosis not present

## 2023-08-18 DIAGNOSIS — E1151 Type 2 diabetes mellitus with diabetic peripheral angiopathy without gangrene: Secondary | ICD-10-CM | POA: Diagnosis present

## 2023-08-18 DIAGNOSIS — Z833 Family history of diabetes mellitus: Secondary | ICD-10-CM

## 2023-08-18 DIAGNOSIS — Z806 Family history of leukemia: Secondary | ICD-10-CM

## 2023-08-18 DIAGNOSIS — Z8546 Personal history of malignant neoplasm of prostate: Secondary | ICD-10-CM

## 2023-08-18 DIAGNOSIS — K5900 Constipation, unspecified: Secondary | ICD-10-CM | POA: Diagnosis not present

## 2023-08-18 LAB — CBG MONITORING, ED
Glucose-Capillary: 155 mg/dL — ABNORMAL HIGH (ref 70–99)
Glucose-Capillary: 51 mg/dL — ABNORMAL LOW (ref 70–99)

## 2023-08-18 LAB — COMPREHENSIVE METABOLIC PANEL
ALT: 13 U/L (ref 0–44)
AST: 21 U/L (ref 15–41)
Albumin: 2.9 g/dL — ABNORMAL LOW (ref 3.5–5.0)
Alkaline Phosphatase: 96 U/L (ref 38–126)
Anion gap: 15 (ref 5–15)
BUN: 108 mg/dL — ABNORMAL HIGH (ref 8–23)
CO2: 22 mmol/L (ref 22–32)
Calcium: 8.9 mg/dL (ref 8.9–10.3)
Chloride: 103 mmol/L (ref 98–111)
Creatinine, Ser: 6.37 mg/dL — ABNORMAL HIGH (ref 0.61–1.24)
GFR, Estimated: 9 mL/min — ABNORMAL LOW (ref >60)
Glucose, Bld: 123 mg/dL — ABNORMAL HIGH (ref 70–99)
Potassium: 6.6 mmol/L (ref 3.5–5.1)
Sodium: 140 mmol/L (ref 135–145)
Total Bilirubin: 1.3 mg/dL — ABNORMAL HIGH (ref 0.0–1.2)
Total Protein: 7.2 g/dL (ref 6.5–8.1)

## 2023-08-18 LAB — CBC WITH DIFFERENTIAL/PLATELET
Abs Immature Granulocytes: 0.06 10*3/uL (ref 0.00–0.07)
Basophils Absolute: 0.1 10*3/uL (ref 0.0–0.1)
Basophils Relative: 1 %
Eosinophils Absolute: 0 10*3/uL (ref 0.0–0.5)
Eosinophils Relative: 0 %
HCT: 29.9 % — ABNORMAL LOW (ref 39.0–52.0)
Hemoglobin: 9.2 g/dL — ABNORMAL LOW (ref 13.0–17.0)
Immature Granulocytes: 1 %
Lymphocytes Relative: 14 %
Lymphs Abs: 1.4 10*3/uL (ref 0.7–4.0)
MCH: 27.2 pg (ref 26.0–34.0)
MCHC: 30.8 g/dL (ref 30.0–36.0)
MCV: 88.5 fL (ref 80.0–100.0)
Monocytes Absolute: 0.5 10*3/uL (ref 0.1–1.0)
Monocytes Relative: 5 %
Neutro Abs: 7.6 10*3/uL (ref 1.7–7.7)
Neutrophils Relative %: 79 %
Platelets: 335 10*3/uL (ref 150–400)
RBC: 3.38 MIL/uL — ABNORMAL LOW (ref 4.22–5.81)
RDW: 16.6 % — ABNORMAL HIGH (ref 11.5–15.5)
WBC: 9.6 10*3/uL (ref 4.0–10.5)
nRBC: 0 % (ref 0.0–0.2)

## 2023-08-18 LAB — PROTIME-INR
INR: 1.5 — ABNORMAL HIGH (ref 0.8–1.2)
Prothrombin Time: 17.8 s — ABNORMAL HIGH (ref 11.4–15.2)

## 2023-08-18 LAB — TYPE AND SCREEN
ABO/RH(D): B POS
Antibody Screen: NEGATIVE

## 2023-08-18 LAB — TROPONIN I (HIGH SENSITIVITY): Troponin I (High Sensitivity): 12 ng/L (ref <18)

## 2023-08-18 LAB — APTT: aPTT: 32 s (ref 24–36)

## 2023-08-18 LAB — LIPASE, BLOOD: Lipase: 29 U/L (ref 11–51)

## 2023-08-18 LAB — POTASSIUM: Potassium: 6.3 mmol/L (ref 3.5–5.1)

## 2023-08-18 MED ORDER — INSULIN ASPART 100 UNIT/ML IJ SOLN
0.0000 [IU] | Freq: Three times a day (TID) | INTRAMUSCULAR | Status: DC
  Administered 2023-08-18 – 2023-08-19 (×2): 2 [IU] via SUBCUTANEOUS
  Administered 2023-08-20: 5 [IU] via SUBCUTANEOUS
  Administered 2023-08-20: 3 [IU] via SUBCUTANEOUS
  Administered 2023-08-20: 5 [IU] via SUBCUTANEOUS
  Administered 2023-08-21: 3 [IU] via SUBCUTANEOUS
  Administered 2023-08-21: 5 [IU] via SUBCUTANEOUS
  Administered 2023-08-21: 7 [IU] via SUBCUTANEOUS
  Administered 2023-08-22: 1 [IU] via SUBCUTANEOUS
  Administered 2023-08-22: 2 [IU] via SUBCUTANEOUS
  Administered 2023-08-23 (×2): 3 [IU] via SUBCUTANEOUS
  Administered 2023-08-24: 1 [IU] via SUBCUTANEOUS
  Administered 2023-08-24: 2 [IU] via SUBCUTANEOUS
  Administered 2023-08-24: 1 [IU] via SUBCUTANEOUS
  Administered 2023-08-25: 2 [IU] via SUBCUTANEOUS
  Administered 2023-08-25: 1 [IU] via SUBCUTANEOUS
  Administered 2023-08-25: 3 [IU] via SUBCUTANEOUS
  Administered 2023-08-26 (×2): 2 [IU] via SUBCUTANEOUS
  Administered 2023-08-27: 1 [IU] via SUBCUTANEOUS
  Filled 2023-08-18 (×21): qty 1

## 2023-08-18 MED ORDER — SODIUM ZIRCONIUM CYCLOSILICATE 10 G PO PACK
10.0000 g | PACK | Freq: Once | ORAL | Status: AC
  Administered 2023-08-18: 10 g via ORAL
  Filled 2023-08-18: qty 1

## 2023-08-18 MED ORDER — HEPARIN SODIUM (PORCINE) 5000 UNIT/ML IJ SOLN
5000.0000 [IU] | Freq: Three times a day (TID) | INTRAMUSCULAR | Status: DC
  Administered 2023-08-18 – 2023-08-28 (×27): 5000 [IU] via SUBCUTANEOUS
  Filled 2023-08-18 (×29): qty 1

## 2023-08-18 MED ORDER — CALCIUM GLUCONATE 10 % IV SOLN
1.0000 g | Freq: Once | INTRAVENOUS | Status: AC
  Administered 2023-08-18: 1 g via INTRAVENOUS
  Filled 2023-08-18: qty 10

## 2023-08-18 MED ORDER — SODIUM CHLORIDE 0.9 % IV BOLUS
1000.0000 mL | Freq: Once | INTRAVENOUS | Status: AC
  Administered 2023-08-18: 1000 mL via INTRAVENOUS

## 2023-08-18 MED ORDER — INSULIN GLARGINE-YFGN 100 UNIT/ML ~~LOC~~ SOLN
30.0000 [IU] | Freq: Every day | SUBCUTANEOUS | Status: DC
  Filled 2023-08-18: qty 0.3

## 2023-08-18 MED ORDER — SODIUM CHLORIDE 0.9 % IV BOLUS
500.0000 mL | Freq: Once | INTRAVENOUS | Status: AC
  Administered 2023-08-18: 500 mL via INTRAVENOUS

## 2023-08-18 MED ORDER — SODIUM ZIRCONIUM CYCLOSILICATE 10 G PO PACK
10.0000 g | PACK | Freq: Three times a day (TID) | ORAL | Status: DC
  Administered 2023-08-18 – 2023-08-19 (×2): 10 g via ORAL
  Filled 2023-08-18 (×4): qty 1

## 2023-08-18 MED ORDER — PANTOPRAZOLE SODIUM 40 MG IV SOLR
40.0000 mg | Freq: Three times a day (TID) | INTRAVENOUS | Status: DC
  Administered 2023-08-18: 40 mg via INTRAVENOUS

## 2023-08-18 MED ORDER — DEXTROSE 50 % IV SOLN
1.0000 | Freq: Once | INTRAVENOUS | Status: AC
Start: 2023-08-18 — End: 2023-08-18
  Administered 2023-08-18: 50 mL via INTRAVENOUS
  Filled 2023-08-18: qty 50

## 2023-08-18 MED ORDER — INSULIN ASPART 100 UNIT/ML IJ SOLN
10.0000 [IU] | Freq: Once | INTRAMUSCULAR | Status: AC
  Administered 2023-08-18: 10 [IU] via INTRAVENOUS
  Filled 2023-08-18: qty 1

## 2023-08-18 MED ORDER — LEVOTHYROXINE SODIUM 25 MCG PO TABS
25.0000 ug | ORAL_TABLET | Freq: Every day | ORAL | Status: DC
  Administered 2023-08-19 – 2023-08-28 (×9): 25 ug via ORAL
  Filled 2023-08-18 (×10): qty 1

## 2023-08-18 MED ORDER — ONDANSETRON HCL 4 MG/2ML IJ SOLN
4.0000 mg | Freq: Once | INTRAMUSCULAR | Status: AC
Start: 2023-08-18 — End: 2023-08-18
  Administered 2023-08-18: 4 mg via INTRAVENOUS
  Filled 2023-08-18: qty 2

## 2023-08-18 MED ORDER — SODIUM CHLORIDE 0.9 % IV BOLUS
1000.0000 mL | Freq: Once | INTRAVENOUS | Status: AC
Start: 2023-08-18 — End: 2023-08-18
  Administered 2023-08-18: 1000 mL via INTRAVENOUS

## 2023-08-18 MED ORDER — PANTOPRAZOLE SODIUM 40 MG IV SOLR
40.0000 mg | INTRAVENOUS | Status: AC
  Filled 2023-08-18: qty 10

## 2023-08-18 MED ORDER — ASPIRIN 81 MG PO TBEC
81.0000 mg | DELAYED_RELEASE_TABLET | Freq: Every day | ORAL | Status: DC
Start: 2023-08-19 — End: 2023-08-28
  Administered 2023-08-19 – 2023-08-28 (×10): 81 mg via ORAL
  Filled 2023-08-18 (×10): qty 1

## 2023-08-18 MED ORDER — PANTOPRAZOLE SODIUM 40 MG IV SOLR
40.0000 mg | Freq: Two times a day (BID) | INTRAVENOUS | Status: DC
  Administered 2023-08-18 – 2023-08-22 (×8): 40 mg via INTRAVENOUS
  Filled 2023-08-18 (×8): qty 10

## 2023-08-18 MED ORDER — SODIUM CHLORIDE 0.9 % IV SOLN: INTRAVENOUS | Status: DC

## 2023-08-18 MED ORDER — PANTOPRAZOLE SODIUM 40 MG IV SOLR: 40.0000 mg | Freq: Two times a day (BID) | INTRAVENOUS | Status: DC

## 2023-08-18 NOTE — ED Notes (Addendum)
Attempted to get pt's potassium lab. Pt started yelling "get the fuck out" and told me to get out and not come back. This RN asked another RN to attempt to get blood from pt for labs.

## 2023-08-18 NOTE — ED Notes (Signed)
Rainbow and type and screen sent to the lab with save tube labels. Pt EKG done by NT and sent to EDP.

## 2023-08-18 NOTE — ED Notes (Signed)
EDP notified of Critical lab result.
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
## 2023-08-18 NOTE — ED Triage Notes (Signed)
Arrives via [facility] from [facility] C/o chest pain, paleness Fully A/ox4 Vomited 1000 mL of coffee ground substance, been vomiting that way for 3 days denies eating for 3 days, has not started dialysis, was supposed to talk about today Medical Hx: Renal Dz, Stage II CKD, double below the knee amputee 1 L of vluid Interventions: 88/60 came up to 120/56 Has 500 mL fluids HR: 100% RA Cap 23, HR 90 CBG 161 #18g RAC by [facility]

## 2023-08-18 NOTE — ED Notes (Signed)
This RN took amputee bandage off per Dr. Derrill Kay EDP. He wanted to assess.
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
## 2023-08-18 NOTE — ED Notes (Addendum)
Pt given a pack of graham crackers, a cup of ice cream, and a cup of orange juice

## 2023-08-18 NOTE — ED Provider Notes (Signed)
[facility] Provider Note    Event Date/Time   First MD Initiated Contact with Patient 08/18/23 1318     (approximate)   History   Hematemesis   HPI {Remember to add pertinent medical, surgical, social, and/or OB history to HPI:1} Solomone Tyrie is a 68 y.o. male  ***       Physical Exam   Triage Vital Signs: ED Triage Vitals  Encounter Vitals Group     BP 08/18/23 1323 113/60     Systolic BP Percentile --      Diastolic BP Percentile --      Pulse Rate 08/18/23 1323 95     Resp --      Temp 08/18/23 1323 (!) 97.4 F (36.3 C)     Temp Source 08/18/23 1323 Oral     SpO2 08/18/23 1321 100 %     Weight 08/18/23 1323 146 lb 3.2 oz (66.3 kg)     Height 08/18/23 1323 6\' 2"  (1.88 m)     Head Circumference --      Peak Flow --      Pain Score 08/18/23 1323 0     Pain Loc --      Pain Education --      Exclude from Growth Chart --     Most recent vital signs: Vitals:   08/18/23 1321 08/18/23 1323  BP:  113/60  Pulse:  95  Temp:  (!) 97.4 F (36.3 C)  SpO2: 100% 95%    {Only need to document appropriate and relevant physical exam:1} General: Awake, no distress. *** CV:  Good peripheral perfusion. *** Resp:  Normal effort. *** Abd:  No distention. *** Other:  ***   ED Results / Procedures / Treatments   Labs (all labs ordered are listed, but only abnormal results are displayed) Labs Reviewed - No data to display   EKG  ***   RADIOLOGY *** {USE THE WORD "INTERPRETED"!! You MUST document your own interpretation of imaging, as well as the fact that you reviewed the radiologist's report!:1}   PROCEDURES:  Critical Care performed: Yes  CRITICAL CARE Performed by: Phineas Semen   Total critical care time: *** minutes  Critical care time was exclusive of separately billable procedures and treating other patients.  Critical care was necessary to treat or prevent imminent or life-threatening deterioration.  Critical  care was time spent personally by me on the following activities: development of treatment plan with patient and/or surrogate as well as nursing, discussions with consultants, evaluation of patient's response to treatment, examination of patient, obtaining history from patient or surrogate, ordering and performing treatments and interventions, ordering and review of laboratory studies, ordering and review of radiographic studies, pulse oximetry and re-evaluation of patient's condition.   Procedures    MEDICATIONS ORDERED IN ED: Medications - No data to display   IMPRESSION / MDM / ASSESSMENT AND PLAN / ED COURSE  I reviewed the triage vital signs and the nursing notes.                              Differential diagnosis includes, but is not limited to, ***  Patient's presentation is most consistent with {EM COPA:27473}   ***The patient is on the cardiac monitor to evaluate for evidence of arrhythmia and/or significant heart rate changes.  ***      FINAL CLINICAL IMPRESSION(S) / ED DIAGNOSES   Final diagnoses:  None  Rx / DC Orders   ED Discharge Orders     None        Note:  This document was prepared using Dragon voice recognition software and may include unintentional dictation errors.

## 2023-08-19 ENCOUNTER — Inpatient Hospital Stay: Payer: Medicare PPO

## 2023-08-19 DIAGNOSIS — N179 Acute kidney failure, unspecified: Secondary | ICD-10-CM | POA: Diagnosis not present

## 2023-08-19 LAB — BASIC METABOLIC PANEL
Anion gap: 12 (ref 5–15)
BUN: 96 mg/dL — ABNORMAL HIGH (ref 8–23)
CO2: 20 mmol/L — ABNORMAL LOW (ref 22–32)
Calcium: 8.5 mg/dL — ABNORMAL LOW (ref 8.9–10.3)
Chloride: 109 mmol/L (ref 98–111)
Creatinine, Ser: 5.87 mg/dL — ABNORMAL HIGH (ref 0.61–1.24)
GFR, Estimated: 10 mL/min — ABNORMAL LOW (ref >60)
Glucose, Bld: 96 mg/dL (ref 70–99)
Potassium: 5.6 mmol/L — ABNORMAL HIGH (ref 3.5–5.1)
Sodium: 141 mmol/L (ref 135–145)

## 2023-08-19 LAB — CBC
HCT: 24.8 % — ABNORMAL LOW (ref 39.0–52.0)
Hemoglobin: 8 g/dL — ABNORMAL LOW (ref 13.0–17.0)
MCH: 28.3 pg (ref 26.0–34.0)
MCHC: 32.3 g/dL (ref 30.0–36.0)
MCV: 87.6 fL (ref 80.0–100.0)
Platelets: 298 10*3/uL (ref 150–400)
RBC: 2.83 MIL/uL — ABNORMAL LOW (ref 4.22–5.81)
RDW: 16.5 % — ABNORMAL HIGH (ref 11.5–15.5)
WBC: 7.7 10*3/uL (ref 4.0–10.5)
nRBC: 0 % (ref 0.0–0.2)

## 2023-08-19 LAB — MAGNESIUM: Magnesium: 1.8 mg/dL (ref 1.7–2.4)

## 2023-08-19 LAB — CBG MONITORING, ED
Glucose-Capillary: 73 mg/dL (ref 70–99)
Glucose-Capillary: 91 mg/dL (ref 70–99)

## 2023-08-19 LAB — GLUCOSE, CAPILLARY
Glucose-Capillary: 192 mg/dL — ABNORMAL HIGH (ref 70–99)
Glucose-Capillary: 207 mg/dL — ABNORMAL HIGH (ref 70–99)

## 2023-08-19 LAB — POTASSIUM: Potassium: 5.6 mmol/L — ABNORMAL HIGH (ref 3.5–5.1)

## 2023-08-19 MED ORDER — LINEZOLID 600 MG/300ML IV SOLN
600.0000 mg | Freq: Two times a day (BID) | INTRAVENOUS | Status: DC
  Administered 2023-08-19 – 2023-08-23 (×9): 600 mg via INTRAVENOUS
  Filled 2023-08-19 (×10): qty 300

## 2023-08-19 MED ORDER — NEPRO/CARBSTEADY PO LIQD
237.0000 mL | Freq: Three times a day (TID) | ORAL | Status: DC
  Administered 2023-08-19 – 2023-08-27 (×16): 237 mL via ORAL

## 2023-08-19 MED ORDER — CLOPIDOGREL BISULFATE 75 MG PO TABS
75.0000 mg | ORAL_TABLET | Freq: Every day | ORAL | Status: DC
  Administered 2023-08-20 – 2023-08-28 (×9): 75 mg via ORAL
  Filled 2023-08-19 (×9): qty 1

## 2023-08-19 MED ORDER — SODIUM CHLORIDE 0.9 % IV SOLN
2.0000 g | Freq: Once | INTRAVENOUS | Status: AC
  Administered 2023-08-19: 2 g via INTRAVENOUS
  Filled 2023-08-19 (×2): qty 2

## 2023-08-19 MED ORDER — SODIUM BICARBONATE 8.4 % IV SOLN
INTRAVENOUS | Status: DC
  Filled 2023-08-19 (×3): qty 1000
  Filled 2023-08-19 (×2): qty 150
  Filled 2023-08-19: qty 1000

## 2023-08-19 MED ORDER — OXYCODONE HCL 5 MG PO TABS
5.0000 mg | ORAL_TABLET | Freq: Three times a day (TID) | ORAL | Status: DC | PRN
  Administered 2023-08-20 – 2023-08-26 (×10): 5 mg via ORAL
  Filled 2023-08-19 (×11): qty 1

## 2023-08-19 MED ORDER — SODIUM ZIRCONIUM CYCLOSILICATE 10 G PO PACK
10.0000 g | PACK | Freq: Two times a day (BID) | ORAL | Status: DC
Start: 2023-08-19 — End: 2023-08-21
  Administered 2023-08-19 – 2023-08-21 (×4): 10 g via ORAL
  Filled 2023-08-19 (×4): qty 1

## 2023-08-19 MED ORDER — SODIUM CHLORIDE 0.9 % IV SOLN
1.0000 g | INTRAVENOUS | Status: DC
Start: 2023-08-20 — End: 2023-08-22
  Administered 2023-08-20 – 2023-08-22 (×3): 1 g via INTRAVENOUS
  Filled 2023-08-19 (×3): qty 1

## 2023-08-19 NOTE — Progress Notes (Signed)
Upon initial assessment pt found completely undressed in his bed rolling onto his side, restless and anxious. Alert and oriented to self, month and year, confused to location and situation. Noted a moderate amount of blood on his bed and saw that his IV had come out. He was wet with urine and had pulled off his condom cath. Urine emptied into the commode was foul smelling and cloudy and mucousy. He was given a bed bath and full linnen change, new condom cath applied, #22 gauge IV restarted in Rt forearm; wrapped with kerlix for protection. Sodium Bicarb restarted per physician order and HS medications given. He continued to say that he doesn't "want to play this game any more" and that he does want to be this character anymore and to stop playing. Rt lateral AC noted to have an abscess, no drainage. 0.5 x 0.5 x 0.5; covered with mepilex. Mepilex foam applied to bilateral BKA stumps which were covered with eschar. Moaning and complaining of pain but then refused his pain medication. Currently resting in bed without complaints and IV infusing.

## 2023-08-19 NOTE — ED Notes (Signed)
Pt meal tray delivered. Pt wishing to sleep a little longer and then I will assist with setting him up to eat. Pt has no complaints at this time. CBG checked and wnl.

## 2023-08-19 NOTE — ED Notes (Signed)
MD made aware of pt requesting PT/OT. SW made aware of patient's desire to change rehab facilities. Per SW, pt needs to coordinate with SW at current rehab facility to facilitate a transfer.

## 2023-08-19 NOTE — Progress Notes (Signed)
  PROGRESS NOTE    Allen Chavez  VHQ:[id number] DOB: 11-02-1955 DOA: 08/18/2023 PCP: [name], MD  127A/127A-AA  LOS: 1 day   Brief hospital course:   Assessment & Plan: Allen Chavez is a 68 y.o. male with medical history significant of DM2, PAD s/p bilateral BKA, CKD 3a, HTN who presented from SNF with report of vomiting of coffee-ground material.      AKI CKD 3a --Cr 6.37 on presentation, was 2.21 on 08/04/22, with baseline around 1.3.  AKI presumed due to dehydration. --s/p 1.5L bolus in the ED f/b MIVF --nephrology consulted --Cr improved the next day with IVF Plan: --cont MIVF@75  as Na bicarb gtt   Hyperkalemia --potassium 6.6 on presentation, 2/2 AKI.  Received temporizing agents in the ED. --started MIVF@75  and scheduled Lokelma, per nephro Plan: --cont MIVF --cont Lokelma 10 mg BID   Ulcerations over right stump site with cellulitis Hx of right foot gangrene s/p right BKA on 06/04/23 --Vascular surgery requested to eval wounds --start abx today per vascular surgery.  ID pharm rec ceftazidime and Linezolid based on prior I/D culture results --wound RN consult   PAD Remote hx of left BKA  Recent hx of right BKA --cont ASA --resume home plavix --resume home statin after discharge   Coffee-ground emesis  --reportedly had coffee-ground emesis at SNF.  Hgb 9.2, which is similar to recent baseline. --IV PPI BID --monitor for now.  Consider GI consult if Hgb drops.   DM2 --hold home long-acting insulin for now due to hypoglycemia --ACHS and SSI for now   Hypotension --BP dropped more after having received 1.5L bolus in the ED.  Another 1L bolus ordered --cont MIVF   HTN, not currently active --hold home Lisinopril  Dysphagia --pt reported more difficulty with swallowing solids since Oct 2024 when his medical problems began.  --supplement with Nepro TID   DVT prophylaxis: Heparin SQ Code Status: Full code  Family Communication: friend updated at  bedside today Level of care: Med-Surg Dispo:   The patient is from: SNF  Anticipated d/c is to: SNF Anticipated d/c date is: 2-3 days Patient currently is not medically ready to d/c due to: AKI on IVF   Subjective and Interval History:  Pt complained of urinary incontinence and difficulty swallowing solid food.   Objective: Vitals:   08/19/23 0800 08/19/23 1030 08/19/23 1155 08/19/23 1731  BP: 118/60 95/82 121/63 (!) 107/55  Pulse: 74  79 95  Resp: 12 (!) 21 20 20   Temp:    97.6 F (36.4 C)  TempSrc:      SpO2: 100%  99% 100%  Weight:      Height:       No intake or output data in the 24 hours ending 08/19/23 1848 Filed Weights   08/18/23 1323  Weight: 66.3 kg    Examination:   Constitutional: NAD, AAOx3 HEENT: conjunctivae and lids normal, EOMI CV: No cyanosis.   RESP: normal respiratory effort, on RA Extremities: bilateral BKA, ulcerations and erythema over right stump. Neuro: II - XII grossly intact.         Data Reviewed: I have personally reviewed labs and imaging studies  Time spent: 50 minutes  Darlin Priestly, MD [facility] If 7PM-7AM, please contact night-coverage 08/19/2023, 6:48 PM
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
## 2023-08-19 NOTE — Progress Notes (Signed)
Pharmacy Antibiotic Note  Allen Chavez is a 68 y.o. male admitted on 08/18/2023 with wound infection at R BKA site.  Pharmacy has been consulted for ceftazidime dosing. Patient had I&D of BKA site on 1/8 and cultures grew E coli, pseudomonas, and MRSA.  Vascular NP has note of starting ciprofloxacin 1/10 (contacting SNF to start).  Patient presents with AKA  Today, 08/19/2023 D1 linezolid and ceftazidime Renal: AKI on CKD - current CrCl 11 ml/min WBC WNL Afebrile 1/8 wound culture: P aeruginosa (Susc ceftaz, cipro, pip/tazo, imi, gent), E coli (resistant to ampicillin only), MRSA (susc to tetracycline and linezolid)  No drainage at this time to send for culture  Plan: Ceftazidime 2gm IV x1 then 1gm q24h per current CrCl Monitor renal function closely Monitor for thrombocytopenia with linezolid especially in light of AKI.    Height: 6\' 2"  (188 cm) Weight: 66.3 kg (146 lb 3.2 oz) IBW/kg (Calculated) : 82.2  Temp (24hrs), Avg:97.7 F (36.5 C), Min:97.4 F (36.3 C), Max:98 F (36.7 C)  Recent Labs  Lab 08/18/23 1324 08/19/23 0401  WBC 9.6 7.7  CREATININE 6.37* 5.87*    Estimated Creatinine Clearance: 11.1 mL/min (A) (by C-G formula based on SCr of 5.87 mg/dL (H)).    No Known Allergies   Thank you for allowing pharmacy to be a part of this patient's care.  Juliette Alcide, PharmD, BCPS, BCIDP Work Cell: [phone] 08/19/2023 11:16 AM
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
## 2023-08-19 NOTE — Inpatient Diabetes Management (Signed)
Inpatient Diabetes Program Recommendations  AACE/ADA: New Consensus Statement on Inpatient Glycemic Control   Target Ranges:  Prepandial:   less than 140 mg/dL      Peak postprandial:   less than 180 mg/dL (1-2 hours)      Critically ill patients:  140 - 180 mg/dL    Latest Reference Range & Units 08/18/23 15:07 08/18/23 17:10 08/18/23 23:21 08/19/23 00:40  Glucose-Capillary 70 - 99 mg/dL   Novolog 10 units  W09 50 ml 155 (H)  Novolog 2 units @17 :17 51 (L) 91    Review of Glycemic Control  Diabetes history: DM2 Outpatient Diabetes medications: Lantus 30 units daily Current orders for Inpatient glycemic control: Semglee 30 units daily, Novolog 0-9 units TID with meals  Inpatient Diabetes Program Recommendations:    Insulin:  CBG down to 51 mg/dl at 81:19 on 1/47/82. Please consider decreasing Semglee to 15 units daily.  Thanks, Orlando Penner, RN, MSN, CDCES Diabetes Coordinator Inpatient Diabetes Program [phone] (Team Pager from 8am to 5pm)

## 2023-08-19 NOTE — ED Notes (Signed)
Pt asking about his glasses. This RN and Abran Richard, RN looked everywhere in the room and could not find them.

## 2023-08-19 NOTE — ED Notes (Addendum)
PT gave this RN permission to give daughter, Elease Hashimoto, back with  an update. This RN called daughter and gave her an update.

## 2023-08-19 NOTE — ED Notes (Signed)
Pt pulled the IV out of their arm. Pt was confused and thought they were back at the facility that they came from.

## 2023-08-20 DIAGNOSIS — N179 Acute kidney failure, unspecified: Secondary | ICD-10-CM | POA: Diagnosis not present

## 2023-08-20 LAB — BASIC METABOLIC PANEL
Anion gap: 9 (ref 5–15)
BUN: 83 mg/dL — ABNORMAL HIGH (ref 8–23)
CO2: 25 mmol/L (ref 22–32)
Calcium: 8.4 mg/dL — ABNORMAL LOW (ref 8.9–10.3)
Chloride: 105 mmol/L (ref 98–111)
Creatinine, Ser: 4.56 mg/dL — ABNORMAL HIGH (ref 0.61–1.24)
GFR, Estimated: 13 mL/min — ABNORMAL LOW (ref >60)
Glucose, Bld: 277 mg/dL — ABNORMAL HIGH (ref 70–99)
Potassium: 5.1 mmol/L (ref 3.5–5.1)
Sodium: 139 mmol/L (ref 135–145)

## 2023-08-20 LAB — CBC
HCT: 22.3 % — ABNORMAL LOW (ref 39.0–52.0)
Hemoglobin: 7.2 g/dL — ABNORMAL LOW (ref 13.0–17.0)
MCH: 27.6 pg (ref 26.0–34.0)
MCHC: 32.3 g/dL (ref 30.0–36.0)
MCV: 85.4 fL (ref 80.0–100.0)
Platelets: 236 10*3/uL (ref 150–400)
RBC: 2.61 MIL/uL — ABNORMAL LOW (ref 4.22–5.81)
RDW: 16.2 % — ABNORMAL HIGH (ref 11.5–15.5)
WBC: 4.9 10*3/uL (ref 4.0–10.5)
nRBC: 0 % (ref 0.0–0.2)

## 2023-08-20 LAB — GLUCOSE, CAPILLARY
Glucose-Capillary: 256 mg/dL — ABNORMAL HIGH (ref 70–99)
Glucose-Capillary: 286 mg/dL — ABNORMAL HIGH (ref 70–99)
Glucose-Capillary: 248 mg/dL — ABNORMAL HIGH (ref 70–99)
Glucose-Capillary: 234 mg/dL — ABNORMAL HIGH (ref 70–99)

## 2023-08-20 LAB — MAGNESIUM: Magnesium: 2 mg/dL (ref 1.7–2.4)

## 2023-08-20 MED ORDER — ZINC OXIDE 40 % EX OINT
TOPICAL_OINTMENT | Freq: Two times a day (BID) | CUTANEOUS | Status: DC
  Administered 2023-08-21 – 2023-08-22 (×2): 1 via TOPICAL
  Filled 2023-08-20: qty 113

## 2023-08-20 MED ORDER — INSULIN GLARGINE-YFGN 100 UNIT/ML ~~LOC~~ SOLN
10.0000 [IU] | Freq: Every day | SUBCUTANEOUS | Status: DC
  Administered 2023-08-20: 10 [IU] via SUBCUTANEOUS
  Filled 2023-08-20 (×2): qty 0.1

## 2023-08-20 MED ORDER — MEDIHONEY WOUND/BURN DRESSING EX PSTE
1.0000 | PASTE | Freq: Every day | CUTANEOUS | Status: DC
  Administered 2023-08-21 – 2023-08-27 (×8): 1 via TOPICAL
  Filled 2023-08-20 (×3): qty 44

## 2023-08-20 NOTE — Progress Notes (Signed)
Contacted on call provider via secure chat as follows:       I just emptied this patient's urine which he voided in the urinal. In 20 years I have never seen urine look so thick and milky and it is foul smelling. He yells and moans in pain the entire time he is voiding and then holds himself for minutes afterward due to pain. He says he dreads having to go. Next time he goes can I get a UA on him please?  43 mins MJ Modou Charlotte Sanes, NP Order in, please get a clean catch

## 2023-08-20 NOTE — Consult Note (Addendum)
WOC Nurse Consult Note: patient underwent R BKA 05/2023, had surgical debridement of the wound dehiscence by vascular 08/06/2023 Reason for Consult: R stump wounds  Wound type:1.  Full thickness R posterior leg (popliteal area) 2.  Full thickness R medial and lateral stump  3. Moisture Associated Skin Damage to buttocks/coccyx  Pressure Injury POA: NA  Measurement: 1.  Full thickness R posterior leg 4 cm x 3 cm (unable to determine true depth due to large amount of necrotic tissue) 50% pink dry 50% tan fibrinous material  2.  R medial stump 9 cm x 2 cm 50% yellow 50% black eschar  3.  R lateral stump 2 cm x 4 cm x 0.8 cm 50% red 50% yellow/black  Wound bed: as above  Drainage (amount, consistency, odor) tan exudate  Periwound: mild erythema to stump wound, intact to R posterior leg wound  Dressing procedure/placement/frequency: Cleanse wounds R posterior leg and medial and lateral stump with Vashe wound cleanser Hart [place] [phone]), apply Medihoney to wound beds daily, cover with dry gauze. Cover with ABD pad and secure with Kerlix roll gauze.    Will order a thin layer of Desitin to buttocks/coccyx/sacrum 2 times daily for erythema and partial thickness skin loss coccyx.   Per H&P consult being made to vascular.  Any wound care orders placed by vascular supercede WOC wound care orders.   POC discussed with bedside nurse. WOC team will not follow. Re-consult if further needs arise.   Thank you,    Priscella Mann MSN, RN-BC, [employer] [phone]

## 2023-08-20 NOTE — Evaluation (Signed)
Physical Therapy Evaluation Patient Details Name: Allen Chavez MRN: [id number] DOB: 06/01/1956 Today's Date: 08/20/2023  History of Present Illness  Pt is a 68 y.o. male with medical history significant of DM2, PAD s/p bilateral BKA, CKD 3a, HTN who presented from SNF with report of vomiting of coffee-ground material.   Clinical Impression  The patient was alert, CNA at bedside. Pt easily irritated with questions and multitasking but did eventually state he was at rehab most recently and prior to that was living at home alone, independent. He was able to roll R and L with CGA, use of bed rails. Supine <> sit minA, and noted for posterior lean throughout sitting despite BUE support. Pt declined further mobility due to feeling overwhelmed, pt oriented and comforted as able.  Overall the patient demonstrated deficits (see "PT Problem List") that impede the patient's functional abilities, safety, and mobility and would benefit from skilled PT intervention.          If plan is discharge home, recommend the following: A lot of help with walking and/or transfers;A lot of help with bathing/dressing/bathroom;Assist for transportation;Direct supervision/assist for financial management;Assistance with cooking/housework;Help with stairs or ramp for entrance   Can travel by private vehicle   No    Equipment Recommendations Other (comment) (TBD)  Recommendations for Other Services       Functional Status Assessment Patient has had a recent decline in their functional status and demonstrates the ability to make significant improvements in function in a reasonable and predictable amount of time.     Precautions / Restrictions Precautions Precautions: Fall Precaution Comments: bilat BKA Restrictions Weight Bearing Restrictions Per Provider Order: No      Mobility  Bed Mobility Overal bed mobility: Needs Assistance Bed Mobility: Supine to Sit, Rolling, Sit to Supine Rolling: Contact guard assist    Supine to sit: Min assist, HOB elevated, Used rails Sit to supine: Min assist, Used rails        Transfers                   General transfer comment: pt deferred due to pain, incontinence, irritated by questions    Ambulation/Gait                  Stairs            Wheelchair Mobility     Tilt Bed    Modified Rankin (Stroke Patients Only)       Balance Overall balance assessment: Needs assistance Sitting-balance support: Feet unsupported, Bilateral upper extremity supported Sitting balance-Leahy Scale: Poor Sitting balance - Comments: reliant on BUE support and CGA-minA due to posterior lean                                     Pertinent Vitals/Pain Pain Assessment Pain Assessment: Faces Faces Pain Scale: Hurts a little bit    Home Living Family/patient expects to be discharged to:: Private residence Living Arrangements: Alone Available Help at Discharge: Family;Available PRN/intermittently Type of Home: Mobile home Home Access: Stairs to enter       Home Layout: One level   Additional Comments: at SNF since BKA, reported transferring to W/C with assistance, slideboard    Prior Function               Mobility Comments: Independent ambulator.       Extremity/Trunk Assessment   Upper Extremity Assessment Upper Extremity  Assessment: Generalized weakness    Lower Extremity Assessment Lower Extremity Assessment: Generalized weakness       Communication      Cognition Arousal: Alert Behavior During Therapy: Anxious Overall Cognitive Status: No family/caregiver present to determine baseline cognitive functioning                                 General Comments: pt oriented x3 but thought he was having surgery today, did seem to display some situational confusion        General Comments      Exercises     Assessment/Plan    PT Assessment Patient needs continued PT services  PT  Problem List Decreased strength;Decreased range of motion;Decreased knowledge of use of DME;Decreased balance;Decreased activity tolerance;Pain;Decreased mobility;Decreased knowledge of precautions       PT Treatment Interventions DME instruction;Balance training;Gait training;Neuromuscular re-education;Stair training;Functional mobility training;Patient/family education;Therapeutic activities;Therapeutic exercise    PT Goals (Current goals can be found in the Care Plan section)  Acute Rehab PT Goals Patient Stated Goal: to be independent PT Goal Formulation: With patient Time For Goal Achievement: 09/03/23 Potential to Achieve Goals: Fair    Frequency Min 1X/week     Co-evaluation               AM-PAC PT "6 Clicks" Mobility  Outcome Measure Help needed turning from your back to your side while in a flat bed without using bedrails?: A Little Help needed moving from lying on your back to sitting on the side of a flat bed without using bedrails?: A Little Help needed moving to and from a bed to a chair (including a wheelchair)?: A Little Help needed standing up from a chair using your arms (e.g., wheelchair or bedside chair)?: A Little Help needed to walk in hospital room?: Total Help needed climbing 3-5 steps with a railing? : Total 6 Click Score: 14    End of Session   Activity Tolerance: Patient limited by fatigue Patient left: in bed;with call bell/phone within reach;with nursing/sitter in room Nurse Communication: Mobility status PT Visit Diagnosis: Difficulty in walking, not elsewhere classified (R26.2);Muscle weakness (generalized) (M62.81)    Time: 1610-9604 PT Time Calculation (min) (ACUTE ONLY): 20 min   Charges:   PT Evaluation $PT Eval Low Complexity: 1 Low PT Treatments $Therapeutic Activity: 8-22 mins PT General Charges $$ ACUTE PT VISIT: 1 Visit         Olga Coaster PT, DPT 12:45 PM,08/20/23

## 2023-08-20 NOTE — Plan of Care (Signed)

## 2023-08-20 NOTE — Inpatient Diabetes Management (Signed)
Inpatient Diabetes Program Recommendations  AACE/ADA: New Consensus Statement on Inpatient Glycemic Control  Target Ranges:  Prepandial:   less than 140 mg/dL      Peak postprandial:   less than 180 mg/dL (1-2 hours)      Critically ill patients:  140 - 180 mg/dL     Latest Reference Range & Units 08/18/23 17:10 08/18/23 23:21 08/19/23 00:40 08/19/23 07:43 08/19/23 17:39 08/19/23 20:21 08/20/23 07:36  Glucose-Capillary 70 - 99 mg/dL 010 (H) 51 (L) 91 73 272 (H) 207 (H) 256 (H)   Review of Glycemic Control  Diabetes history: DM2 Outpatient Diabetes medications: Lantus 30 units daily Current orders for Inpatient glycemic control: Novolog 0-9 units TID with meals   Inpatient Diabetes Program Recommendations:     Insulin:  CBG 256 mg/dl this morning. Please consider ordering Semglee 10 units daily.  Thanks, Orlando Penner, RN, MSN, CDCES Diabetes Coordinator Inpatient Diabetes Program [phone] (Team Pager from 8am to 5pm)

## 2023-08-20 NOTE — Progress Notes (Signed)
[facility]  ROUNDING NOTE   Subjective:   Patient sitting up in bed Ill appearing  Creatinine 4.56 from 5.87 Urine output 1.7L   Objective:  Vital signs in last 24 hours:  Temp:  [97.5 F (36.4 C)-97.8 F (36.6 C)] 97.8 F (36.6 C) (01/22 0747) Pulse Rate:  [74-95] 74 (01/22 0747) Resp:  [17-20] 18 (01/22 0747) BP: (107-126)/(51-65) 121/65 (01/22 0747) SpO2:  [99 %-100 %] 100 % (01/22 0747)  Weight change:  Filed Weights   08/18/23 1323  Weight: 66.3 kg    Intake/Output: I/O last 3 completed shifts: In: 1347.9 [P.O.:360; I.V.:585; IV Piggyback:402.9] Out: 1700 [Urine:1700]   Intake/Output this shift:  Total I/O In: 1592.8 [P.O.:120; I.V.:753; IV Piggyback:719.8] Out: 650 [Urine:650]  Physical Exam: General: Ill appearing  Head: Normocephalic, atraumatic. Moist oral mucosal membranes  Eyes: Anicteric  Lungs:  Clear to auscultation, normal effort  Heart: Regular rate and rhythm  Abdomen:  Soft, nontender  Extremities:  No peripheral edema.  Neurologic: Nonfocal, moving all four extremities  Skin: No lesions       Basic Metabolic Panel: Recent Labs  Lab 08/18/23 1324 08/18/23 1931 08/19/23 0041 08/19/23 0401 08/20/23 0648  NA 140  --   --  141 139  K 6.6* 6.3* 5.6* 5.6* 5.1  CL 103  --   --  109 105  CO2 22  --   --  20* 25  GLUCOSE 123*  --   --  96 277*  BUN 108*  --   --  96* 83*  CREATININE 6.37*  --   --  5.87* 4.56*  CALCIUM 8.9  --   --  8.5* 8.4*  MG  --   --   --  1.8 2.0    Liver Function Tests: Recent Labs  Lab 08/18/23 1324  AST 21  ALT 13  ALKPHOS 96  BILITOT 1.3*  PROT 7.2  ALBUMIN 2.9*   Recent Labs  Lab 08/18/23 1324  LIPASE 29   No results for input(s): "AMMONIA" in the last 168 hours.  CBC: Recent Labs  Lab 08/18/23 1324 08/19/23 0401 08/20/23 0648  WBC 9.6 7.7 4.9  NEUTROABS 7.6  --   --   HGB 9.2* 8.0* 7.2*  HCT 29.9* 24.8* 22.3*  MCV 88.5 87.6 85.4  PLT 335 298 236    Cardiac  Enzymes: No results for input(s): "CKTOTAL", "CKMB", "CKMBINDEX", "TROPONINI" in the last 168 hours.  BNP: Invalid input(s): "POCBNP"  CBG: Recent Labs  Lab 08/19/23 0743 08/19/23 1739 08/19/23 2021 08/20/23 0736 08/20/23 1243  GLUCAP 73 192* 207* 256* 286*    Microbiology: Results for orders placed or performed during the hospital encounter of 08/06/23  Aerobic/Anaerobic Culture w Gram Stain (surgical/deep wound)     Status: None   Collection Time: 08/06/23  3:38 PM   Specimen: Wound  Result Value Ref Range Status   Specimen Description   Final    WOUND Performed at [facility], [address] [place], [place] [address]    Special Requests   Final    WD Performed at [facility], [address] [place], [place] [address]    Gram Stain NO WBC SEEN RARE GRAM NEGATIVE RODS   Final   Culture   Final    ABUNDANT ESCHERICHIA COLI MODERATE PSEUDOMONAS AERUGINOSA FEW METHICILLIN RESISTANT STAPHYLOCOCCUS AUREUS NO ANAEROBES ISOLATED Performed at [facility], [address], [place], [place] [address]    Report Status 08/14/2023 FINAL  Final   Organism ID, Bacteria ESCHERICHIA COLI  Final   Organism ID, Bacteria PSEUDOMONAS AERUGINOSA  Final   Organism ID, Bacteria METHICILLIN RESISTANT STAPHYLOCOCCUS AUREUS  Final      Susceptibility   Escherichia coli - MIC*    AMPICILLIN >=32 RESISTANT Resistant     CEFEPIME <=0.12 SENSITIVE Sensitive     CEFTAZIDIME <=1 SENSITIVE Sensitive     CEFTRIAXONE <=0.25 SENSITIVE Sensitive     CIPROFLOXACIN <=0.25 SENSITIVE Sensitive     GENTAMICIN <=1 SENSITIVE Sensitive     IMIPENEM <=0.25 SENSITIVE Sensitive     TRIMETH/SULFA <=20 SENSITIVE Sensitive     AMPICILLIN/SULBACTAM 4 SENSITIVE Sensitive     PIP/TAZO <=4 SENSITIVE Sensitive ug/mL    * ABUNDANT ESCHERICHIA COLI   Methicillin resistant staphylococcus aureus - MIC*    CIPROFLOXACIN >=8 RESISTANT Resistant     ERYTHROMYCIN >=8 RESISTANT Resistant      GENTAMICIN <=0.5 SENSITIVE Sensitive     OXACILLIN >=4 RESISTANT Resistant     TETRACYCLINE <=1 SENSITIVE Sensitive     VANCOMYCIN 1 SENSITIVE Sensitive     TRIMETH/SULFA >=320 RESISTANT Resistant     CLINDAMYCIN <=0.25 SENSITIVE Sensitive     RIFAMPIN <=0.5 SENSITIVE Sensitive     Inducible Clindamycin NEGATIVE Sensitive     LINEZOLID 2 SENSITIVE Sensitive     * FEW METHICILLIN RESISTANT STAPHYLOCOCCUS AUREUS   Pseudomonas aeruginosa - MIC*    CEFTAZIDIME 4 SENSITIVE Sensitive     CIPROFLOXACIN <=0.25 SENSITIVE Sensitive     GENTAMICIN 4 SENSITIVE Sensitive     IMIPENEM 2 SENSITIVE Sensitive     PIP/TAZO 8 SENSITIVE Sensitive ug/mL    * MODERATE PSEUDOMONAS AERUGINOSA    Coagulation Studies: Recent Labs    08/18/23 1324  LABPROT 17.8*  INR 1.5*    Urinalysis: No results for input(s): "COLORURINE", "LABSPEC", "PHURINE", "GLUCOSEU", "HGBUR", "BILIRUBINUR", "KETONESUR", "PROTEINUR", "UROBILINOGEN", "NITRITE", "LEUKOCYTESUR" in the last 72 hours.  Invalid input(s): "APPERANCEUR"    Imaging: US RENAL Result Date: 08/19/2023 CLINICAL DATA:  Acute kidney injury. EXAM: RENAL / URINARY TRACT ULTRASOUND COMPLETE COMPARISON:  05/29/2023 FINDINGS: Right Kidney: Renal measurements: 10.2 x 3.7 x 5.4 cm = volume: 165 mL. Cortical atrophy and mildly increased cortical echogenicity. No hydronephrosis, focal mass or shadowing calculi. Left Kidney: Renal measurements: 11.2 x 4.9 x 4.9 cm = volume: 139 mL. Cortical atrophy and mildly increased cortical echogenicity. No hydronephrosis, focal mass or shadowing calculi. Bladder: Sonographer states that the patient refused further imaging after imaging of the kidneys and therefore the bladder was not imaged. Other: None. IMPRESSION: Bilateral renal cortical atrophy and mildly increased cortical echogenicity consistent with chronic kidney disease. No hydronephrosis. Electronically Signed   By: Irish Lack M.D.   On: 08/19/2023 16:55      Medications:    cefTAZidime (FORTAZ)  IV 1 g (08/20/23 1058)   linezolid (ZYVOX) IV 600 mg (08/20/23 0943)   sodium bicarbonate 150 mEq in dextrose 5 % 1,150 mL infusion 75 mL/hr at 08/20/23 1016    aspirin EC  81 mg Oral Daily   clopidogrel  75 mg Oral Daily   feeding supplement (NEPRO CARB STEADY)  237 mL Oral TID BM   heparin  5,000 Units Subcutaneous Q8H   insulin aspart  0-9 Units Subcutaneous TID WC   insulin glargine-yfgn  10 Units Subcutaneous QHS   leptospermum manuka honey  1 Application Topical Daily   levothyroxine  25 mcg Oral Q0600   liver oil-zinc oxide   Topical BID  pantoprazole (PROTONIX) IV  40 mg Intravenous Q12H   sodium zirconium cyclosilicate  10 g Oral BID   oxyCODONE  Assessment/ Plan:  Allen Chavez is a 68 y.o.  male with past medical concerns including diabetes, PAD with bilateral BKA, CKD 3a, and hypertension, who was admitted to [facility] on 08/18/2023 for Hyperkalemia [E87.5] AKI (acute kidney injury) (HCC) [N17.9]   Acute kidney injury with hyperkalemia on chronic kidney disease stage IIIa. Baseline creatinine appears to be 1.5 with GFR 50 on 04/17/2023. Acute kidney injury appears secondary to prolonged GI losses from vomiting. Will order renal ultrasound to evaluate for obstruction. Creatinine 6.37 with potassium 6.6 on presentation. - Creatinine has improved with IVF. Potassium 5.1. Continue Lokelma as ordered. No acute need for dialysis. Will continue to monitor.   Lab Results  Component Value Date   CREATININE 4.56 (H) 08/20/2023   CREATININE 5.87 (H) 08/19/2023   CREATININE 6.37 (H) 08/18/2023    Intake/Output Summary (Last 24 hours) at 08/20/2023 1354 Last data filed at 08/20/2023 1200 Gross per 24 hour  Intake 2940.75 ml  Output 2350 ml  Net 590.75 ml   2. Anemia of chronic kidney disease Lab Results  Component Value Date   HGB 7.2 (L) 08/20/2023    Hgb decreased. Will defer need of transfusion to primary team. Blood pressure  stable  3. Hypertension with chronic kidney disease.  Home regimen includes lisinopril.  Currently held. Blood pressure stable   4. Diabetes mellitus type II with chronic kidney disease/renal manifestations: insulin dependent. Home regimen includes Lantus. Most recent hemoglobin A1c is 7.1 on 05/28/2023.      LOS: 2 Amyrie Illingworth 1/22/20251:54 PM

## 2023-08-20 NOTE — Progress Notes (Addendum)
Upon entering the room to administer AM levothyroxine and SQ Heparin, pt was asleep. Tried to arouse him to administer medications and he refused. He wanted to keep sleeping and didn't "want another shot." On call provider notified.

## 2023-08-20 NOTE — Progress Notes (Signed)
[facility]  - [place] [facility]   PATIENT NAME: Allen Chavez    MR#:  [id number]  DATE OF BIRTH:  1955/10/24  SUBJECTIVE:  no family at bedside. Patient eating lunch. Does not have his dentures. Managing to choose some corn. Denies any new complaints. Tolerating PO diet. Getting IV fluids. Creatinine improving. Updated on his condition    VITALS:  Blood pressure 121/65, pulse 74, temperature 97.8 F (36.6 C), resp. rate 18, height 6\' 2"  (1.88 m), weight 66.3 kg, SpO2 100%.  PHYSICAL EXAMINATION:   GENERAL:  68 y.o.-year-old patient with no acute distress.  LUNGS: Normal breath sounds bilaterally, no wheezing CARDIOVASCULAR: S1, S2 normal. No murmur   ABDOMEN: Soft, nontender, nondistended. Bowel sounds present.  EXTREMITIES: bilateral BKA with right BKA dressing present NEUROLOGIC: nonfocal  patient is alert and awake SKIN:  per RN  LABORATORY PANEL:  CBC Recent Labs  Lab 08/20/23 0648  WBC 4.9  HGB 7.2*  HCT 22.3*  PLT 236    Chemistries  Recent Labs  Lab 08/18/23 1324 08/18/23 1931 08/20/23 0648  NA 140   < > 139  K 6.6*   < > 5.1  CL 103   < > 105  CO2 22   < > 25  GLUCOSE 123*   < > 277*  BUN 108*   < > 83*  CREATININE 6.37*   < > 4.56*  CALCIUM 8.9   < > 8.4*  MG  --    < > 2.0  AST 21  --   --   ALT 13  --   --   ALKPHOS 96  --   --   BILITOT 1.3*  --   --    < > = values in this interval not displayed.   Cardiac Enzymes No results for input(s): "TROPONINI" in the last 168 hours. RADIOLOGY:  US RENAL Result Date: 08/19/2023 CLINICAL DATA:  Acute kidney injury. EXAM: RENAL / URINARY TRACT ULTRASOUND COMPLETE COMPARISON:  05/29/2023 FINDINGS: Right Kidney: Renal measurements: 10.2 x 3.7 x 5.4 cm = volume: 165 mL. Cortical atrophy and mildly increased cortical echogenicity. No hydronephrosis, focal mass or shadowing calculi. Left Kidney: Renal measurements: 11.2 x 4.9 x 4.9 cm = volume: 139 mL. Cortical atrophy and mildly increased  cortical echogenicity. No hydronephrosis, focal mass or shadowing calculi. Bladder: Sonographer states that the patient refused further imaging after imaging of the kidneys and therefore the bladder was not imaged. Other: None. IMPRESSION: Bilateral renal cortical atrophy and mildly increased cortical echogenicity consistent with chronic kidney disease. No hydronephrosis. Electronically Signed   By: Irish Lack M.D.   On: 08/19/2023 16:55    Assessment and Plan  Allen Chavez is a 68 y.o. male with medical history significant of DM2, PAD s/p bilateral BKA, CKD 3a, HTN who presented from SNF with report of vomiting of coffee-ground material.      AKI CKD 3a --Cr 6.37 on presentation, was 2.21 on 08/04/22, with baseline around 1.3.  AKI presumed due to dehydration. --s/p 1.5L bolus in the ED f/b MIVF --nephrology consulted --Cr improving down to 4.5 --cont MIVF@75  as Na bicarb gtt   Hyperkalemia --potassium 6.6 on presentation, 2/2 AKI.  Received temporizing agents in the ED. 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  ID pharm rec ceftazidime and Linezolid based on prior I/D culture results --wound RN consult   PAD Remote hx of left BKA  Recent hx of right BKA --cont ASA --resume home plavix, statin (once done with linezolid)   Coffee-ground emesis  --reportedly had coffee-ground emesis at SNF.  Hgb 9.2, which is similar to recent baseline. --IV PPI BID --monitor for now.  Consider GI consult if Hgb drops.   DM2 --pt now on Semglee. W/f hypoglycemia --ACHS and SSI for now   Hypotension --stable now   HTN, not currently active --hold home Lisinopril   Dysphagia --pt reported more difficulty with swallowing solids since Oct 2024 when his  medical problems began.    Procedures: Family communication :none today Consults : nephrology CODE STATUS: full DVT Prophylaxis : heparin Level of care: Med-Surg Status is: Inpatient Remains inpatient appropriate because: AKI on CKD and amputation stomach infection  TOC fro d/c planning--per pt he does not want to return back to his previous facility    TOTAL TIME TAKING CARE OF THIS PATIENT: 35 minutes.  >50% time spent on counselling and coordination of care  Note: This dictation was prepared with Dragon dictation along with smaller phrase technology. Any transcriptional errors that result from this process are unintentional.  Enedina Finner M.D    [facility]   CC: Primary care physician; [name], MD

## 2023-08-21 DIAGNOSIS — N179 Acute kidney failure, unspecified: Secondary | ICD-10-CM | POA: Diagnosis not present

## 2023-08-21 LAB — GLUCOSE, CAPILLARY
Glucose-Capillary: 239 mg/dL — ABNORMAL HIGH (ref 70–99)
Glucose-Capillary: 318 mg/dL — ABNORMAL HIGH (ref 70–99)
Glucose-Capillary: 254 mg/dL — ABNORMAL HIGH (ref 70–99)
Glucose-Capillary: 213 mg/dL — ABNORMAL HIGH (ref 70–99)

## 2023-08-21 LAB — URINALYSIS, COMPLETE (UACMP) WITH MICROSCOPIC
Bilirubin Urine: NEGATIVE
Glucose, UA: >=500 mg/dL — AB
Ketones, ur: NEGATIVE mg/dL
Nitrite: NEGATIVE
Protein, ur: 100 mg/dL — AB
Specific Gravity, Urine: 1.01 (ref 1.005–1.030)
Squamous Epithelial / HPF: 0 /[HPF] (ref 0–5)
WBC, UA: >50 WBC/hpf (ref 0–5)
pH: 7 (ref 5.0–8.0)

## 2023-08-21 LAB — BASIC METABOLIC PANEL
Anion gap: 9 (ref 5–15)
BUN: 72 mg/dL — ABNORMAL HIGH (ref 8–23)
CO2: 26 mmol/L (ref 22–32)
Calcium: 8.1 mg/dL — ABNORMAL LOW (ref 8.9–10.3)
Chloride: 98 mmol/L (ref 98–111)
Creatinine, Ser: 3.83 mg/dL — ABNORMAL HIGH (ref 0.61–1.24)
GFR, Estimated: 16 mL/min — ABNORMAL LOW (ref >60)
Glucose, Bld: 290 mg/dL — ABNORMAL HIGH (ref 70–99)
Potassium: 4.4 mmol/L (ref 3.5–5.1)
Sodium: 133 mmol/L — ABNORMAL LOW (ref 135–145)

## 2023-08-21 LAB — MAGNESIUM: Magnesium: 1.8 mg/dL (ref 1.7–2.4)

## 2023-08-21 MED ORDER — INSULIN GLARGINE-YFGN 100 UNIT/ML ~~LOC~~ SOLN
15.0000 [IU] | Freq: Every day | SUBCUTANEOUS | Status: DC
Start: 2023-08-21 — End: 2023-08-24
  Administered 2023-08-21 – 2023-08-23 (×3): 15 [IU] via SUBCUTANEOUS
  Filled 2023-08-21 (×3): qty 0.15

## 2023-08-21 MED ORDER — SODIUM CHLORIDE 0.9 % IV SOLN: INTRAVENOUS | Status: DC

## 2023-08-21 NOTE — Care Management Important Message (Signed)
Important Message  Patient Details  Name: Allen Chavez MRN: [id number] Date of Birth: 06-17-1956   Important Message Given:  Yes - Medicare IM     Cristela Blue, CMA 08/21/2023, 9:51 AM

## 2023-08-21 NOTE — Inpatient Diabetes Management (Signed)
Inpatient Diabetes Program Recommendations  AACE/ADA: New Consensus Statement on Inpatient Glycemic Control  Target Ranges:  Prepandial:   less than 140 mg/dL      Peak postprandial:   less than 180 mg/dL (1-2 hours)      Critically ill patients:  140 - 180 mg/dL    Latest Reference Range & Units 08/20/23 07:36 08/20/23 12:43 08/20/23 16:03 08/20/23 20:48 08/21/23 08:48  Glucose-Capillary 70 - 99 mg/dL 160 (H) 109 (H) 323 (H) 234 (H) 239 (H)   Review of Glycemic Control  Diabetes history: DM2 Outpatient Diabetes medications: Lantus 30 units daily Current orders for Inpatient glycemic control: Semglee 10 units at bedtime, Novolog 0-9 units TID with meals   Inpatient Diabetes Program Recommendations:     Insulin:  Please consider increasing Semglee to 15 units at bedtime and adding Novolog 3 units TID with meals for meal coverage if patient eats at least 50% of meals.   Thanks, Orlando Penner, RN, MSN, CDCES Diabetes Coordinator Inpatient Diabetes Program [phone] (Team Pager from 8am to 5pm)

## 2023-08-21 NOTE — Progress Notes (Signed)
Urine was sent for analysis. Results show many bacteria and large amount of leukocytes. On call provider notified.

## 2023-08-21 NOTE — Evaluation (Signed)
Occupational Therapy Evaluation Patient Details Name: Allen Chavez MRN: [id number] DOB: 20-Aug-1955 Today's Date: 08/21/2023   History of Present Illness Pt is a 68 y.o. male with medical history significant of DM2, PAD s/p bilateral BKA, CKD 3a, HTN who presented from SNF with report of vomiting of coffee-ground material.   Clinical Impression   Pt was seen for OT evaluation this date. Prior to hospital admission, pt was residing at [facility] receiving therapy services following his R BKA in Nov 2024. Prior to that, pt lived alone and was IND.    Pt presents to acute OT demonstrating impaired ADL performance and functional mobility 2/2 weakness, pain, balance deficits and low activity tolerance  (See OT problem list for additional functional deficits). Pt currently requires CGA for bed mobility. He was able to long sit in bed to feed himself breakfast with set up assist and verb cues due to low vision. Pt demo lateral scoot transfer from bed to recliner with Min A, verb cues. He was able to reposition himself in the chair using chair arms.  Pt would benefit from skilled OT services to address noted impairments and functional limitations (see below for any additional details) in order to maximize safety and independence while minimizing falls risk and caregiver burden. Do anticipate the need for follow up OT services upon acute hospital DC.        If plan is discharge home, recommend the following: A little help with walking and/or transfers;A little help with bathing/dressing/bathroom;Assistance with cooking/housework;Assist for transportation;Help with stairs or ramp for entrance;Direct supervision/assist for financial management;Supervision due to cognitive status;Direct supervision/assist for medications management    Functional Status Assessment  Patient has had a recent decline in their functional status and demonstrates the ability to make significant improvements in function in a reasonable and  predictable amount of time.  Equipment Recommendations  Other (comment) (defer to next venue)    Recommendations for Other Services       Precautions / Restrictions Precautions Precautions: Fall Precaution Comments: bilat BKA Restrictions Weight Bearing Restrictions Per Provider Order: No      Mobility Bed Mobility Overal bed mobility: Needs Assistance Bed Mobility: Supine to Sit, Rolling, Sit to Supine Rolling: Contact guard assist   Supine to sit: HOB elevated, Used rails, Contact guard Sit to supine: Used rails, Contact guard assist        Transfers Overall transfer level: Needs assistance   Transfers: Bed to chair/wheelchair/BSC            Lateral/Scoot Transfers: Min assist General transfer comment: Min A to recliner from EOB with Min A and increased time      Balance Overall balance assessment: Needs assistance Sitting-balance support: Feet unsupported, Bilateral upper extremity supported Sitting balance-Leahy Scale: Fair Sitting balance - Comments: BUE support needed, but able to perform with SBA today                                   ADL either performed or assessed with clinical judgement   ADL Overall ADL's : Needs assistance/impaired Eating/Feeding: Set up;Bed level;Sitting Eating/Feeding Details (indicate cue type and reason): long sitting in bed able to feed self breakfast with increased time, set up and verb cues d/t low vision                     Toilet Transfer: Minimal assistance Toilet Transfer Details (indicate cue type and reason): simulated  to recliner via lateral scoot with Min A; declined use of sliding board                 Vision         Perception         Praxis         Pertinent Vitals/Pain Pain Assessment Pain Assessment: 0-10 Pain Score: 4  Pain Location: R BKA site Pain Descriptors / Indicators: Aching Pain Intervention(s): Monitored during session, Repositioned      Extremity/Trunk Assessment Upper Extremity Assessment Upper Extremity Assessment: Generalized weakness   Lower Extremity Assessment Lower Extremity Assessment: Generalized weakness       Communication Communication Communication: No apparent difficulties   Cognition Arousal: Alert Behavior During Therapy: Anxious Overall Cognitive Status: No family/caregiver present to determine baseline cognitive functioning                                 General Comments: pt oriented x3; pt is sarcastic/jokes a lot so hard to assess cognition     General Comments       Exercises Other Exercises Other Exercises: Edu on role of OT in acute setting and importance of therapy to maximize safety/strength/IND.   Shoulder Instructions      Home Living Family/patient expects to be discharged to:: Skilled nursing facility Living Arrangements: Alone Available Help at Discharge: Family;Available PRN/intermittently Type of Home: Mobile home Home Access: Stairs to enter     Home Layout: One level     Bathroom Shower/Tub: Chief Strategy Officer: Standard         Additional Comments: at SNF since BKA, reported transferring to W/C with assistance, slideboard      Prior Functioning/Environment               Mobility Comments: Independent ambulator prior to R BKA ADLs Comments: IND, including driving and IADLs prior to R bka        OT Problem List: Decreased strength;Decreased activity tolerance;Impaired balance (sitting and/or standing);Pain      OT Treatment/Interventions: Self-care/ADL training;Therapeutic exercise;Balance training;DME and/or AE instruction;Therapeutic activities;Patient/family education    OT Goals(Current goals can be found in the care plan section) Acute Rehab OT Goals Patient Stated Goal: improve strength OT Goal Formulation: With patient Time For Goal Achievement: 09/04/23 Potential to Achieve Goals: Good ADL Goals Pt  Will Perform Grooming: with set-up;sitting Pt Will Perform Upper Body Bathing: with set-up;sitting Pt Will Perform Lower Body Bathing: with contact guard assist;sitting/lateral leans Pt Will Perform Lower Body Dressing: with contact guard assist;sitting/lateral leans Pt Will Transfer to Toilet: with supervision;with transfer board;with contact guard assist Pt Will Perform Toileting - Clothing Manipulation and hygiene: with contact guard assist;sitting/lateral leans  OT Frequency: Min 1X/week    Co-evaluation              AM-PAC OT "6 Clicks" Daily Activity     Outcome Measure Help from another person eating meals?: A Little Help from another person taking care of personal grooming?: A Little Help from another person toileting, which includes using toliet, bedpan, or urinal?: A Lot Help from another person bathing (including washing, rinsing, drying)?: A Little Help from another person to put on and taking off regular upper body clothing?: A Little Help from another person to put on and taking off regular lower body clothing?: A Lot 6 Click Score: 16   End of Session Nurse Communication: Mobility status  Activity Tolerance: Patient tolerated treatment well Patient left: in chair;with call bell/phone within reach;with chair alarm set;with nursing/sitter in room  OT Visit Diagnosis: Other abnormalities of gait and mobility (R26.89);Muscle weakness (generalized) (M62.81)                Time: 3329-5188 OT Time Calculation (min): 38 min Charges:  OT General Charges $OT Visit: 1 Visit OT Evaluation $OT Eval Moderate Complexity: 1 Mod OT Treatments $Self Care/Home Management : 8-22 mins $Therapeutic Activity: 8-22 mins Citlally Captain, OTR/L 08/21/23, 9:49 AM  Constance Goltz 08/21/2023, 9:45 AM

## 2023-08-21 NOTE — Progress Notes (Addendum)
[facility]  ROUNDING NOTE   Subjective:   Patient seen sitting up in chair Alert and oriented Tolerating meals without nausea or vomiting Decent urine output noted  IV fluids to 75 mL/h  Creatinine 3.83 from 4.56 Urine output 1.35L   Objective:  Vital signs in last 24 hours:  Temp:  [97.8 F (36.6 C)-98.1 F (36.7 C)] 98.1 F (36.7 C) (01/23 0852) Pulse Rate:  [59-74] 59 (01/23 0852) Resp:  [18] 18 (01/23 0852) BP: (105-151)/(59-135) 151/135 (01/23 0852) SpO2:  [97 %-100 %] 97 % (01/23 0852)  Weight change:  Filed Weights   08/18/23 1323  Weight: 66.3 kg    Intake/Output: I/O last 3 completed shifts: In: 3008.8 [P.O.:1200; I.V.:1089; IV Piggyback:719.8] Out: 3050 [Urine:3050]   Intake/Output this shift:  Total I/O In: 1728.3 [P.O.:240; I.V.:792.1; IV Piggyback:696.2] Out: 150 [Urine:150]  Physical Exam: General: NAD  Head: Normocephalic, atraumatic. Moist oral mucosal membranes  Eyes: Anicteric  Lungs:  Clear to auscultation, normal effort  Heart: Regular rate and rhythm  Abdomen:  Soft, nontender  Extremities:  No peripheral edema.  Neurologic: Awake and alert, moving all four extremities  Skin: No lesions       Basic Metabolic Panel: Recent Labs  Lab 08/18/23 1324 08/18/23 1931 08/19/23 0041 08/19/23 0401 08/20/23 0648 08/21/23 0429  NA 140  --   --  141 139 133*  K 6.6* 6.3* 5.6* 5.6* 5.1 4.4  CL 103  --   --  109 105 98  CO2 22  --   --  20* 25 26  GLUCOSE 123*  --   --  96 277* 290*  BUN 108*  --   --  96* 83* 72*  CREATININE 6.37*  --   --  5.87* 4.56* 3.83*  CALCIUM 8.9  --   --  8.5* 8.4* 8.1*  MG  --   --   --  1.8 2.0 1.8    Liver Function Tests: Recent Labs  Lab 08/18/23 1324  AST 21  ALT 13  ALKPHOS 96  BILITOT 1.3*  PROT 7.2  ALBUMIN 2.9*   Recent Labs  Lab 08/18/23 1324  LIPASE 29   No results for input(s): "AMMONIA" in the last 168 hours.  CBC: Recent Labs  Lab 08/18/23 1324 08/19/23 0401  08/20/23 0648  WBC 9.6 7.7 4.9  NEUTROABS 7.6  --   --   HGB 9.2* 8.0* 7.2*  HCT 29.9* 24.8* 22.3*  MCV 88.5 87.6 85.4  PLT 335 298 236    Cardiac Enzymes: No results for input(s): "CKTOTAL", "CKMB", "CKMBINDEX", "TROPONINI" in the last 168 hours.  BNP: Invalid input(s): "POCBNP"  CBG: Recent Labs  Lab 08/20/23 1243 08/20/23 1603 08/20/23 2048 08/21/23 0848 08/21/23 1118  GLUCAP 286* 248* 234* 239* 318*    Microbiology: Results for orders placed or performed during the hospital encounter of 08/06/23  Aerobic/Anaerobic Culture w Gram Stain (surgical/deep wound)     Status: None   Collection Time: 08/06/23  3:38 PM   Specimen: Wound  Result Value Ref Range Status   Specimen Description   Final    WOUND Performed at [facility], [address], [place], [place] [address]    Special Requests   Final    WD Performed at [facility], [address] [place], [place] [address]    Gram Stain NO WBC SEEN RARE GRAM NEGATIVE RODS   Final   Culture   Final    ABUNDANT ESCHERICHIA COLI MODERATE  PSEUDOMONAS AERUGINOSA FEW METHICILLIN RESISTANT STAPHYLOCOCCUS AUREUS NO ANAEROBES ISOLATED Performed at [facility], [address] [place], [place] [address]    Report Status 08/14/2023 FINAL  Final   Organism ID, Bacteria ESCHERICHIA COLI  Final   Organism ID, Bacteria PSEUDOMONAS AERUGINOSA  Final   Organism ID, Bacteria METHICILLIN RESISTANT STAPHYLOCOCCUS AUREUS  Final      Susceptibility   Escherichia coli - MIC*    AMPICILLIN >=32 RESISTANT Resistant     CEFEPIME <=0.12 SENSITIVE Sensitive     CEFTAZIDIME <=1 SENSITIVE Sensitive     CEFTRIAXONE <=0.25 SENSITIVE Sensitive     CIPROFLOXACIN <=0.25 SENSITIVE Sensitive     GENTAMICIN <=1 SENSITIVE Sensitive     IMIPENEM <=0.25 SENSITIVE Sensitive     TRIMETH/SULFA <=20 SENSITIVE Sensitive     AMPICILLIN/SULBACTAM 4 SENSITIVE Sensitive     PIP/TAZO <=4 SENSITIVE Sensitive ug/mL    *  ABUNDANT ESCHERICHIA COLI   Methicillin resistant staphylococcus aureus - MIC*    CIPROFLOXACIN >=8 RESISTANT Resistant     ERYTHROMYCIN >=8 RESISTANT Resistant     GENTAMICIN <=0.5 SENSITIVE Sensitive     OXACILLIN >=4 RESISTANT Resistant     TETRACYCLINE <=1 SENSITIVE Sensitive     VANCOMYCIN 1 SENSITIVE Sensitive     TRIMETH/SULFA >=320 RESISTANT Resistant     CLINDAMYCIN <=0.25 SENSITIVE Sensitive     RIFAMPIN <=0.5 SENSITIVE Sensitive     Inducible Clindamycin NEGATIVE Sensitive     LINEZOLID 2 SENSITIVE Sensitive     * FEW METHICILLIN RESISTANT STAPHYLOCOCCUS AUREUS   Pseudomonas aeruginosa - MIC*    CEFTAZIDIME 4 SENSITIVE Sensitive     CIPROFLOXACIN <=0.25 SENSITIVE Sensitive     GENTAMICIN 4 SENSITIVE Sensitive     IMIPENEM 2 SENSITIVE Sensitive     PIP/TAZO 8 SENSITIVE Sensitive ug/mL    * MODERATE PSEUDOMONAS AERUGINOSA    Coagulation Studies: Recent Labs    08/18/23 1324  LABPROT 17.8*  INR 1.5*    Urinalysis: Recent Labs    08/21/23 0510  COLORURINE YELLOW*  LABSPEC 1.010  PHURINE 7.0  GLUCOSEU >=500*  HGBUR SMALL*  BILIRUBINUR NEGATIVE  KETONESUR NEGATIVE  PROTEINUR 100*  NITRITE NEGATIVE  LEUKOCYTESUR LARGE*      Imaging: US RENAL Result Date: 08/19/2023 CLINICAL DATA:  Acute kidney injury. EXAM: RENAL / URINARY TRACT ULTRASOUND COMPLETE COMPARISON:  05/29/2023 FINDINGS: Right Kidney: Renal measurements: 10.2 x 3.7 x 5.4 cm = volume: 165 mL. Cortical atrophy and mildly increased cortical echogenicity. No hydronephrosis, focal mass or shadowing calculi. Left Kidney: Renal measurements: 11.2 x 4.9 x 4.9 cm = volume: 139 mL. Cortical atrophy and mildly increased cortical echogenicity. No hydronephrosis, focal mass or shadowing calculi. Bladder: Sonographer states that the patient refused further imaging after imaging of the kidneys and therefore the bladder was not imaged. Other: None. IMPRESSION: Bilateral renal cortical atrophy and mildly increased  cortical echogenicity consistent with chronic kidney disease. No hydronephrosis. Electronically Signed   By: Irish Lack M.D.   On: 08/19/2023 16:55     Medications:    cefTAZidime (FORTAZ)  IV 1 g (08/21/23 1053)   linezolid (ZYVOX) IV 600 mg (08/21/23 0931)   sodium bicarbonate 150 mEq in dextrose 5 % 1,150 mL infusion 75 mL/hr at 08/21/23 0124    aspirin EC  81 mg Oral Daily   clopidogrel  75 mg Oral Daily   feeding supplement (NEPRO CARB STEADY)  237 mL Oral TID BM   heparin  5,000 Units Subcutaneous Q8H  insulin aspart  0-9 Units Subcutaneous TID WC   insulin glargine-yfgn  10 Units Subcutaneous QHS   leptospermum manuka honey  1 Application Topical Daily   levothyroxine  25 mcg Oral Q0600   liver oil-zinc oxide   Topical BID   pantoprazole (PROTONIX) IV  40 mg Intravenous Q12H   sodium zirconium cyclosilicate  10 g Oral BID   oxyCODONE  Assessment/ Plan:  [name] is a 68 y.o.  male with past medical concerns including diabetes, PAD with bilateral BKA, CKD 3a, and hypertension, who was admitted to [facility] on 08/18/2023 for Hyperkalemia [E87.5] AKI (acute kidney injury) (HCC) [N17.9]   Acute kidney injury with hyperkalemia on chronic kidney disease stage IIIa. Baseline creatinine appears to be 1.5 with GFR 50 on 04/17/2023. Acute kidney injury appears secondary to prolonged GI losses from vomiting. Will order renal ultrasound to evaluate for obstruction. Creatinine 6.37 with potassium 6.6 on presentation. -Creatinine continues to improve.  Adequate urine output noted.   -Potassium corrected to 4.4.  Will hold further Lokelma for now. -No acute indication for dialysis. -Will change IVF to normal saline 0.9% at 36ml/h  Lab Results  Component Value Date   CREATININE 3.83 (H) 08/21/2023   CREATININE 4.56 (H) 08/20/2023   CREATININE 5.87 (H) 08/19/2023    Intake/Output Summary (Last 24 hours) at 08/21/2023 1233 Last data filed at 08/21/2023 1200 Gross per 24 hour   Intake 2784.25 ml  Output 850 ml  Net 1934.25 ml   2. Anemia of chronic kidney disease Lab Results  Component Value Date   HGB 7.2 (L) 08/20/2023    Hgb decreased. Can consider ESA.   3. Hypertension with chronic kidney disease.  Home regimen includes lisinopril.  Currently held.    4. Diabetes mellitus type II with chronic kidney disease/renal manifestations: insulin dependent. Home regimen includes Lantus. Most recent hemoglobin A1c is 7.1 on 05/28/2023.      LOS: 3 Cadie Sorci 1/23/202512:33 PM

## 2023-08-21 NOTE — Progress Notes (Signed)
[facility]  - [place] [facility]   PATIENT NAME: Allen Chavez    MR#:  [id number]  DATE OF BIRTH:  03/25/1956  SUBJECTIVE:  Pt's friend at bedside. Patient had some issues with urine output. He tells me that he has been urinating and nurses have been documenting it. Discussed with RN to do bladder scan.    VITALS:  Blood pressure (!) 151/135, pulse (!) 59, temperature 98.1 F (36.7 C), resp. rate 18, height 6\' 2"  (1.88 m), weight 66.3 kg, SpO2 97%.  PHYSICAL EXAMINATION:   GENERAL:  68 y.o.-year-old patient with no acute distress.  LUNGS: Normal breath sounds bilaterally, no wheezing CARDIOVASCULAR: S1, S2 normal. No murmur   ABDOMEN: Soft, nontender, nondistended. Bowel sounds present.  EXTREMITIES: bilateral BKA with right BKA dressing present     NEUROLOGIC: nonfocal  patient is alert and awake SKIN:  per RN  LABORATORY PANEL:  CBC Recent Labs  Lab 08/20/23 0648  WBC 4.9  HGB 7.2*  HCT 22.3*  PLT 236    Chemistries  Recent Labs  Lab 08/18/23 1324 08/18/23 1931 08/21/23 0429  NA 140   < > 133*  K 6.6*   < > 4.4  CL 103   < > 98  CO2 22   < > 26  GLUCOSE 123*   < > 290*  BUN 108*   < > 72*  CREATININE 6.37*   < > 3.83*  CALCIUM 8.9   < > 8.1*  MG  --    < > 1.8  AST 21  --   --   ALT 13  --   --   ALKPHOS 96  --   --   BILITOT 1.3*  --   --    < > = values in this interval not displayed.   Cardiac Enzymes No results for input(s): "TROPONINI" in the last 168 hours. RADIOLOGY:  US RENAL Result Date: 08/19/2023 CLINICAL DATA:  Acute kidney injury. EXAM: RENAL / URINARY TRACT ULTRASOUND COMPLETE COMPARISON:  05/29/2023 FINDINGS: Right Kidney: Renal measurements: 10.2 x 3.7 x 5.4 cm = volume: 165 mL. Cortical atrophy and mildly increased cortical echogenicity. No hydronephrosis, focal mass or shadowing calculi. Left Kidney: Renal measurements: 11.2 x 4.9 x 4.9 cm = volume: 139 mL. Cortical atrophy and mildly increased cortical  echogenicity. No hydronephrosis, focal mass or shadowing calculi. Bladder: Sonographer states that the patient refused further imaging after imaging of the kidneys and therefore the bladder was not imaged. Other: None. IMPRESSION: Bilateral renal cortical atrophy and mildly increased cortical echogenicity consistent with chronic kidney disease. No hydronephrosis. Electronically Signed   By: Irish Lack M.D.   On: 08/19/2023 16:55    Assessment and Plan  Allen Chavez is a 68 y.o. male with medical history significant of DM2, PAD s/p bilateral BKA, CKD 3a, HTN who presented from SNF with report of vomiting of coffee-ground material.      AKI CKD 3a --Cr 6.37 on presentation, was 2.21 on 08/04/22, with baseline around 1.3.  AKI presumed due to dehydration. --s/p 1.5L bolus in the ED f/b MIVF --nephrology consulted --Cr improving down to 3.8 --cont MIVF@75  as Na bicarb gtt   Hyperkalemia --potassium 6.6 on presentation, 2/2 AKI.  Received temporizing agents in the ED. 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  ID pharm rec ceftazidime and Linezolid based on prior I/D culture results --wound RN consult--cont aggressive wound care -- spoke with vascular NP. They will remove patient sutures tomorrow. According to them if no improvement with minimum two weeks of antibiotics they would recommend AKA Patient in the past has refused.   PAD Remote hx of left BKA  Recent hx of right BKA --cont ASA --resume home plavix, statin (once done with linezolid)   Coffee-ground emesis  --reportedly had coffee-ground emesis at SNF.  Hgb 9.2, which is similar to recent baseline. --IV PPI BID --monitor for now.  Consider GI consult if Hgb drops.   DM2 --pt now on Semglee.  W/f hypoglycemia --ACHS and SSI for now   Hypotension --stable now   HTN, not currently active --hold home Lisinopril   Dysphagia --pt reported more difficulty with swallowing solids since Oct 2024 when his medical problems began.    Procedures: Family communication :friend at bedside Consults : nephrology CODE STATUS: full DVT Prophylaxis : heparin Level of care: Med-Surg Status is: Inpatient Remains inpatient appropriate because: AKI on CKD and amputation stomach infection  TOC fro d/c planning--per pt he does not want to return back to his previous facility    TOTAL TIME TAKING CARE OF THIS PATIENT: 35 minutes.  >50% time spent on counselling and coordination of care  Note: This dictation was prepared with Dragon dictation along with smaller phrase technology. Any transcriptional errors that result from this process are unintentional.  Enedina Finner M.D    [facility]   CC: Primary care physician; [name], MD

## 2023-08-21 NOTE — Plan of Care (Signed)

## 2023-08-22 DIAGNOSIS — N179 Acute kidney failure, unspecified: Secondary | ICD-10-CM | POA: Diagnosis not present

## 2023-08-22 LAB — GLUCOSE, CAPILLARY
Glucose-Capillary: 140 mg/dL — ABNORMAL HIGH (ref 70–99)
Glucose-Capillary: 192 mg/dL — ABNORMAL HIGH (ref 70–99)
Glucose-Capillary: 117 mg/dL — ABNORMAL HIGH (ref 70–99)
Glucose-Capillary: 241 mg/dL — ABNORMAL HIGH (ref 70–99)

## 2023-08-22 LAB — BASIC METABOLIC PANEL
Anion gap: 10 (ref 5–15)
BUN: 62 mg/dL — ABNORMAL HIGH (ref 8–23)
CO2: 26 mmol/L (ref 22–32)
Calcium: 8.2 mg/dL — ABNORMAL LOW (ref 8.9–10.3)
Chloride: 100 mmol/L (ref 98–111)
Creatinine, Ser: 3.34 mg/dL — ABNORMAL HIGH (ref 0.61–1.24)
GFR, Estimated: 19 mL/min — ABNORMAL LOW (ref >60)
Glucose, Bld: 173 mg/dL — ABNORMAL HIGH (ref 70–99)
Potassium: 4.2 mmol/L (ref 3.5–5.1)
Sodium: 136 mmol/L (ref 135–145)

## 2023-08-22 LAB — MAGNESIUM: Magnesium: 1.6 mg/dL — ABNORMAL LOW (ref 1.7–2.4)

## 2023-08-22 MED ORDER — PANTOPRAZOLE SODIUM 40 MG PO TBEC
40.0000 mg | DELAYED_RELEASE_TABLET | Freq: Every day | ORAL | Status: DC
  Administered 2023-08-22 – 2023-08-28 (×7): 40 mg via ORAL
  Filled 2023-08-22 (×7): qty 1

## 2023-08-22 MED ORDER — TAMSULOSIN HCL 0.4 MG PO CAPS
0.4000 mg | ORAL_CAPSULE | Freq: Every day | ORAL | Status: DC
  Administered 2023-08-22 – 2023-08-28 (×7): 0.4 mg via ORAL
  Filled 2023-08-22 (×7): qty 1

## 2023-08-22 MED ORDER — SODIUM CHLORIDE 0.9 % IV SOLN
2.0000 g | INTRAVENOUS | Status: DC
  Administered 2023-08-23: 2 g via INTRAVENOUS
  Filled 2023-08-22: qty 2

## 2023-08-22 NOTE — Plan of Care (Signed)

## 2023-08-22 NOTE — Progress Notes (Signed)
[facility]  ROUNDING NOTE   Subjective:   Patient sitting up in bed Alert and oriented.  Tolerating small meals  IV fluids to 50 mL/h  Creatinine 3.34 Urine output  Objective:  Vital signs in last 24 hours:  Temp:  [97.8 F (36.6 C)-98 F (36.7 C)] 98 F (36.7 C) (01/24 0815) Pulse Rate:  [69-84] 69 (01/24 0815) Resp:  [16-18] 17 (01/24 0815) BP: (105-117)/(56-58) 105/56 (01/24 0815) SpO2:  [96 %-99 %] 99 % (01/24 0815)  Weight change:  Filed Weights   08/18/23 1323  Weight: 66.3 kg    Intake/Output: I/O last 3 completed shifts: In: 3120.3 [P.O.:1180; I.V.:1244; IV Piggyback:696.2] Out: 950 [Urine:950]   Intake/Output this shift:  No intake/output data recorded.  Physical Exam: General: NAD  Head: Normocephalic, atraumatic. Moist oral mucosal membranes  Eyes: Anicteric  Lungs:  Clear to auscultation, normal effort  Heart: Regular rate and rhythm  Abdomen:  Soft, nontender  Extremities:  No peripheral edema.  Neurologic: Awake and alert, moving all four extremities  Skin: No lesions       Basic Metabolic Panel: Recent Labs  Lab 08/18/23 1324 08/18/23 1931 08/19/23 0041 08/19/23 0401 08/20/23 0648 08/21/23 0429 08/22/23 0558  NA 140  --   --  141 139 133* 136  K 6.6*   < > 5.6* 5.6* 5.1 4.4 4.2  CL 103  --   --  109 105 98 100  CO2 22  --   --  20* 25 26 26   GLUCOSE 123*  --   --  96 277* 290* 173*  BUN 108*  --   --  96* 83* 72* 62*  CREATININE 6.37*  --   --  5.87* 4.56* 3.83* 3.34*  CALCIUM 8.9  --   --  8.5* 8.4* 8.1* 8.2*  MG  --   --   --  1.8 2.0 1.8 1.6*   < > = values in this interval not displayed.    Liver Function Tests: Recent Labs  Lab 08/18/23 1324  AST 21  ALT 13  ALKPHOS 96  BILITOT 1.3*  PROT 7.2  ALBUMIN 2.9*   Recent Labs  Lab 08/18/23 1324  LIPASE 29   No results for input(s): "AMMONIA" in the last 168 hours.  CBC: Recent Labs  Lab 08/18/23 1324 08/19/23 0401 08/20/23 0648  WBC  9.6 7.7 4.9  NEUTROABS 7.6  --   --   HGB 9.2* 8.0* 7.2*  HCT 29.9* 24.8* 22.3*  MCV 88.5 87.6 85.4  PLT 335 298 236    Cardiac Enzymes: No results for input(s): "CKTOTAL", "CKMB", "CKMBINDEX", "TROPONINI" in the last 168 hours.  BNP: Invalid input(s): "POCBNP"  CBG: Recent Labs  Lab 08/21/23 0848 08/21/23 1118 08/21/23 1628 08/21/23 2103 08/22/23 0757  GLUCAP 239* 318* 254* 213* 140*    Microbiology: Results for orders placed or performed during the hospital encounter of 08/18/23  Urine Culture (for pregnant, neutropenic or urologic patients or patients with an indwelling urinary catheter)     Status: Abnormal (Preliminary result)   Collection Time: 08/21/23  5:10 AM   Specimen: Urine, Clean Catch  Result Value Ref Range Status   Specimen Description   Final    URINE, CLEAN CATCH Performed at [facility], [address] [place], [place] [address]    Special Requests   Final    NONE Performed at [facility], [address], [place], [place] [address]    Culture (A)  Final    >=100,000 COLONIES/mL ESCHERICHIA COLI SUSCEPTIBILITIES TO FOLLOW Performed at [facility], [address], [place], [place] [address]    Report Status PENDING  Incomplete    Coagulation Studies: No results for input(s): "LABPROT", "INR" in the last 72 hours.   Urinalysis: Recent Labs    08/21/23 0510  COLORURINE YELLOW*  LABSPEC 1.010  PHURINE 7.0  GLUCOSEU >=500*  HGBUR SMALL*  BILIRUBINUR NEGATIVE  KETONESUR NEGATIVE  PROTEINUR 100*  NITRITE NEGATIVE  LEUKOCYTESUR LARGE*      Imaging: No results found.    Medications:    sodium chloride 50 mL/hr at 08/21/23 1651   [START ON 08/23/2023] cefTAZidime (FORTAZ)  IV     linezolid (ZYVOX) IV 600 mg (08/21/23 2206)    aspirin EC  81 mg Oral Daily   clopidogrel  75 mg Oral Daily   feeding supplement (NEPRO CARB STEADY)  237 mL Oral TID BM   heparin  5,000 Units Subcutaneous Q8H   insulin  aspart  0-9 Units Subcutaneous TID WC   insulin glargine-yfgn  15 Units Subcutaneous QHS   leptospermum manuka honey  1 Application Topical Daily   levothyroxine  25 mcg Oral Q0600   liver oil-zinc oxide   Topical BID   pantoprazole (PROTONIX) IV  40 mg Intravenous Q12H   oxyCODONE  Assessment/ Plan:  [name] is a 68 y.o.  male with past medical concerns including diabetes, PAD with bilateral BKA, CKD 3a, and hypertension, who was admitted to [facility] on 08/18/2023 for Hyperkalemia [E87.5] AKI (acute kidney injury) (HCC) [N17.9]   Acute kidney injury with hyperkalemia on chronic kidney disease stage IIIa. Baseline creatinine appears to be 1.5 with GFR 50 on 04/17/2023. Acute kidney injury appears secondary to prolonged GI losses from vomiting. Will order renal ultrasound to evaluate for obstruction. Creatinine 6.37 with potassium 6.6 on presentation. -Renal function continues to improve with IVF -Potassium corrected to 4.2.  -No acute indication for dialysis. -Continue IVF  Lab Results  Component Value Date   CREATININE 3.34 (H) 08/22/2023   CREATININE 3.83 (H) 08/21/2023   CREATININE 4.56 (H) 08/20/2023    Intake/Output Summary (Last 24 hours) at 08/22/2023 1034 Last data filed at 08/22/2023 0213 Gross per 24 hour  Intake 2540.25 ml  Output 500 ml  Net 2040.25 ml   2. Anemia of chronic kidney disease Lab Results  Component Value Date   HGB 7.2 (L) 08/20/2023    Hgb decreased. Can consider ESA.   3. Hypertension with chronic kidney disease.  Home regimen includes lisinopril.  Currently held. Blood pressure stable   4. Diabetes mellitus type II with chronic kidney disease/renal manifestations: insulin dependent. Home regimen includes Lantus. Most recent hemoglobin A1c is 7.1 on 05/28/2023.      LOS: 4 Allen Chavez 1/24/202510:34 AM

## 2023-08-22 NOTE — TOC Progression Note (Signed)
Transition of Care [facility]) - Progression Note    Patient Details  Name: Allen Chavez MRN: [id number] Date of Birth: 1955/07/31  [facility] CM/SW Contact  Garret Reddish, RN Phone Number: 08/22/2023, 3:47 PM  Clinical Narrative:    Chart reviewed.  Noted that patient was admitted with Acute Kidney Injury.  Vascular surgery following patient.  Patient currently on IV Ceftazidime and IV Linezolid for positive wound cultures.  Patient has right and left BKA.    I have spoken with  Danella Deis, Admission Coordinator from [facility].  She informs me that patient was receiving short-term at the facility.  Danella Deis reports that patient will need a SNF approval prior to returning back to the facility.    TOC will continue to follow for discharge planning.          Expected Discharge Plan and Services                                               Social Determinants of Health (SDOH) Interventions SDOH Screenings   Food Insecurity: No Food Insecurity (08/19/2023)  Housing: Low Risk  (08/19/2023)  Transportation Needs: No Transportation Needs (08/19/2023)  Utilities: Not At Risk (08/19/2023)  Financial Resource Strain: Patient Declined (09/30/2022)   Received from [facility], [facility]  Tobacco Use: High Risk (08/18/2023)    Readmission Risk Interventions    06/06/2023   11:28 AM  Readmission Risk Prevention Plan  Transportation Screening Complete  PCP or Specialist Appt within 3-5 Days Complete  Social Work Consult for Recovery Care Planning/Counseling Complete  Palliative Care Screening Not Applicable  Medication Review Oceanographer) Referral to Pharmacy
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
## 2023-08-22 NOTE — Progress Notes (Addendum)
Pharmacy Antibiotic Note  Allen Chavez is a 68 y.o. male admitted on 08/18/2023 with wound infection at R BKA site.  Pharmacy has been consulted for ceftazidime dosing. Patient had I&D of BKA site on 1/8 and cultures grew E coli, pseudomonas, and MRSA.  Vascular NP has note of starting ciprofloxacin 1/10 (contacting SNF to start).  Patient presents with AKA  Today, 08/22/2023 D4 linezolid and ceftazidime Renal: AKI on CKD - SCr improving - CrCl 20 ml/min WBC WNL Platelet count 236 Afebrile 1/8 wound culture: P aeruginosa (Susc ceftaz, cipro, pip/tazo, imi, gent), E coli (resistant to ampicillin only), MRSA (susc to tetracycline and linezolid)  No drainage at this time to send for culture  Plan: Increase to Ceftazidime 2gm IV q24h per current CrCl Monitor renal function closely Monitor for thrombocytopenia with linezolid especially in light of AKI.   Based on recent culture result, at discharge, consider doxycycline 100mg  Po BID plus ciprofloxacin 500mg  daily (increase to 500mg  BID if CrCl greater than 30 ml/min)  Height: 6\' 2"  (188 cm) Weight: 66.3 kg (146 lb 3.2 oz) IBW/kg (Calculated) : 82.2  Temp (24hrs), Avg:97.9 F (36.6 C), Min:97.8 F (36.6 C), Max:98 F (36.7 C)  Recent Labs  Lab 08/18/23 1324 08/19/23 0401 08/20/23 0648 08/21/23 0429 08/22/23 0558  WBC 9.6 7.7 4.9  --   --   CREATININE 6.37* 5.87* 4.56* 3.83* 3.34*    Estimated Creatinine Clearance: 19.6 mL/min (A) (by C-G formula based on SCr of 3.34 mg/dL (H)).    No Known Allergies   Thank you for allowing pharmacy to be a part of this patient's care.  Juliette Alcide, PharmD, BCPS, BCIDP Work Cell: [phone] 08/22/2023 10:22 AM

## 2023-08-22 NOTE — Progress Notes (Signed)
[facility]  - [place] [facility]   PATIENT NAME: Allen Chavez    MR#:  [id number]  DATE OF BIRTH:  [birth year]  SUBJECTIVE:  No family at bedside. Patient had some issues with urine output. He tells me that he has been urinating and nurses have been documenting it. Discussed with RN to do bladder scan--800 cc --I an o today    VITALS:  Blood pressure (!) 105/56, pulse 69, temperature 98 F (36.7 C), resp. rate 17, height 6\' 2"  (1.88 m), weight 66.3 kg, SpO2 99%.  PHYSICAL EXAMINATION:   GENERAL:  68 y.o.-year-old patient with no acute distress.  LUNGS: Normal breath sounds bilaterally, no wheezing CARDIOVASCULAR: S1, S2 normal. No murmur   ABDOMEN: Soft, nontender, nondistended. Bowel sounds present.  EXTREMITIES: bilateral BKA with right BKA dressing present     NEUROLOGIC: nonfocal  patient is alert and awake SKIN:  per RN  LABORATORY PANEL:  CBC Recent Labs  Lab 08/20/23 0648  WBC 4.9  HGB 7.2*  HCT 22.3*  PLT 236    Chemistries  Recent Labs  Lab 08/18/23 1324 08/18/23 1931 08/22/23 0558  NA 140   < > 136  K 6.6*   < > 4.2  CL 103   < > 100  CO2 22   < > 26  GLUCOSE 123*   < > 173*  BUN 108*   < > 62*  CREATININE 6.37*   < > 3.34*  CALCIUM 8.9   < > 8.2*  MG  --    < > 1.6*  AST 21  --   --   ALT 13  --   --   ALKPHOS 96  --   --   BILITOT 1.3*  --   --    < > = values in this interval not displayed.   Cardiac Enzymes No results for input(s): "TROPONINI" in the last 168 hours. RADIOLOGY:  No results found.   Assessment and Plan  Allen Chavez is a 68 y.o. male with medical history significant of DM2, PAD s/p bilateral BKA, CKD 3a, HTN who presented from SNF with report of vomiting of coffee-ground material.      AKI CKD 3a --Cr 6.37 on presentation, was 2.21 on 08/04/22, with baseline around 1.3.  AKI presumed due to dehydration. --s/p 1.5L bolus in the ED f/b MIVF --nephrology consulted --Cr improving down to 3.8 --cont  MIVF@75  as Na bicarb gtt  Acute urinary retention--incomplete emptying E coli UTI --in and out --flomax --cont Iv abxs   Hyperkalemia --potassium 6.6 on presentation, 2/2 AKI.  Received temporizing agents in the ED. 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  ID pharm rec ceftazidime and Linezolid based on prior I/D culture results --wound RN consult--cont aggressive wound care -- spoke with vascular NP. They will remove patient sutures tomorrow. According to them if no improvement with minimum two weeks of antibiotics they would recommend AKA Patient in the past has refused.   PAD Remote hx of left BKA  Recent hx of right BKA --cont ASA --resume home plavix, statin (once done with linezolid)   Coffee-ground emesis  --reportedly had coffee-ground emesis at SNF.  Hgb 9.2, which is similar to recent baseline. --IV PPI BID --monitor for now.  Consider GI consult if Hgb drops.   DM2 --pt now on Semglee. W/f hypoglycemia --ACHS and SSI for now   Hypotension --stable now   HTN, not currently active --hold home Lisinopril   Dysphagia --pt reported more difficulty with swallowing solids since Oct 2024 when his medical problems began.    Procedures: Family communication :friend at bedside Consults : nephrology CODE STATUS: full DVT Prophylaxis : heparin Level of care: Med-Surg Status is: Inpatient Remains inpatient appropriate because: AKI on CKD and amputation stomach infection  TOC fro d/c planning--per pt he does not want to return back to his previous facility    TOTAL TIME TAKING CARE OF THIS PATIENT: 35 minutes.  >50% time spent on counselling and coordination of care  Note: This dictation was prepared with Dragon dictation along  with smaller phrase technology. Any transcriptional errors that result from this process are unintentional.  Enedina Finner M.D    [facility]   CC: Primary care physician; [name], MD

## 2023-08-22 NOTE — Progress Notes (Signed)
PT Cancellation Note  Patient Details Name: Allen Chavez MRN: [id number] DOB: [birth year]   Cancelled Treatment:    Reason Eval/Treat Not Completed: Other (comment). Upon entry pt politely declining PT stating he has had a "rough couple of days". Educated on importance of OOB mobility. States he has been transferring with nursing to <> from recliner. Agreeable to participate with f/u from PT at a later time/date.    Delphia Grates. Fairly IV, PT, DPT Physical Therapist- [place]  [facility]  08/22/2023, 2:58 PM

## 2023-08-23 DIAGNOSIS — N179 Acute kidney failure, unspecified: Secondary | ICD-10-CM | POA: Diagnosis not present

## 2023-08-23 LAB — GLUCOSE, CAPILLARY
Glucose-Capillary: 131 mg/dL — ABNORMAL HIGH (ref 70–99)
Glucose-Capillary: 133 mg/dL — ABNORMAL HIGH (ref 70–99)
Glucose-Capillary: 229 mg/dL — ABNORMAL HIGH (ref 70–99)
Glucose-Capillary: 224 mg/dL — ABNORMAL HIGH (ref 70–99)
Glucose-Capillary: 241 mg/dL — ABNORMAL HIGH (ref 70–99)

## 2023-08-23 LAB — MAGNESIUM: Magnesium: 1.6 mg/dL — ABNORMAL LOW (ref 1.7–2.4)

## 2023-08-23 LAB — URINE CULTURE: Culture: >=100000 — AB

## 2023-08-23 MED ORDER — DOXYCYCLINE HYCLATE 100 MG PO TABS
100.0000 mg | ORAL_TABLET | Freq: Two times a day (BID) | ORAL | Status: DC
  Administered 2023-08-23 – 2023-08-28 (×10): 100 mg via ORAL
  Filled 2023-08-23 (×10): qty 1

## 2023-08-23 MED ORDER — SODIUM CHLORIDE 0.9 % IV SOLN
500.0000 mg | Freq: Two times a day (BID) | INTRAVENOUS | Status: DC
  Administered 2023-08-23 – 2023-08-24 (×2): 500 mg via INTRAVENOUS
  Filled 2023-08-23 (×3): qty 10

## 2023-08-23 MED ORDER — CHLORHEXIDINE GLUCONATE CLOTH 2 % EX PADS
6.0000 | MEDICATED_PAD | Freq: Every day | CUTANEOUS | Status: DC
  Administered 2023-08-23 – 2023-08-27 (×5): 6 via TOPICAL

## 2023-08-23 MED ORDER — MAGNESIUM SULFATE 2 GM/50ML IV SOLN
2.0000 g | Freq: Once | INTRAVENOUS | Status: AC
  Administered 2023-08-23: 2 g via INTRAVENOUS
  Filled 2023-08-23: qty 50

## 2023-08-23 NOTE — Progress Notes (Signed)
[facility]  - [place] at [facility]   PATIENT NAME: Allen Chavez    MR#:  [id number]  DATE OF BIRTH:  1956-07-15  SUBJECTIVE:  No family at bedside. Continous to retain urine. Foley placed--800 cc UOP     VITALS:  Blood pressure (!) 118/55, pulse 68, temperature 98 F (36.7 C), resp. rate 18, height 6\' 2"  (1.88 m), weight 66.3 kg, SpO2 99%.  PHYSICAL EXAMINATION:   GENERAL:  68 y.o.-year-old patient with no acute distress.  LUNGS: Normal breath sounds bilaterally, no wheezing CARDIOVASCULAR: S1, S2 normal. No murmur   ABDOMEN: Soft, nontender, nondistended. Bowel sounds present. FOLEY+ EXTREMITIES: bilateral BKA with right BKA dressing present     NEUROLOGIC: nonfocal  patient is alert and awake SKIN:  per RN  LABORATORY PANEL:  CBC Recent Labs  Lab 08/20/23 0648  WBC 4.9  HGB 7.2*  HCT 22.3*  PLT 236    Chemistries  Recent Labs  Lab 08/18/23 1324 08/18/23 1931 08/22/23 0558 08/23/23 0306  NA 140   < > 136  --   K 6.6*   < > 4.2  --   CL 103   < > 100  --   CO2 22   < > 26  --   GLUCOSE 123*   < > 173*  --   BUN 108*   < > 62*  --   CREATININE 6.37*   < > 3.34*  --   CALCIUM 8.9   < > 8.2*  --   MG  --    < > 1.6* 1.6*  AST 21  --   --   --   ALT 13  --   --   --   ALKPHOS 96  --   --   --   BILITOT 1.3*  --   --   --    < > = values in this interval not displayed.   Cardiac Enzymes No results for input(s): "TROPONINI" in the last 168 hours. RADIOLOGY:  No results found.   Assessment and Plan  Allen Chavez is a 68 y.o. male with medical history significant of DM2, PAD s/p bilateral BKA, CKD 3a, HTN who presented from SNF with report of vomiting of coffee-ground material.      AKI CKD 3a --Cr 6.37 on presentation, was 2.21 on 08/04/22, with baseline around 1.3.  AKI presumed due to dehydration. --s/p 1.5L bolus in the ED f/b MIVF --nephrology consulted --Cr improving down to 3.3 --cont MIVF@75  as Na bicarb gtt  Acute  urinary retention--incomplete emptying E coli UTI--ESBL --in and out --flomax --d/w RPH to see if abx can be changed to oral   Hyperkalemia --potassium 6.6 on presentation, 2/2 AKI.  Received temporizing agents in the ED. 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  ID pharm rec ceftazidime and Linezolid based on prior I/D culture results--change to po  --wound RN consult--cont aggressive wound care -- spoke with vascular NP. They will remove patient sutures tomorrow. According to them if no improvement with minimum two weeks of antibiotics they would recommend AKA Patient in the past has refused.   PAD Remote hx of left BKA  Recent hx of right BKA --cont ASA --resume home plavix, statin (once done with linezolid)  Coffee-ground emesis  --reportedly had coffee-ground emesis at SNF.  Hgb 9.2, which is similar to recent baseline. --PPI BID    DM2 --pt now on Semglee. W/f hypoglycemia --ACHS and SSI for now   Hypotension --stable now   HTN, not currently active --hold home Lisinopril   Dysphagia --pt reported more difficulty with swallowing solids since Oct 2024 when his medical problems began.    Procedures: Family communication :friend at bedside Consults : nephrology CODE STATUS: full DVT Prophylaxis : heparin Level of care: Med-Surg Status is: Inpatient Remains inpatient appropriate because: AKI on CKD and amputation stomach infection  TOC fro d/c planning--per pt he does not want to return back to his previous facility    TOTAL TIME TAKING CARE OF THIS PATIENT: 35 minutes.  >50% time spent on counselling and coordination of care  Note: This dictation was prepared with Dragon dictation along with smaller phrase technology. Any  transcriptional errors that result from this process are unintentional.  Enedina Finner M.D    [facility]   CC: Primary care physician; [name], MD

## 2023-08-23 NOTE — Plan of Care (Signed)

## 2023-08-23 NOTE — Progress Notes (Signed)
[facility]  ROUNDING NOTE   Subjective:   Patient seen sitting up in bed Completed breakfast tray at bedside Remains on room air  Nursing at bedside report urinary retention, patient to straight cath.  IV fluids to 50 mL/h  Urine output  Objective:  Vital signs in last 24 hours:  Temp:  [98 F (36.7 C)-98.4 F (36.9 C)] 98 F (36.7 C) (01/25 0923) Pulse Rate:  [66-72] 68 (01/25 0923) Resp:  [16-18] 18 (01/25 0923) BP: (108-125)/(48-55) 118/55 (01/25 0923) SpO2:  [96 %-100 %] 99 % (01/25 0923)  Weight change:  Filed Weights   08/18/23 1323  Weight: 66.3 kg    Intake/Output: I/O last 3 completed shifts: In: 480 [P.O.:480] Out: 2150 [Urine:2150]   Intake/Output this shift:  No intake/output data recorded.  Physical Exam: General: NAD  Head: Normocephalic, atraumatic. Moist oral mucosal membranes  Eyes: Anicteric  Lungs:  Clear to auscultation, normal effort  Heart: Regular rate and rhythm  Abdomen:  Soft, nontender  Extremities:  No peripheral edema.  Bilateral BKA  Neurologic: Awake and alert, moving all four extremities  Skin: No lesions       Basic Metabolic Panel: Recent Labs  Lab 08/18/23 1324 08/18/23 1931 08/19/23 0041 08/19/23 0401 08/20/23 0648 08/21/23 0429 08/22/23 0558 08/23/23 0306  NA 140  --   --  141 139 133* 136  --   K 6.6*   < > 5.6* 5.6* 5.1 4.4 4.2  --   CL 103  --   --  109 105 98 100  --   CO2 22  --   --  20* 25 26 26   --   GLUCOSE 123*  --   --  96 277* 290* 173*  --   BUN 108*  --   --  96* 83* 72* 62*  --   CREATININE 6.37*  --   --  5.87* 4.56* 3.83* 3.34*  --   CALCIUM 8.9  --   --  8.5* 8.4* 8.1* 8.2*  --   MG  --   --   --  1.8 2.0 1.8 1.6* 1.6*   < > = values in this interval not displayed.    Liver Function Tests: Recent Labs  Lab 08/18/23 1324  AST 21  ALT 13  ALKPHOS 96  BILITOT 1.3*  PROT 7.2  ALBUMIN 2.9*   Recent Labs  Lab 08/18/23 1324  LIPASE 29   No results for  input(s): "AMMONIA" in the last 168 hours.  CBC: Recent Labs  Lab 08/18/23 1324 08/19/23 0401 08/20/23 0648  WBC 9.6 7.7 4.9  NEUTROABS 7.6  --   --   HGB 9.2* 8.0* 7.2*  HCT 29.9* 24.8* 22.3*  MCV 88.5 87.6 85.4  PLT 335 298 236    Cardiac Enzymes: No results for input(s): "CKTOTAL", "CKMB", "CKMBINDEX", "TROPONINI" in the last 168 hours.  BNP: Invalid input(s): "POCBNP"  CBG: Recent Labs  Lab 08/22/23 1148 08/22/23 1648 08/22/23 2037 08/23/23 0925 08/23/23 0949  GLUCAP 192* 117* 241* 131* 133*    Microbiology: Results for orders placed or performed during the hospital encounter of 08/18/23  Urine Culture (for pregnant, neutropenic or urologic patients or patients with an indwelling urinary catheter)     Status: Abnormal (Preliminary result)   Collection Time: 08/21/23  5:10 AM   Specimen: Urine, Clean Catch  Result Value Ref Range Status   Specimen Description   Final    URINE, CLEAN CATCH Performed at [employer]  [facility], [address], [place], [place] [address]    Special Requests   Final    NONE Performed at [facility], [address] [place], [place] [address]    Culture (A)  Final    >=100,000 COLONIES/mL ESCHERICHIA COLI SUSCEPTIBILITIES TO FOLLOW Performed at [facility], [address] [place], [place] [address]    Report Status PENDING  Incomplete    Coagulation Studies: No results for input(s): "LABPROT", "INR" in the last 72 hours.   Urinalysis: Recent Labs    08/21/23 0510  COLORURINE YELLOW*  LABSPEC 1.010  PHURINE 7.0  GLUCOSEU >=500*  HGBUR SMALL*  BILIRUBINUR NEGATIVE  KETONESUR NEGATIVE  PROTEINUR 100*  NITRITE NEGATIVE  LEUKOCYTESUR LARGE*      Imaging: No results found.    Medications:    sodium chloride 50 mL/hr at 08/22/23 1331   cefTAZidime (FORTAZ)  IV     linezolid (ZYVOX) IV 600 mg (08/23/23 0940)   magnesium sulfate bolus IVPB      aspirin EC  81 mg Oral Daily   clopidogrel   75 mg Oral Daily   feeding supplement (NEPRO CARB STEADY)  237 mL Oral TID BM   heparin  5,000 Units Subcutaneous Q8H   insulin aspart  0-9 Units Subcutaneous TID WC   insulin glargine-yfgn  15 Units Subcutaneous QHS   leptospermum manuka honey  1 Application Topical Daily   levothyroxine  25 mcg Oral Q0600   liver oil-zinc oxide   Topical BID   pantoprazole  40 mg Oral Daily   tamsulosin  0.4 mg Oral Daily   oxyCODONE  Assessment/ Plan:  [name] is a 68 y.o.  male with past medical concerns including diabetes, PAD with bilateral BKA, CKD 3a, and hypertension, who was admitted to [facility] on 08/18/2023 for Hyperkalemia [E87.5] AKI (acute kidney injury) (HCC) [N17.9]   Acute kidney injury with hyperkalemia on chronic kidney disease stage IIIa. Baseline creatinine appears to be 1.5 with GFR 50 on 04/17/2023. Acute kidney injury appears secondary to prolonged GI losses from vomiting. Will order renal ultrasound to evaluate for obstruction. Creatinine 6.37 with potassium 6.6 on presentation. -No updated labs available today. -Urine output recorded at 2 L in previous 24 hours. -Continue IVF  Lab Results  Component Value Date   CREATININE 3.34 (H) 08/22/2023   CREATININE 3.83 (H) 08/21/2023   CREATININE 4.56 (H) 08/20/2023    Intake/Output Summary (Last 24 hours) at 08/23/2023 1132 Last data filed at 08/23/2023 0200 Gross per 24 hour  Intake --  Output 2050 ml  Net -2050 ml   2. Anemia of chronic kidney disease Lab Results  Component Value Date   HGB 7.2 (L) 08/20/2023    Hemoglobin below desired target.  Will monitor for now and defer need of transfusion to primary team.  3. Hypertension with chronic kidney disease.  Home regimen includes lisinopril.  Currently held.    4. Diabetes mellitus type II with chronic kidney disease/renal manifestations: insulin dependent. Home regimen includes Lantus. Most recent hemoglobin A1c is 7.1 on 05/28/2023.   Glucose well-controlled     LOS: 5 Allen Chavez 1/25/202511:32 AM

## 2023-08-24 DIAGNOSIS — N179 Acute kidney failure, unspecified: Secondary | ICD-10-CM | POA: Diagnosis not present

## 2023-08-24 LAB — BASIC METABOLIC PANEL
Anion gap: 5 (ref 5–15)
BUN: 40 mg/dL — ABNORMAL HIGH (ref 8–23)
CO2: 24 mmol/L (ref 22–32)
Calcium: 8.3 mg/dL — ABNORMAL LOW (ref 8.9–10.3)
Chloride: 107 mmol/L (ref 98–111)
Creatinine, Ser: 1.99 mg/dL — ABNORMAL HIGH (ref 0.61–1.24)
GFR, Estimated: 36 mL/min — ABNORMAL LOW (ref >60)
Glucose, Bld: 167 mg/dL — ABNORMAL HIGH (ref 70–99)
Potassium: 4.3 mmol/L (ref 3.5–5.1)
Sodium: 136 mmol/L (ref 135–145)

## 2023-08-24 LAB — CBC
HCT: 22.6 % — ABNORMAL LOW (ref 39.0–52.0)
Hemoglobin: 7.3 g/dL — ABNORMAL LOW (ref 13.0–17.0)
MCH: 28 pg (ref 26.0–34.0)
MCHC: 32.3 g/dL (ref 30.0–36.0)
MCV: 86.6 fL (ref 80.0–100.0)
Platelets: 158 10*3/uL (ref 150–400)
RBC: 2.61 MIL/uL — ABNORMAL LOW (ref 4.22–5.81)
RDW: 15.7 % — ABNORMAL HIGH (ref 11.5–15.5)
WBC: 4.9 10*3/uL (ref 4.0–10.5)
nRBC: 0 % (ref 0.0–0.2)

## 2023-08-24 LAB — GLUCOSE, CAPILLARY
Glucose-Capillary: 146 mg/dL — ABNORMAL HIGH (ref 70–99)
Glucose-Capillary: 189 mg/dL — ABNORMAL HIGH (ref 70–99)
Glucose-Capillary: 145 mg/dL — ABNORMAL HIGH (ref 70–99)
Glucose-Capillary: 219 mg/dL — ABNORMAL HIGH (ref 70–99)

## 2023-08-24 MED ORDER — SENNOSIDES-DOCUSATE SODIUM 8.6-50 MG PO TABS
1.0000 | ORAL_TABLET | Freq: Two times a day (BID) | ORAL | Status: DC
  Administered 2023-08-24 – 2023-08-28 (×7): 1 via ORAL
  Filled 2023-08-24 (×7): qty 1

## 2023-08-24 MED ORDER — SODIUM CHLORIDE 0.9 % IV SOLN
1.0000 g | Freq: Two times a day (BID) | INTRAVENOUS | Status: DC
  Administered 2023-08-24 – 2023-08-25 (×3): 1 g via INTRAVENOUS
  Filled 2023-08-24 (×4): qty 20

## 2023-08-24 MED ORDER — INSULIN GLARGINE-YFGN 100 UNIT/ML ~~LOC~~ SOLN
20.0000 [IU] | Freq: Every day | SUBCUTANEOUS | Status: DC
  Administered 2023-08-24 – 2023-08-25 (×2): 20 [IU] via SUBCUTANEOUS
  Filled 2023-08-24 (×2): qty 0.2

## 2023-08-24 MED ORDER — POLYSACCHARIDE IRON COMPLEX 150 MG PO CAPS
150.0000 mg | ORAL_CAPSULE | Freq: Every day | ORAL | Status: DC
  Administered 2023-08-24 – 2023-08-28 (×5): 150 mg via ORAL
  Filled 2023-08-24 (×5): qty 1

## 2023-08-24 NOTE — Progress Notes (Signed)
[facility]  ROUNDING NOTE   Subjective:   Patient seen sitting up in bed Nursing at bedside Room air Tolerating meals  Creatinine 1.99  Foley catheter placed-1.6 L of urine recorded  Objective:  Vital signs in last 24 hours:  Temp:  [97.7 F (36.5 C)-98.4 F (36.9 C)] 98.3 F (36.8 C) (01/26 0829) Pulse Rate:  [58-76] 58 (01/26 0829) Resp:  [16] 16 (01/26 0829) BP: (110-147)/(59-66) 140/60 (01/26 0829) SpO2:  [99 %-100 %] 100 % (01/26 0829)  Weight change:  Filed Weights   08/18/23 1323  Weight: 66.3 kg    Intake/Output: I/O last 3 completed shifts: In: 3477.2 [P.O.:900; I.V.:2477.2; IV Piggyback:100] Out: 2475 [Urine:2475]   Intake/Output this shift:  Total I/O In: 566.4 [P.O.:260; I.V.:272.5; IV Piggyback:33.9] Out: 775 [Urine:775]  Physical Exam: General: NAD  Head: Normocephalic, atraumatic. Moist oral mucosal membranes  Eyes: Anicteric  Lungs:  Clear to auscultation, normal effort  Heart: Regular rate and rhythm  Abdomen:  Soft, nontender  Extremities:  No peripheral edema.  Bilateral BKA  Neurologic: Awake and alert, moving all four extremities  Skin: No lesions       Basic Metabolic Panel: Recent Labs  Lab 08/19/23 0401 08/20/23 0648 08/21/23 0429 08/22/23 0558 08/23/23 0306 08/24/23 0757  NA 141 139 133* 136  --  136  K 5.6* 5.1 4.4 4.2  --  4.3  CL 109 105 98 100  --  107  CO2 20* 25 26 26   --  24  GLUCOSE 96 277* 290* 173*  --  167*  BUN 96* 83* 72* 62*  --  40*  CREATININE 5.87* 4.56* 3.83* 3.34*  --  1.99*  CALCIUM 8.5* 8.4* 8.1* 8.2*  --  8.3*  MG 1.8 2.0 1.8 1.6* 1.6*  --     Liver Function Tests: Recent Labs  Lab 08/18/23 1324  AST 21  ALT 13  ALKPHOS 96  BILITOT 1.3*  PROT 7.2  ALBUMIN 2.9*   Recent Labs  Lab 08/18/23 1324  LIPASE 29   No results for input(s): "AMMONIA" in the last 168 hours.  CBC: Recent Labs  Lab 08/18/23 1324 08/19/23 0401 08/20/23 0648 08/24/23 1019  WBC 9.6 7.7 4.9  4.9  NEUTROABS 7.6  --   --   --   HGB 9.2* 8.0* 7.2* 7.3*  HCT 29.9* 24.8* 22.3* 22.6*  MCV 88.5 87.6 85.4 86.6  PLT 335 298 236 158    Cardiac Enzymes: No results for input(s): "CKTOTAL", "CKMB", "CKMBINDEX", "TROPONINI" in the last 168 hours.  BNP: Invalid input(s): "POCBNP"  CBG: Recent Labs  Lab 08/23/23 0949 08/23/23 1132 08/23/23 1647 08/23/23 2208 08/24/23 0826  GLUCAP 133* 229* 224* 241* 146*    Microbiology: Results for orders placed or performed during the hospital encounter of 08/18/23  Urine Culture (for pregnant, neutropenic or urologic patients or patients with an indwelling urinary catheter)     Status: Abnormal   Collection Time: 08/21/23  5:10 AM   Specimen: Urine, Clean Catch  Result Value Ref Range Status   Specimen Description   Final    URINE, CLEAN CATCH Performed at [facility], [address], [place], [place] [address]    Special Requests   Final    NONE Performed at [facility], [address] [place], [place] [address]    Culture (A)  Final    >=100,000 COLONIES/mL ESCHERICHIA COLI Confirmed Extended Spectrum Beta-Lactamase Producer (ESBL).  In bloodstream infections from ESBL organisms, carbapenems are  preferred over piperacillin/tazobactam. They are shown to have a lower risk of mortality.    Report Status 08/23/2023 FINAL  Final   Organism ID, Bacteria ESCHERICHIA COLI (A)  Final      Susceptibility   Escherichia coli - MIC*    AMPICILLIN >=32 RESISTANT Resistant     CEFAZOLIN >=64 RESISTANT Resistant     CEFTRIAXONE >=64 RESISTANT Resistant     CIPROFLOXACIN >=4 RESISTANT Resistant     GENTAMICIN >=16 RESISTANT Resistant     IMIPENEM <=0.25 SENSITIVE Sensitive     NITROFURANTOIN <=16 SENSITIVE Sensitive     TRIMETH/SULFA <=20 SENSITIVE Sensitive     AMPICILLIN/SULBACTAM >=32 RESISTANT Resistant     PIP/TAZO <=4 SENSITIVE Sensitive ug/mL    * >=100,000 COLONIES/mL ESCHERICHIA COLI    Coagulation  Studies: No results for input(s): "LABPROT", "INR" in the last 72 hours.   Urinalysis: No results for input(s): "COLORURINE", "LABSPEC", "PHURINE", "GLUCOSEU", "HGBUR", "BILIRUBINUR", "KETONESUR", "PROTEINUR", "UROBILINOGEN", "NITRITE", "LEUKOCYTESUR" in the last 72 hours.  Invalid input(s): "APPERANCEUR"     Imaging: No results found.    Medications:    meropenem (MERREM) IV 200 mL/hr at 08/24/23 1056    aspirin EC  81 mg Oral Daily   Chlorhexidine Gluconate Cloth  6 each Topical Daily   clopidogrel  75 mg Oral Daily   doxycycline  100 mg Oral Q12H   feeding supplement (NEPRO CARB STEADY)  237 mL Oral TID BM   heparin  5,000 Units Subcutaneous Q8H   insulin aspart  0-9 Units Subcutaneous TID WC   insulin glargine-yfgn  20 Units Subcutaneous QHS   leptospermum manuka honey  1 Application Topical Daily   levothyroxine  25 mcg Oral Q0600   liver oil-zinc oxide   Topical BID   pantoprazole  40 mg Oral Daily   senna-docusate  1 tablet Oral BID   tamsulosin  0.4 mg Oral Daily   oxyCODONE  Assessment/ Plan:  Allen Chavez is a 68 y.o.  male with past medical concerns including diabetes, PAD with bilateral BKA, CKD 3a, and hypertension, who was admitted to [facility] on 08/18/2023 for Hyperkalemia [E87.5] AKI (acute kidney injury) (HCC) [N17.9]   Acute kidney injury with hyperkalemia on chronic kidney disease stage IIIa. Baseline creatinine appears to be 1.5 with GFR 50 on 04/17/2023. Acute kidney injury appears secondary to prolonged GI losses from vomiting. Will order renal ultrasound to evaluate for obstruction. Creatinine 6.37 with potassium 6.6 on presentation. -Creatinine continues to improve towards baseline -Foley catheter placed due to urinary retention.  Adequate urine output noted -Appetite remains appropriate  Lab Results  Component Value Date   CREATININE 1.99 (H) 08/24/2023   CREATININE 3.34 (H) 08/22/2023   CREATININE 3.83 (H) 08/21/2023    Intake/Output  Summary (Last 24 hours) at 08/24/2023 1133 Last data filed at 08/24/2023 1056 Gross per 24 hour  Intake 3803.55 ml  Output 2400 ml  Net 1403.55 ml   2. Anemia of chronic kidney disease Lab Results  Component Value Date   HGB 7.3 (L) 08/24/2023    Will monitor for now and defer need of transfusion to primary team.  3. Hypertension with chronic kidney disease.  Home regimen includes lisinopril.  Currently held.  Blood pressure remains acceptable   4. Diabetes mellitus type II with chronic kidney disease/renal manifestations: insulin dependent. Home regimen includes Lantus. Most recent hemoglobin A1c is 7.1 on 05/28/2023.   Primary team to manage sliding scale insulin.  Due to renal recovery, we will sign  off at this time.    LOS: 6 Allen Chavez 1/26/202511:33 AM

## 2023-08-24 NOTE — Plan of Care (Signed)
  Problem: Coping: Goal: Ability to adjust to condition or change in health will improve Outcome: Progressing   Problem: Nutritional: Goal: Maintenance of adequate nutrition will improve Outcome: Progressing   Problem: Skin Integrity: Goal: Risk for impaired skin integrity will decrease Outcome: Progressing   Problem: Education: Goal: Knowledge of General Education information will improve Description: Including pain rating scale, medication(s)/side effects and non-pharmacologic comfort measures Outcome: Progressing   Problem: Activity: Goal: Risk for activity intolerance will decrease Outcome: Progressing   Problem: Coping: Goal: Level of anxiety will decrease Outcome: Progressing   Problem: Pain Managment: Goal: General experience of comfort will improve and/or be controlled Outcome: Progressing   Problem: Safety: Goal: Ability to remain free from injury will improve Outcome: Progressing   Problem: Skin Integrity: Goal: Risk for impaired skin integrity will decrease Outcome: Progressing
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
## 2023-08-24 NOTE — Plan of Care (Signed)

## 2023-08-24 NOTE — Progress Notes (Addendum)
[facility]  - [place] at [facility]   PATIENT NAME: Allen Chavez    MR#:  [id number]  DATE OF BIRTH:  30-Dec-1955  SUBJECTIVE:  No family at bedside. constipation     VITALS:  Blood pressure (!) 157/99, pulse (!) 58, temperature 98.3 F (36.8 C), temperature source Oral, resp. rate 16, height 6\' 2"  (1.88 m), weight 66.3 kg, SpO2 100%.  PHYSICAL EXAMINATION:   GENERAL:  68 y.o.-year-old patient with no acute distress.  LUNGS: Normal breath sounds bilaterally, no wheezing CARDIOVASCULAR: S1, S2 normal. No murmur   ABDOMEN: Soft, nontender, nondistended. Bowel sounds present. FOLEY+ EXTREMITIES: bilateral BKA with right BKA dressing present NEUROLOGIC: nonfocal  patient is alert and awake SKIN:  per RN  LABORATORY PANEL:  CBC Recent Labs  Lab 08/24/23 1019  WBC 4.9  HGB 7.3*  HCT 22.6*  PLT 158    Chemistries  Recent Labs  Lab 08/18/23 1324 08/18/23 1931 08/23/23 0306 08/24/23 0757  NA 140   < >  --  136  K 6.6*   < >  --  4.3  CL 103   < >  --  107  CO2 22   < >  --  24  GLUCOSE 123*   < >  --  167*  BUN 108*   < >  --  40*  CREATININE 6.37*   < >  --  1.99*  CALCIUM 8.9   < >  --  8.3*  MG  --    < > 1.6*  --   AST 21  --   --   --   ALT 13  --   --   --   ALKPHOS 96  --   --   --   BILITOT 1.3*  --   --   --    < > = values in this interval not displayed.   Cardiac Enzymes No results for input(s): "TROPONINI" in the last 168 hours. RADIOLOGY:  No results found.   Assessment and Plan  Allen Chavez is a 68 y.o. male with medical history significant of DM2, PAD s/p bilateral BKA, CKD 3a, HTN who presented from SNF with report of vomiting of coffee-ground material.      AKI on CKD 3b Anemia     of chronic disease --Cr 6.37 on presentation, was 2.21 on 08/04/22, with baseline around 1.3.  AKI presumed due to dehydration. --s/p 1.5L bolus in the ED f/b MIVF --nephrology consulted --Cr improving down to 1.99 (baseline) --c MIVF@75  as Na  bicarb gtt--d/c now  Acute urinary retention--incomplete emptying E coli UTI--ESBL --in and out --flomax --d/w RPH to see if abx can be changed to oral   Hyperkalemia --potassium 6.6 on presentation, 2/2 AKI.  Received temporizing agents in the ED. 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  ID pharm rec ceftazidime and Linezolid based on prior I/D culture results--change to po  --wound RN consult--cont aggressive wound care -- spoke with vascular NP. They will remove patient sutures tomorrow. According to them if no improvement with minimum two weeks of antibiotics they would recommend AKA Patient in the past has refused. --1/26--pt now considering AKA and wants to wait till tomorrow   PAD Remote hx of left BKA  Recent hx of right BKA --cont ASA --resume home plavix, statin (once  done with linezolid)   Coffee-ground emesis  --reportedly had coffee-ground emesis at SNF.  Hgb 9.2, which is similar to recent baseline. --PPI BID  DM2 with CKD 3b --pt now on Semglee. W/f hypoglycemia --ACHS and SSI for now   Hypotension --stable now   HTN, not currently active --hold home Lisinopril   Dysphagia --stable   Procedures: Family communication :none Consults : nephrology CODE STATUS: full DVT Prophylaxis : heparin Level of care: Med-Surg Status is: Inpatient Remains inpatient appropriate because: AKI on CKD and amputation stomach infection  TOC fro d/c planning--per pt he does not want to return back to his previous facility Pt now wants to think about AKA as recommended by Vascular surgery    TOTAL TIME TAKING CARE OF THIS PATIENT: 35 minutes.  >50% time spent on counselling and coordination of care  Note: This dictation was prepared with Dragon dictation along with smaller phrase technology. Any transcriptional  errors that result from this process are unintentional.  Enedina Finner M.D    [facility]   CC: Primary care physician; [name], MD

## 2023-08-24 NOTE — [place] FL2 (Signed)
[place] MEDICAID FL2 LEVEL OF CARE FORM     IDENTIFICATION  Patient Name: Allen Chavez Birthdate: 11-11-1955 Sex: male Admission Date (Current Location): 08/18/2023  [place] and [place] Number:  Chiropodist and Address:  [facility], [address], [place], [place] [address]      Provider Number: [id number]  Attending Physician Name and Address:  Enedina Finner, MD  Relative Name and Phone Number:  Maisie Fus (Daughter)  [phone] (Mobile)    Current Level of Care: Hospital Recommended Level of Care: Skilled Nursing Facility Prior Approval Number:    Date Approved/Denied:   PASRR Number: Maisie Fus (Daughter)  [phone] (Mobile)  Discharge Plan: SNF    Current Diagnoses: Patient Active Problem List   Diagnosis Date Noted   Acute blood loss as cause of postoperative anemia 06/05/2023   Elevated TSH 05/29/2023   Normocytic anemia 05/29/2023   Severe sepsis (HCC) 05/28/2023   Peripheral arterial disease (HCC) 05/28/2023   Hyperlipidemia 05/28/2023   AKI (acute kidney injury) (HCC) 05/28/2023   GERD (gastroesophageal reflux disease) 05/28/2023   Gangrene of right foot (HCC) 05/28/2023   Necrotizing subcutaneous infection (HCC) 05/28/2023   Malignant neoplasm of prostate (HCC) 08/01/2014   Essential (primary) hypertension 08/01/2014   Type 2 diabetes mellitus (HCC) 08/01/2014    Orientation RESPIRATION BLADDER Height & Weight     Self, Situation, Place  Normal Indwelling catheter Weight: 66.3 kg Height:  6\' 2"  (188 cm)  BEHAVIORAL SYMPTOMS/MOOD NEUROLOGICAL BOWEL NUTRITION STATUS      Continent Diet  AMBULATORY STATUS COMMUNICATION OF NEEDS Skin   Extensive Assist Verbally Surgical wounds, Other (Comment) (BKA wound dehisence)                       Personal Care Assistance Level of Assistance  Dressing, Bathing Bathing Assistance: Maximum assistance   Dressing Assistance: Limited assistance     Functional  Limitations Info             SPECIAL CARE FACTORS FREQUENCY  PT (By licensed PT), OT (By licensed OT)     PT Frequency: 5x/week OT Frequency: 5x/week            Contractures Contractures Info: Not present    Additional Factors Info  Code Status, Allergies Code Status Info: Full Code Allergies Info: No Known allergies as of today 07/23/24.           Current Medications (08/24/2023):  This is the current hospital active medication list Current Facility-Administered Medications  Medication Dose Route Frequency Provider Last Rate Last Admin   aspirin EC tablet 81 mg  81 mg Oral Daily Darlin Priestly, MD   81 mg at 08/24/23 7253   Chlorhexidine Gluconate Cloth 2 % PADS 6 each  6 each Topical Daily Enedina Finner, MD   6 each at 08/24/23 0835   clopidogrel (PLAVIX) tablet 75 mg  75 mg Oral Daily Darlin Priestly, MD   75 mg at 08/24/23 0834   doxycycline (VIBRA-TABS) tablet 100 mg  100 mg Oral Q12H Enedina Finner, MD   100 mg at 08/24/23 0834   feeding supplement (NEPRO CARB STEADY) liquid 237 mL  237 mL Oral TID BM Darlin Priestly, MD   237 mL at 08/24/23 1240   heparin injection 5,000 Units  5,000 Units Subcutaneous Q8H Darlin Priestly, MD   5,000 Units at 08/24/23 0843   insulin aspart (novoLOG) injection 0-9 Units  0-9 Units Subcutaneous TID WC Darlin Priestly, MD   2  Units at 08/24/23 1240   insulin glargine-yfgn (SEMGLEE) injection 20 Units  20 Units Subcutaneous QHS Enedina Finner, MD       leptospermum manuka honey (MEDIHONEY) paste 1 Application  1 Application Topical Daily Enedina Finner, MD   1 Application at 08/24/23 0836   levothyroxine (SYNTHROID) tablet 25 mcg  25 mcg Oral Q0600 Darlin Priestly, MD   25 mcg at 08/24/23 0502   liver oil-zinc oxide (DESITIN) 40 % ointment   Topical BID Enedina Finner, MD   Given at 08/24/23 0835   meropenem (MERREM) 1 g in sodium chloride 0.9 % 100 mL IVPB  1 g Intravenous Q12H Hallaji, Sheema M, RPH       oxyCODONE (Oxy IR/ROXICODONE) immediate release tablet 5 mg  5 mg Oral Q8H PRN  Darlin Priestly, MD   5 mg at 08/23/23 1027   pantoprazole (PROTONIX) EC tablet 40 mg  40 mg Oral Daily Enedina Finner, MD   40 mg at 08/24/23 1610   senna-docusate (Senokot-S) tablet 1 tablet  1 tablet Oral BID Enedina Finner, MD   1 tablet at 08/24/23 1239   tamsulosin (FLOMAX) capsule 0.4 mg  0.4 mg Oral Daily Enedina Finner, MD   0.4 mg at 08/24/23 9604     Discharge Medications: Please see discharge summary for a list of discharge medications.  Relevant Imaging Results:  Relevant Lab Results:   Additional Information SS# [id number]  Bing Quarry, RN

## 2023-08-25 DIAGNOSIS — N179 Acute kidney failure, unspecified: Secondary | ICD-10-CM | POA: Diagnosis not present

## 2023-08-25 LAB — GLUCOSE, CAPILLARY
Glucose-Capillary: 146 mg/dL — ABNORMAL HIGH (ref 70–99)
Glucose-Capillary: 147 mg/dL — ABNORMAL HIGH (ref 70–99)
Glucose-Capillary: 187 mg/dL — ABNORMAL HIGH (ref 70–99)
Glucose-Capillary: 232 mg/dL — ABNORMAL HIGH (ref 70–99)
Glucose-Capillary: 228 mg/dL — ABNORMAL HIGH (ref 70–99)

## 2023-08-25 NOTE — Progress Notes (Signed)
Occupational Therapy Treatment Patient Details Name: Allen Chavez MRN: [id number] DOB: May 25, 1956 Today's Date: 08/25/2023   History of present illness Pt is a 68 y.o. male with medical history significant of DM2, PAD s/p bilateral BKA, CKD 3a, HTN who presented from SNF with report of vomiting of coffee-ground material.   OT comments  [name] was seen for OT treatment on this date. Upon arrival to room pt in bed, agreeable to tx. Pt requires CGA for simulated BSC t/f. SETUP + SUPERVISION don prosthetic in sitting. Defers further mobility attempts citing pain, RN in to address. Pt making good progress toward goals, will continue to follow POC. Discharge recommendation remains appropriate.       If plan is discharge home, recommend the following:  A little help with walking and/or transfers;A little help with bathing/dressing/bathroom;Assistance with cooking/housework;Assist for transportation;Help with stairs or ramp for entrance;Direct supervision/assist for financial management;Supervision due to cognitive status;Direct supervision/assist for medications management   Equipment Recommendations  Wheelchair (measurements OT)    Recommendations for Other Services      Precautions / Restrictions Precautions Precautions: Fall Precaution Comments: bilat BKA Restrictions Weight Bearing Restrictions Per Provider Order: No       Mobility Bed Mobility Overal bed mobility: Needs Assistance Bed Mobility: Supine to Sit Rolling: Supervision              Transfers Overall transfer level: Needs assistance   Transfers: Bed to chair/wheelchair/BSC            Lateral/Scoot Transfers: Contact guard assist       Balance Overall balance assessment: Needs assistance Sitting-balance support: Bilateral upper extremity supported Sitting balance-Leahy Scale: Fair                                     ADL either performed or assessed with clinical judgement   ADL Overall  ADL's : Needs assistance/impaired                                       General ADL Comments: CGA for simulated BSC t/f. SETUP + SUPERVISION don prosthetic in sitting      Cognition Arousal: Alert Behavior During Therapy: Anxious Overall Cognitive Status: No family/caregiver present to determine baseline cognitive functioning                                          Exercises      Shoulder Instructions       General Comments      Pertinent Vitals/ Pain       Pain Assessment Pain Assessment: Faces Faces Pain Scale: Hurts even more Pain Location: penis Pain Descriptors / Indicators: Sore, Grimacing Pain Intervention(s): Limited activity within patient's tolerance, Repositioned, Patient requesting pain meds-RN notified  Home Living                                          Prior Functioning/Environment              Frequency  Min 1X/week        Progress Toward Goals  OT Goals(current goals can now be found in  the care plan section)  Progress towards OT goals: Progressing toward goals  Acute Rehab OT Goals Patient Stated Goal: to not go to [facility] OT Goal Formulation: With patient Time For Goal Achievement: 09/04/23 Potential to Achieve Goals: Good ADL Goals Pt Will Perform Grooming: with set-up;sitting Pt Will Perform Upper Body Bathing: with set-up;sitting Pt Will Perform Lower Body Bathing: with contact guard assist;sitting/lateral leans Pt Will Perform Lower Body Dressing: with contact guard assist;sitting/lateral leans Pt Will Transfer to Toilet: with supervision;with transfer board;with contact guard assist Pt Will Perform Toileting - Clothing Manipulation and hygiene: with contact guard assist;sitting/lateral leans  Plan      Co-evaluation                 AM-PAC OT "6 Clicks" Daily Activity     Outcome Measure   Help from another person eating meals?: None Help from another person taking  care of personal grooming?: A Little Help from another person toileting, which includes using toliet, bedpan, or urinal?: A Lot Help from another person bathing (including washing, rinsing, drying)?: A Little Help from another person to put on and taking off regular upper body clothing?: A Little Help from another person to put on and taking off regular lower body clothing?: A Lot 6 Click Score: 17    End of Session    OT Visit Diagnosis: Other abnormalities of gait and mobility (R26.89);Muscle weakness (generalized) (M62.81)   Activity Tolerance Patient tolerated treatment well   Patient Left in chair;with call bell/phone within reach;with chair alarm set;with nursing/sitter in room   Nurse Communication          Time: 1610-9604 OT Time Calculation (min): 23 min  Charges: OT General Charges $OT Visit: 1 Visit OT Treatments $Self Care/Home Management : 8-22 mins  Kathie Dike, M.S. OTR/L  08/25/23, 12:21 PM  ascom [phone]

## 2023-08-25 NOTE — Progress Notes (Signed)
Patient resting in bed. IV alarm disarmed and IV disconnected. No other needs identified.

## 2023-08-25 NOTE — Progress Notes (Signed)
Physical Therapy Treatment Patient Details Name: Allen Chavez MRN: [id number] DOB: [birth year] Today's Date: 08/25/2023   History of Present Illness Pt is a 68 y.o. male with medical history significant of DM2, PAD s/p bilateral BKA, CKD 3a, HTN who presented from SNF with report of vomiting of coffee-ground material.    PT Comments  Pt alert, perseverating on discharge plans throughout mobility. He did display improved mobility today; able to come to EOB with supervision, definite use of bed rails. Good sitting balance and able to don L prosthetic with supervision (some assistance to don sleeve and then sock appropriately). Pt able to laterally scoot to recliner with CGA, extra time. Effortful and needed extended to perform without assist but able to do so. Pt with RN and CNA at bedside at end of session. The patient would benefit from further skilled PT intervention to continue to progress towards goals.      If plan is discharge home, recommend the following: A lot of help with walking and/or transfers;A lot of help with bathing/dressing/bathroom;Assist for transportation;Direct supervision/assist for financial management;Assistance with cooking/housework;Help with stairs or ramp for entrance   Can travel by private vehicle     No  Equipment Recommendations  Other (comment) (TBD)    Recommendations for Other Services       Precautions / Restrictions Precautions Precautions: Fall Precaution Comments: bilat BKA Restrictions Weight Bearing Restrictions Per Provider Order: No     Mobility  Bed Mobility Overal bed mobility: Needs Assistance Bed Mobility: Supine to Sit Rolling: Supervision              Transfers Overall transfer level: Needs assistance   Transfers: Bed to chair/wheelchair/BSC            Lateral/Scoot Transfers: Contact guard assist General transfer comment: L BKA prosthetic donned, effortful for pt to complete transfer but able to do so without physical  assistance    Ambulation/Gait               General Gait Details: pt declined further mobility   Stairs             Wheelchair Mobility     Tilt Bed    Modified Rankin (Stroke Patients Only)       Balance Overall balance assessment: Needs assistance Sitting-balance support: Bilateral upper extremity supported (L BKA prosthetic supported) Sitting balance-Leahy Scale: Good                                      Cognition Arousal: Alert Behavior During Therapy: Anxious Overall Cognitive Status: No family/caregiver present to determine baseline cognitive functioning                                 General Comments: pt oriented to self, place, but did display potential situational confusion        Exercises      General Comments        Pertinent Vitals/Pain Pain Assessment Pain Assessment: Faces Faces Pain Scale: Hurts little more Pain Location: did not specify Pain Descriptors / Indicators: Sore, Grimacing Pain Intervention(s): Limited activity within patient's tolerance, Repositioned    Home Living                          Prior Function  PT Goals (current goals can now be found in the care plan section) Progress towards PT goals: Progressing toward goals    Frequency    Min 1X/week      PT Plan      Co-evaluation              AM-PAC PT "6 Clicks" Mobility   Outcome Measure  Help needed turning from your back to your side while in a flat bed without using bedrails?: A Little Help needed moving from lying on your back to sitting on the side of a flat bed without using bedrails?: A Little Help needed moving to and from a bed to a chair (including a wheelchair)?: A Little Help needed standing up from a chair using your arms (e.g., wheelchair or bedside chair)?: A Lot Help needed to walk in hospital room?: Total Help needed climbing 3-5 steps with a railing? : Total 6 Click  Score: 13    End of Session   Activity Tolerance: Patient tolerated treatment well Patient left: in chair;with call bell/phone within reach;with chair alarm set Nurse Communication: Mobility status PT Visit Diagnosis: Difficulty in walking, not elsewhere classified (R26.2);Muscle weakness (generalized) (M62.81)     Time: 2130-8657 PT Time Calculation (min) (ACUTE ONLY): 19 min  Charges:    $Therapeutic Activity: 8-22 mins PT General Charges $$ ACUTE PT VISIT: 1 Visit                     Olga Coaster PT, DPT 10:45 AM,08/25/23

## 2023-08-25 NOTE — Progress Notes (Signed)
Progress Note    08/25/2023 8:56 AM * No surgery found *  Subjective:  Allen Chavez is a 68 yo male who presented to [facility] emergency room with complaint of feeling bad and throwing up blood. Patient underwent BKA on 06/04/23 and was taken back to the OR on 08/06/23 for wound debridement. Upon arrival this time he was noted to have multiple wounds to his left BKA. He has noted scabbing and skin breakdown along the incision line and behind his right knee.   On exam today these areas appear to be healing well on his current antibiotics. Patient is being followed by wound care with recommendations for dressing changes.    Vitals:   08/25/23 0645 08/25/23 0855  BP: (!) 152/80 135/64  Pulse: 74 80  Resp: 16 17  Temp: 98.7 F (37.1 C) 98.5 F (36.9 C)  SpO2: 100% 100%   Physical Exam: Cardiac:  RRR, no rubs clicks or gallops.  No murmurs appreciated Lungs: Lungs clear on auscultation throughout, normal rales rhonchi or wheezing.  Normal respiratory effort Incisions: Right BKA postoperative incision with scabbing areas and ulcerations.  Healing well on current 6. Extremities: Bilateral BKA's.  Legs warm to touch. Abdomen: Positive bowel sounds throughout, soft, nontender and distended. Neurologic: Alert and oriented x 3, patient appears to be agitated with his care and endorses he is not happy about returning to the facility he came from.  CBC    Component Value Date/Time   WBC 4.9 08/24/2023 1019   RBC 2.61 (L) 08/24/2023 1019   HGB 7.3 (L) 08/24/2023 1019   HGB 11.5 (L) 11/15/2014 0508   HCT 22.6 (L) 08/24/2023 1019   HCT 34.5 (L) 11/15/2014 0508   PLT 158 08/24/2023 1019   PLT 173 11/15/2014 0508   MCV 86.6 08/24/2023 1019   MCV 87 11/15/2014 0508   MCH 28.0 08/24/2023 1019   MCHC 32.3 08/24/2023 1019   RDW 15.7 (H) 08/24/2023 1019   RDW 14.0 11/15/2014 0508   LYMPHSABS 1.4 08/18/2023 1324   LYMPHSABS 1.4 11/15/2014 0508   MONOABS 0.5 08/18/2023 1324   MONOABS 0.6  11/15/2014 0508   EOSABS 0.0 08/18/2023 1324   EOSABS 0.0 11/15/2014 0508   BASOSABS 0.1 08/18/2023 1324   BASOSABS 0.1 11/15/2014 0508    BMET    Component Value Date/Time   NA 136 08/24/2023 0757   NA 135 11/15/2014 0508   K 4.3 08/24/2023 0757   K 4.1 11/15/2014 0508   CL 107 08/24/2023 0757   CL 108 11/15/2014 0508   CO2 24 08/24/2023 0757   CO2 26 11/15/2014 0508   GLUCOSE 167 (H) 08/24/2023 0757   GLUCOSE 167 (H) 11/15/2014 0508   BUN 40 (H) 08/24/2023 0757   BUN 26 (H) 11/15/2014 0508   CREATININE 1.99 (H) 08/24/2023 0757   CREATININE 0.91 11/15/2014 0508   CALCIUM 8.3 (L) 08/24/2023 0757   CALCIUM 8.0 (L) 11/15/2014 0508   GFRNONAA 36 (L) 08/24/2023 0757   GFRNONAA >60 11/15/2014 0508   GFRAA >60 11/15/2014 0508    INR    Component Value Date/Time   INR 1.5 (H) 08/18/2023 1324   INR 0.9 11/07/2014 0927     Intake/Output Summary (Last 24 hours) at 08/25/2023 0856 Last data filed at 08/25/2023 0600 Gross per 24 hour  Intake 575.54 ml  Output 1550 ml  Net -974.46 ml     Assessment/Plan:  68 y.o. male is s/p right BKA on 06/04/2023 and wound washout on 08/06/2023* No  surgery found *   PLAN Continue antibiotics as prescribed.  Patient's wounds are healing well at this point in time. Continue wound care's recommendations for wound care and dressing changes. Okay for vascular surgery for patient to be discharged back to skilled nursing facility. Patient to continue on aspirin 81 mg daily and Plavix 75 mg daily for dual antiplatelet therapy Patient to follow-up with vein and vascular in 1 month  DVT prophylaxis: ASA 81 mg daily and Plavix 75 mg daily.   Marcie [facility] 08/25/2023 8:56 AM

## 2023-08-25 NOTE — Progress Notes (Signed)
[facility]  - [place] [facility]   PATIENT NAME: Allen Chavez    MR#:  [id number]  DATE OF BIRTH:  [birth year]  SUBJECTIVE:  No family at bedside.  Patient is somewhat annoyed/irritated with his discharge planning. Discussed at length regarding only options he has. Saw vascular PA earlier. No plans for further vascular surgery at present.  VITALS:  Blood pressure 135/64, pulse 80, temperature 98.5 F (36.9 C), resp. rate 17, height 6\' 2"  (1.88 m), weight 66.3 kg, SpO2 100%.  PHYSICAL EXAMINATION:   GENERAL:  68 y.o.-year-old patient with no acute distress.  LUNGS: Normal breath sounds bilaterally, no wheezing CARDIOVASCULAR: S1, S2 normal. No murmur   ABDOMEN: Soft, nontender, nondistended. Bowel sounds present. FOLEY+ EXTREMITIES: bilateral BKA with right BKA dressing present NEUROLOGIC: nonfocal  patient is alert and awake SKIN:  per RN  LABORATORY PANEL:  CBC Recent Labs  Lab 08/24/23 1019  WBC 4.9  HGB 7.3*  HCT 22.6*  PLT 158    Chemistries  Recent Labs  Lab 08/23/23 0306 08/24/23 0757  NA  --  136  K  --  4.3  CL  --  107  CO2  --  24  GLUCOSE  --  167*  BUN  --  40*  CREATININE  --  1.99*  CALCIUM  --  8.3*  MG 1.6*  --    Cardiac Enzymes No results for input(s): "TROPONINI" in the last 168 hours. RADIOLOGY:  No results found.   Assessment and Plan  Allen Chavez is a 68 y.o. male with medical history significant of DM2, PAD s/p bilateral BKA, CKD 3a, HTN who presented from SNF with report of vomiting of coffee-ground material.      AKI on CKD 3b Anemia     of chronic disease --Cr 6.37 on presentation, was 2.21 on 08/04/22, with baseline around 1.3.  AKI presumed due to dehydration. --s/p 1.5L bolus in the ED f/b MIVF --nephrology consulted --Cr improving down to 1.99 (baseline) --c MIVF@75  as Na bicarb gtt--d/c now  Acute urinary retention--incomplete emptying E coli UTI--ESBL --in and out --flomax --d/w RPH to see if abx  can be changed to oral   Hyperkalemia --potassium 6.6 on presentation, 2/2 AKI.  Received temporizing agents in the ED. 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  ID pharm rec ceftazidime and Linezolid based on prior I/D culture results--change to po  --wound RN consult--cont aggressive wound care -- spoke with vascular NP. They will remove patient sutures tomorrow. According to them if no improvement with minimum two weeks of antibiotics they would recommend AKA Patient in the past has refused. --1/26--pt now considering AKA and wants to wait till tomorrow --1/27-- evaluated by vascular PA. At present no indication for a.k.a. Patient is at high risk for it though. He understands. Continue aggressive wound dressing.   PAD Remote hx of left BKA  Recent hx of right BKA --cont ASA --resume home plavix, statin (once done with linezolid)   Coffee-ground emesis  --reportedly had coffee-ground emesis at SNF.  Hgb 9.2, which is similar to recent baseline. --PPI BID  DM2 with CKD 3b --pt now on Semglee. W/f hypoglycemia --ACHS and SSI for now   Hypotension --stable now   HTN, not currently active --hold home Lisinopril   Dysphagia --stable   Procedures: Family communication :none  Consults : nephrology CODE STATUS: full DVT Prophylaxis : heparin Level of care: Med-Surg Status is: Inpatient Remains inpatient appropriate because: AKI on CKD and amputation stomach infection  TOC for discharge planning. Patient is agreeable to go to [facility]. He understands no other facility has offered beds. He does not want to extend search outside of the county.    TOTAL TIME TAKING CARE OF THIS PATIENT: 35 minutes.  >50% time spent on counselling and coordination of care  Note: This dictation was prepared with Dragon dictation  along with smaller phrase technology. Any transcriptional errors that result from this process are unintentional.  Enedina Finner M.D    [facility]   CC: Primary care physician; [name], MD

## 2023-08-25 NOTE — Progress Notes (Signed)
   08/25/23 1715  Spiritual Encounters  Type of Visit Initial  Care provided to: Patient;Friend  Conversation partners present during encounter Nurse  Referral source Nurse (RN/NT/LPN)  Reason for visit Advance directives  OnCall Visit Yes  Spiritual Framework  Presenting Themes Significant life change  Community/Connection Friend(s)  Interventions  Spiritual Care Interventions Made Established relationship of care and support;Compassionate presence;Reflective listening;Prayer;Encouragement  Intervention Outcomes  Outcomes Connection to spiritual care;Awareness around self/spiritual resourses  Spiritual Care Plan  Spiritual Care Issues Still Outstanding Referring to oncoming chaplain for further support   Requested advance directives. No notary available.

## 2023-08-25 NOTE — Progress Notes (Signed)
Dressing change performed per provider orders. Tolerated fair. Pain meds given. Resting in chair.

## 2023-08-25 NOTE — Plan of Care (Signed)
Problem: Coping: Goal: Ability to adjust to condition or change in health will improve Outcome: Progressing

## 2023-08-25 NOTE — Progress Notes (Signed)
Noted patients urine positive for ESBL on 08/21/23. Contact precautions ordered.

## 2023-08-26 DIAGNOSIS — N390 Urinary tract infection, site not specified: Secondary | ICD-10-CM

## 2023-08-26 DIAGNOSIS — E875 Hyperkalemia: Secondary | ICD-10-CM

## 2023-08-26 LAB — GLUCOSE, CAPILLARY
Glucose-Capillary: 99 mg/dL (ref 70–99)
Glucose-Capillary: 180 mg/dL — ABNORMAL HIGH (ref 70–99)
Glucose-Capillary: 171 mg/dL — ABNORMAL HIGH (ref 70–99)
Glucose-Capillary: 199 mg/dL — ABNORMAL HIGH (ref 70–99)

## 2023-08-26 LAB — MRSA NEXT GEN BY PCR, NASAL: MRSA by PCR Next Gen: DETECTED — AB

## 2023-08-26 MED ORDER — MUPIROCIN 2 % EX OINT
1.0000 | TOPICAL_OINTMENT | Freq: Two times a day (BID) | CUTANEOUS | Status: DC
  Administered 2023-08-26 – 2023-08-28 (×4): 1 via NASAL
  Filled 2023-08-26: qty 22

## 2023-08-26 MED ORDER — FENOFIBRATE 160 MG PO TABS
160.0000 mg | ORAL_TABLET | Freq: Every day | ORAL | Status: DC
  Administered 2023-08-26 – 2023-08-28 (×3): 160 mg via ORAL
  Filled 2023-08-26 (×3): qty 1

## 2023-08-26 MED ORDER — FOSFOMYCIN TROMETHAMINE 3 G PO PACK
3.0000 g | PACK | Freq: Once | ORAL | Status: AC
  Administered 2023-08-26: 3 g via ORAL
  Filled 2023-08-26: qty 3

## 2023-08-26 MED ORDER — ATORVASTATIN CALCIUM 20 MG PO TABS
40.0000 mg | ORAL_TABLET | Freq: Every evening | ORAL | Status: DC
  Administered 2023-08-26 – 2023-08-27 (×2): 40 mg via ORAL
  Filled 2023-08-26 (×2): qty 2

## 2023-08-26 MED ORDER — CIPROFLOXACIN HCL 500 MG PO TABS
500.0000 mg | ORAL_TABLET | Freq: Two times a day (BID) | ORAL | Status: DC
  Administered 2023-08-26 – 2023-08-28 (×5): 500 mg via ORAL
  Filled 2023-08-26 (×5): qty 1

## 2023-08-26 MED ORDER — RISAQUAD PO CAPS
1.0000 | ORAL_CAPSULE | Freq: Every day | ORAL | Status: DC
  Administered 2023-08-26 – 2023-08-28 (×3): 1 via ORAL
  Filled 2023-08-26 (×3): qty 1

## 2023-08-26 MED ORDER — INSULIN GLARGINE-YFGN 100 UNIT/ML ~~LOC~~ SOLN
24.0000 [IU] | Freq: Every day | SUBCUTANEOUS | Status: DC
  Administered 2023-08-26 – 2023-08-27 (×2): 24 [IU] via SUBCUTANEOUS
  Filled 2023-08-26 (×2): qty 0.24

## 2023-08-26 NOTE — TOC Transition Note (Signed)
Transition of Care [facility]) - Discharge Note   Patient Details  Name: Allen Chavez MRN: [id number] Date of Birth: August 17, 1955  Transition of Care [facility]) CM/SW Contact:  Garret Reddish, RN Phone Number: 08/26/2023, 9:38 AM   Clinical Narrative:    Chart reviewed. Bed search extended to entire hub.  Patient would like to explore other SNF bed options.    TOC will continue to follow for discharge planning.           Patient Goals and CMS Choice            Discharge Placement                       Discharge Plan and Services Additional resources added to the After Visit Summary for                                       Social Drivers of Health (SDOH) Interventions SDOH Screenings   Food Insecurity: No Food Insecurity (08/19/2023)  Housing: Low Risk  (08/19/2023)  Transportation Needs: No Transportation Needs (08/19/2023)  Utilities: Not At Risk (08/19/2023)  Financial Resource Strain: Patient Declined (09/30/2022)   Received from [facility], [facility]  Tobacco Use: High Risk (08/18/2023)     Readmission Risk Interventions    06/06/2023   11:28 AM  Readmission Risk Prevention Plan  Transportation Screening Complete  PCP or Specialist Appt within 3-5 Days Complete  Social Work Consult for Recovery Care Planning/Counseling Complete  Palliative Care Screening Not Applicable  Medication Review Oceanographer) Referral to Pharmacy

## 2023-08-26 NOTE — Progress Notes (Signed)
   08/26/23 1400  Spiritual Encounters  Type of Visit Initial  Care provided to: Pt and family  Referral source Chaplain team  Reason for visit Advance directives  OnCall Visit No  Spiritual Framework  Presenting Themes Meaning/purpose/sources of inspiration;Significant life change;Caregiving needs  Community/Connection Friend(s)  Interventions  Spiritual Care Interventions Made Compassionate presence;Reflective listening;Prayer;Encouragement  Intervention Outcomes  Outcomes Connection to spiritual care   2 Chaplains worked together on an [employer]chant [employer] officer. Patient was able to give consent. A notary was contacted and two witnesses were found. All documents were signed and sent and filed accordingly.

## 2023-08-26 NOTE — Plan of Care (Signed)
  Problem: Education: Goal: Ability to describe self-care measures that may prevent or decrease complications (Diabetes Survival Skills Education) will improve Outcome: Progressing Goal: Individualized Educational Video(s) Outcome: Progressing   Problem: Coping: Goal: Ability to adjust to condition or change in health will improve Outcome: Progressing   Problem: Metabolic: Goal: Ability to maintain appropriate glucose levels will improve Outcome: Progressing   Problem: Skin Integrity: Goal: Risk for impaired skin integrity will decrease Outcome: Progressing   Problem: Education: Goal: Knowledge of General Education information will improve Description: Including pain rating scale, medication(s)/side effects and non-pharmacologic comfort measures Outcome: Progressing   Problem: Clinical Measurements: Goal: Will remain free from infection Outcome: Progressing

## 2023-08-26 NOTE — Progress Notes (Addendum)
[facility]  - [place] [facility]   PATIENT NAME: Allen Chavez    MR#:  [id number]  DATE OF BIRTH:  1955-11-30  SUBJECTIVE:  No family at bedside.  Patient is somewhat annoyed/irritated with his discharge planning. Discussed at length regarding only options he has. Saw vascular PA earlier. No plans for further vascular surgery at present.  VITALS:  Blood pressure 124/63, pulse 66, temperature 98.3 F (36.8 C), temperature source Oral, resp. rate 20, height 6\' 2"  (1.88 m), weight 66.3 kg, SpO2 99%.  PHYSICAL EXAMINATION:   GENERAL:  68 y.o.-year-old patient with no acute distress.  LUNGS: Normal breath sounds bilaterally, no wheezing CARDIOVASCULAR: S1, S2 normal. No murmur   ABDOMEN: Soft, nontender, nondistended. Bowel sounds present. FOLEY+ EXTREMITIES: bilateral BKA with right BKA dressing present NEUROLOGIC: nonfocal  patient is alert and awake SKIN:  per RN  LABORATORY PANEL:  CBC Recent Labs  Lab 08/24/23 1019  WBC 4.9  HGB 7.3*  HCT 22.6*  PLT 158    Chemistries  Recent Labs  Lab 08/23/23 0306 08/24/23 0757  NA  --  136  K  --  4.3  CL  --  107  CO2  --  24  GLUCOSE  --  167*  BUN  --  40*  CREATININE  --  1.99*  CALCIUM  --  8.3*  MG 1.6*  --     RADIOLOGY:  No results found.   Assessment and Plan  Allen Chavez is a 68 y.o. male with medical history significant of DM2, PAD s/p bilateral BKA, CKD 3a, HTN who presented from SNF with report of vomiting of coffee-ground material.      AKI on CKD 3b Anemia     of chronic disease --Cr 6.37 on presentation, was 2.21 on 08/04/22, with baseline around 1.3.  AKI presumed due to dehydration. --s/p 1.5L bolus in the ED f/b MIVF --nephrology consulted --Cr improving down to 1.99 (baseline) --c MIVF@75  as Na bicarb gtt--d/c now  Acute urinary retention--incomplete emptying E coli UTI--ESBL --in and out --flomax --d/w RPH to see if abx can be changed to oral -- pt finished the  course--got 1 dose of fosphomycin. Was on IV meropenem   Hyperkalemia --potassium 6.6 on presentation, 2/2 AKI.  Received temporizing agents in the ED. 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  ID pharm rec ceftazidime and Linezolid based on prior I/D culture results--change to po  --wound RN consult--cont aggressive wound care -- spoke with vascular NP. They will remove patient sutures tomorrow. According to them if no improvement with minimum two weeks of antibiotics they would recommend AKA Patient in the past has refused. --1/26--pt now considering AKA and wants to wait till tomorrow --1/27-- evaluated by vascular PA. At present no indication for a.k.a. Patient is at high risk for it though. He understands. Continue aggressive wound dressing. --changed to cipro +doxy (total 14 days)   PAD Remote hx of left BKA  Recent hx of right BKA --cont ASA --resume home plavix, statin    Coffee-ground emesis  --reportedly had coffee-ground emesis at SNF.  Hgb 9.2, which is similar to recent baseline. --PPI BID  DM2 with CKD 3b --pt now on Semglee. W/f hypoglycemia --ACHS and SSI for now     HTN, not currently active --hold home Lisinopril--bp ok   Dysphagia --  no active issues  Peer to peer done and pt is approved per [employer] medical director  Family communication :none Consults : nephrology CODE STATUS: full DVT Prophylaxis : heparin Level of care: Med-Surg Status is: Inpatient Remains inpatient appropriate because: awaiting rehab bed  TOC for discharge planning. Per TOC patient now wants to extend bed search out of [place]. He has bed offer at [facility] healthcare. Patient does not want to return there. If he has no out of company beds patient understands he will have no option but return to [facility].  Patient  is best at baseline for discharge.  TOTAL TIME TAKING CARE OF THIS PATIENT: 35 minutes.  >50% time spent on counselling and coordination of care  Note: This dictation was prepared with Dragon dictation along with smaller phrase technology. Any transcriptional errors that result from this process are unintentional.  Enedina Finner M.D    [facility]   CC: Primary care physician; [name], MD

## 2023-08-26 NOTE — TOC Progression Note (Signed)
Transition of Care [facility]) - Progression Note    Patient Details  Name: Allen Chavez MRN: [id number] Date of Birth: 12/21/1955  Transition of Care [facility]) CM/SW Contact  Garret Reddish, RN Phone Number: 08/26/2023, 9:11 AM  Clinical Narrative:    Chart reviewed.  SNF authorization still pending.  TOC will continue to follow for discharge planning.          Expected Discharge Plan and Services                                               Social Determinants of Health (SDOH) Interventions SDOH Screenings   Food Insecurity: No Food Insecurity (08/19/2023)  Housing: Low Risk  (08/19/2023)  Transportation Needs: No Transportation Needs (08/19/2023)  Utilities: Not At Risk (08/19/2023)  Financial Resource Strain: Patient Declined (09/30/2022)   Received from [facility], [facility]  Tobacco Use: High Risk (08/18/2023)    Readmission Risk Interventions    06/06/2023   11:28 AM  Readmission Risk Prevention Plan  Transportation Screening Complete  PCP or Specialist Appt within 3-5 Days Complete  Social Work Consult for Recovery Care Planning/Counseling Complete  Palliative Care Screening Not Applicable  Medication Review Oceanographer) Referral to Pharmacy

## 2023-08-26 NOTE — Plan of Care (Signed)

## 2023-08-26 NOTE — TOC Progression Note (Signed)
Transition of Care [facility]) - Progression Note    Patient Details  Name: Allen Chavez MRN: [id number] Date of Birth: 1956/06/03  Transition of Care [facility]) CM/SW Contact  Garret Reddish, RN Phone Number: 08/26/2023, 9:08 AM  Clinical Narrative:    Late entry for 08-26-2023 Chart reviewed.  SNF authorization submitted on yesterday.  Pending SNF ID is [id number].    TOC will continue to follow for discharge planning.          Expected Discharge Plan and Services                                               Social Determinants of Health (SDOH) Interventions SDOH Screenings   Food Insecurity: No Food Insecurity (08/19/2023)  Housing: Low Risk  (08/19/2023)  Transportation Needs: No Transportation Needs (08/19/2023)  Utilities: Not At Risk (08/19/2023)  Financial Resource Strain: Patient Declined (09/30/2022)   Received from [facility], [facility]  Tobacco Use: High Risk (08/18/2023)    Readmission Risk Interventions    06/06/2023   11:28 AM  Readmission Risk Prevention Plan  Transportation Screening Complete  PCP or Specialist Appt within 3-5 Days Complete  Social Work Consult for Recovery Care Planning/Counseling Complete  Palliative Care Screening Not Applicable  Medication Review Oceanographer) Referral to Pharmacy

## 2023-08-26 NOTE — TOC Progression Note (Signed)
Transition of Care [facility]) - Progression Note    Patient Details  Name: Allen Chavez MRN: [id number] Date of Birth: 1956/03/02  Transition of Care [facility]) CM/SW Contact  Garret Reddish, RN Phone Number: 08/26/2023, 4:22 PM  Clinical Narrative:     Chart reviewed.  Noted I have received information on a Peer Peer.  Peer to Peer information is enclosed below:  Eber Jones from [employer] called requesting Peer to Peer. Deadline for Peer to Peer is 1.29.25 at 10:30am. The number is [phone] option 5. They will need pts name, DOB and member ID [id number].  I have made Dr. Allena Katz aware.         Expected Discharge Plan and Services                                               Social Determinants of Health (SDOH) Interventions SDOH Screenings   Food Insecurity: No Food Insecurity (08/19/2023)  Housing: Low Risk  (08/19/2023)  Transportation Needs: No Transportation Needs (08/19/2023)  Utilities: Not At Risk (08/19/2023)  Financial Resource Strain: Patient Declined (09/30/2022)   Received from [facility], [facility]  Tobacco Use: High Risk (08/18/2023)    Readmission Risk Interventions    06/06/2023   11:28 AM  Readmission Risk Prevention Plan  Transportation Screening Complete  PCP or Specialist Appt within 3-5 Days Complete  Social Work Consult for Recovery Care Planning/Counseling Complete  Palliative Care Screening Not Applicable  Medication Review Oceanographer) Referral to Pharmacy

## 2023-08-26 NOTE — TOC Progression Note (Signed)
Transition of Care [facility]) - Progression Note    Patient Details  Name: Allen Chavez MRN: [id number] Date of Birth: 24-Jul-1956  Transition of Care [facility]) CM/SW Contact  Garret Reddish, RN Phone Number: 08/26/2023, 4:26 PM  Clinical Narrative:    Dr. Allena Katz informs me that Peer to Peer has been completed and patient has been approved for SNF.          Expected Discharge Plan and Services                                               Social Determinants of Health (SDOH) Interventions SDOH Screenings   Food Insecurity: No Food Insecurity (08/19/2023)  Housing: Low Risk  (08/19/2023)  Transportation Needs: No Transportation Needs (08/19/2023)  Utilities: Not At Risk (08/19/2023)  Financial Resource Strain: Patient Declined (09/30/2022)   Received from [facility], [facility]  Tobacco Use: High Risk (08/18/2023)    Readmission Risk Interventions    06/06/2023   11:28 AM  Readmission Risk Prevention Plan  Transportation Screening Complete  PCP or Specialist Appt within 3-5 Days Complete  Social Work Consult for Recovery Care Planning/Counseling Complete  Palliative Care Screening Not Applicable  Medication Review Oceanographer) Referral to Pharmacy

## 2023-08-26 NOTE — TOC Progression Note (Signed)
Transition of Care [facility]) - Progression Note    Patient Details  Name: Allen Chavez MRN: [id number] Date of Birth: 1955/09/01  Transition of Care [facility]) CM/SW Contact  Garret Reddish, RN Phone Number: 08/26/2023, 4:27 PM  Clinical Narrative:     I have spoken with Fredia Sorrow. Kami informs me that she and her husband came in today and have completed HCPOA paperwork.  Kami reports that patient does not want to return back to [facility].  I have received current bed offer with Kami and patient and Kami have chosen [facility].  I have made [place] with [facility] a message.  I will also need to change SNF authorization to [facility].    Kami informs me that patient has been in a facility since [place] of last year.  She and her husband are assist with his care.    Kami's contact number is [phone].   TOC will continue to follow for discharge planning.         Expected Discharge Plan and Services                                               Social Determinants of Health (SDOH) Interventions SDOH Screenings   Food Insecurity: No Food Insecurity (08/19/2023)  Housing: Low Risk  (08/19/2023)  Transportation Needs: No Transportation Needs (08/19/2023)  Utilities: Not At Risk (08/19/2023)  Financial Resource Strain: Patient Declined (09/30/2022)   Received [facility], [facility]  Tobacco Use: High Risk (08/18/2023)    Readmission Risk Interventions    06/06/2023   11:28 AM  Readmission Risk Prevention Plan  Transportation Screening Complete  PCP or Specialist Appt within 3-5 Days Complete  Social Work Consult for Recovery Care Planning/Counseling Complete  Palliative Care Screening Not Applicable  Medication Review Oceanographer) Referral to Pharmacy

## 2023-08-27 DIAGNOSIS — N179 Acute kidney failure, unspecified: Secondary | ICD-10-CM | POA: Diagnosis not present

## 2023-08-27 LAB — GLUCOSE, CAPILLARY
Glucose-Capillary: 115 mg/dL — ABNORMAL HIGH (ref 70–99)
Glucose-Capillary: 98 mg/dL (ref 70–99)
Glucose-Capillary: 132 mg/dL — ABNORMAL HIGH (ref 70–99)
Glucose-Capillary: 175 mg/dL — ABNORMAL HIGH (ref 70–99)

## 2023-08-27 NOTE — Plan of Care (Signed)
  Problem: Education: Goal: Ability to describe self-care measures that may prevent or decrease complications (Diabetes Survival Skills Education) will improve Outcome: Progressing Goal: Individualized Educational Video(s) Outcome: Progressing   Problem: Coping: Goal: Ability to adjust to condition or change in health will improve Outcome: Progressing   Problem: Fluid Volume: Goal: Ability to maintain a balanced intake and output will improve Outcome: Progressing   Problem: Skin Integrity: Goal: Risk for impaired skin integrity will decrease Outcome: Progressing   Problem: Activity: Goal: Risk for activity intolerance will decrease Outcome: Progressing

## 2023-08-27 NOTE — Plan of Care (Signed)

## 2023-08-27 NOTE — Progress Notes (Signed)
[facility]  - [place] at [facility]   PATIENT NAME: Allen Chavez    MR#:  [id number]  DATE OF BIRTH:  04-11-1956  SUBJECTIVE:  Resting in bed, reports stable right stump pain, tolerating diet, had bm today  VITALS:  Blood pressure (!) 141/69, pulse 69, temperature 97.7 F (36.5 C), resp. rate 20, height 6\' 2"  (1.88 m), weight 66.3 kg, SpO2 99%.  PHYSICAL EXAMINATION:   GENERAL:  68 y.o.-year-old patient with no acute distress.  LUNGS: Normal breath sounds bilaterally, no wheezing CARDIOVASCULAR: S1, S2 normal. No murmur   ABDOMEN: Soft, nontender, nondistended.  OLEY+ EXTREMITIES: bilateral BKA with right BKA dressing present NEUROLOGIC: nonfocal  patient is alert and awake SKIN:  no visible rashes or lesions  LABORATORY PANEL:  CBC Recent Labs  Lab 08/24/23 1019  WBC 4.9  HGB 7.3*  HCT 22.6*  PLT 158    Chemistries  Recent Labs  Lab 08/23/23 0306 08/24/23 0757  NA  --  136  K  --  4.3  CL  --  107  CO2  --  24  GLUCOSE  --  167*  BUN  --  40*  CREATININE  --  1.99*  CALCIUM  --  8.3*  MG 1.6*  --     RADIOLOGY:  No results found.   Assessment and Plan  Allen Chavez is a 68 y.o. male with medical history significant of DM2, PAD s/p bilateral BKA, CKD 3a, HTN who presented from SNF with report of vomiting of coffee-ground material.      AKI on CKD 3b Anemia     of chronic disease --Cr 6.37 on presentation, was 2.21 on 08/04/22, with baseline around 1.3.  AKI presumed due to dehydration. --s/p 1.5L bolus in the ED f/b MIVF --nephrology consulted --c MIVF@75  as Na bicarb gtt--d/c now - will order check of kidney function tomorrow  Acute urinary retention--incomplete emptying E coli UTI--ESBL --in and out --flomax --d/w RPH to see if abx can be changed to oral -- pt finished the course--got 1 dose of fosphomycin. Was on IV meropenem   Hyperkalemia --potassium 6.6 on presentation, 2/2 AKI.  Received temporizing agents in the ED. 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  ID pharm rec ceftazidime and Linezolid based on prior I/D culture results--change to po  --wound RN consult--cont aggressive wound care -- spoke with vascular NP. They will remove patient sutures tomorrow. According to them if no improvement with minimum two weeks of antibiotics they would recommend AKA Patient in the past has refused. --1/26--pt now considering AKA and wants to wait till tomorrow --1/27-- evaluated by vascular PA. At present no indication for a.k.a. Patient is at high risk for it though. He understands. Continue aggressive wound dressing. --changed to cipro +doxy (total 14 days)   PAD Remote hx of left BKA  Recent hx of right BKA --cont ASA --resume home plavix, statin    Coffee-ground emesis  --reportedly had coffee-ground emesis at SNF.  Hgb 9.2, which is similar to recent baseline. --PPI BID  DM2 with CKD 3b --pt now on Semglee. W/f hypoglycemia --ACHS and SSI for now     HTN, not currently active --hold home Lisinopril--bp ok   Dysphagia --no active issues  Peer to peer done and pt is approved per [employer] medical director  Family  communication :none Consults : nephrology, vascular surgery CODE STATUS: full DVT Prophylaxis : heparin Level of care: Med-Surg Status is: Inpatient Remains inpatient appropriate because: awaiting rehab bed  TOC for discharge planning. Per TOC patient now wants to extend bed search out of [place]. He has bed [facility] at [facility]. Patient does not want to return there. If he has no out of company beds patient understands he will have no option but return to [facility].   Silvano Bilis M.D    [facility]   CC: Primary care physician; [name], MD

## 2023-08-28 DIAGNOSIS — N179 Acute kidney failure, unspecified: Secondary | ICD-10-CM | POA: Diagnosis not present

## 2023-08-28 LAB — CBC
HCT: 23.3 % — ABNORMAL LOW (ref 39.0–52.0)
Hemoglobin: 7.6 g/dL — ABNORMAL LOW (ref 13.0–17.0)
MCH: 27.2 pg (ref 26.0–34.0)
MCHC: 32.6 g/dL (ref 30.0–36.0)
MCV: 83.5 fL (ref 80.0–100.0)
Platelets: 165 10*3/uL (ref 150–400)
RBC: 2.79 MIL/uL — ABNORMAL LOW (ref 4.22–5.81)
RDW: 15.6 % — ABNORMAL HIGH (ref 11.5–15.5)
WBC: 5.5 10*3/uL (ref 4.0–10.5)
nRBC: 0 % (ref 0.0–0.2)

## 2023-08-28 LAB — GLUCOSE, CAPILLARY
Glucose-Capillary: 53 mg/dL — ABNORMAL LOW (ref 70–99)
Glucose-Capillary: 114 mg/dL — ABNORMAL HIGH (ref 70–99)
Glucose-Capillary: 165 mg/dL — ABNORMAL HIGH (ref 70–99)

## 2023-08-28 LAB — BASIC METABOLIC PANEL
Anion gap: 7 (ref 5–15)
BUN: 47 mg/dL — ABNORMAL HIGH (ref 8–23)
CO2: 24 mmol/L (ref 22–32)
Calcium: 8.8 mg/dL — ABNORMAL LOW (ref 8.9–10.3)
Chloride: 106 mmol/L (ref 98–111)
Creatinine, Ser: 1.67 mg/dL — ABNORMAL HIGH (ref 0.61–1.24)
GFR, Estimated: 44 mL/min — ABNORMAL LOW (ref >60)
Glucose, Bld: 73 mg/dL (ref 70–99)
Potassium: 4.2 mmol/L (ref 3.5–5.1)
Sodium: 137 mmol/L (ref 135–145)

## 2023-08-28 MED ORDER — CIPROFLOXACIN HCL 500 MG PO TABS: 500.0000 mg | ORAL_TABLET | Freq: Two times a day (BID) | ORAL | Status: AC

## 2023-08-28 MED ORDER — INSULIN GLARGINE 100 UNIT/ML SOLOSTAR PEN: 20.0000 [IU] | PEN_INJECTOR | Freq: Every day | SUBCUTANEOUS | Status: DC

## 2023-08-28 MED ORDER — INSULIN GLARGINE-YFGN 100 UNIT/ML ~~LOC~~ SOLN
20.0000 [IU] | Freq: Every day | SUBCUTANEOUS | Status: DC
  Filled 2023-08-28: qty 0.2

## 2023-08-28 MED ORDER — OXYCODONE HCL 5 MG PO TABS: 5.0000 mg | ORAL_TABLET | Freq: Four times a day (QID) | ORAL | 0 refills | Status: DC | PRN

## 2023-08-28 MED ORDER — PANTOPRAZOLE SODIUM 40 MG PO TBEC: 40.0000 mg | DELAYED_RELEASE_TABLET | Freq: Every day | ORAL | Status: DC

## 2023-08-28 MED ORDER — TAMSULOSIN HCL 0.4 MG PO CAPS: 0.4000 mg | ORAL_CAPSULE | Freq: Every day | ORAL | Status: DC

## 2023-08-28 MED ORDER — DOXYCYCLINE HYCLATE 100 MG PO TABS: 100.0000 mg | ORAL_TABLET | Freq: Two times a day (BID) | ORAL | Status: AC

## 2023-08-28 NOTE — TOC Transition Note (Signed)
Transition of Care [facility]) - Discharge Note   Patient Details  Name: Allen Chavez MRN: [id number] Date of Birth: 04/18/1956  Transition of Care [facility]) CM/SW Contact:  Garret Reddish, RN Phone Number: 08/28/2023, 12:49 PM   Clinical Narrative:    Chart reviewed.  Noted that patient has orders for discharge today.  I have spoken with [facility] and patient has been approved for SNF for [facility].  [facility] has changed SNF authorization from [facility] to [facility].  Revonda Standard, Admission Coordinator with [facility] reports that she will have a bed for patient today.  Revonda Standard reports that patient will go to room 502-B and the number to call report is [phone].  I have sent [facility] patient's Discharge Summary, Discharge orders, and SNF transfer Report via Epic hub.    I have informed patient's POA Kami that patient will discharge to [facility] Rehab today.    I have arranged for [facility] to transport patient to the facility today.    I have informed staff nurse of the above information.           Final next level of care: Skilled Nursing Facility Barriers to Discharge: No Barriers Identified   Patient Goals and CMS Choice   CMS Medicare.gov Compare Post Acute Care list provided to:: Patient Choice offered to / list presented to : Patient      Discharge Placement                Patient to be transferred to facility by: [facility] Name of family member notified: Kami ( patient's POA) Patient and family notified of of transfer: 08/28/23  Discharge Plan and Services Additional resources added to the After Visit Summary for                                       Social Drivers of Health (SDOH) Interventions SDOH Screenings   Food Insecurity: No Food Insecurity (08/19/2023)  Housing: Low Risk  (08/19/2023)  Transportation Needs: No Transportation Needs (08/19/2023)  Utilities: Not At Risk  (08/19/2023)  Financial Resource Strain: Patient Declined (09/30/2022)   Received from [facility], [facility]  Tobacco Use: High Risk (08/18/2023)     Readmission Risk Interventions    06/06/2023   11:28 AM  Readmission Risk Prevention Plan  Transportation Screening Complete  PCP or Specialist Appt within 3-5 Days Complete  Social Work Consult for Recovery Care Planning/Counseling Complete  Palliative Care Screening Not Applicable  Medication Review Oceanographer) Referral to Pharmacy

## 2023-08-28 NOTE — Discharge Summary (Signed)
Allen Chavez UJW:[id number] DOB: 1955-12-14 DOA: 08/18/2023  PCP: [name], MD  Admit date: 08/18/2023 Discharge date: 08/28/2023  Time spent: 35 minutes  Recommendations for Outpatient Follow-up:  Vascular surgery follow up in about one month (need to schedule) Urology follow up in about 2 weeks (need to schedule) PCP f/u   Check cbc and bmp in 1 week    Discharge Diagnoses:  Principal Problem:   AKI (acute kidney injury) (HCC)   Discharge Condition: stable  Diet recommendation: heart healthy carb modified  Filed Weights   08/18/23 1323  Weight: 66.3 kg    History of present illness:  Allen Chavez is a 68 y.o. male with medical history significant of DM2, PAD s/p bilateral BKA, CKD 3a, HTN who presented from SNF with report of vomiting of coffee-ground material.      Pt wasn't cooperative in answering questions, was sarcastic and refusing to answer stating that we have asked him that a dozen times.  Per ED triage note, pt "Vomited 1000 mL of coffee ground substance, been vomiting that way for 3 days denies eating for 3 days."     Of note, pt was discharged on 06/11/23 after being treated for Severe sepsis secondary to right foot gangrene s/p right BKA on 06/04/23.  Hospital Course:   AKI on CKD 3b Anemia     of chronic disease --Cr 6.37 on presentation, was 2.21 on 08/04/22, with baseline around 1.3.  AKI presumed due to dehydration. --s/p 1.5L bolus in the ED f/b MIVF --nephrology consulted, now signed off - check bmp in about 1 week   Acute urinary retention--incomplete emptying E coli UTI--ESBL --in and out --flomax --d/w RPH to see if abx can be changed to oral -- pt finished the course--got 1 dose of fosphomycin. Was on IV meropenem - will need outpatient urology f/u   Hyperkalemia --potassium 6.6 on presentation, 2/2 AKI.  Received temporizing agents in the ED. -- now resolved   Ulcerations over right stump site with cellulitis Hx of right foot gangrene  s/p right BKA on 06/04/23 --Vascular surgery requested to eval wounds --start abx per vascular surgery.  ID pharm rec ceftazidime and Linezolid based on prior I/D culture results--change to po  --wound RN consulted -- spoke with vascular NP.  - evaluted by vascular, healing is appropriate - will finish 14 days abx with cipro/doxy - f/u vascular one month  PAD Remote hx of left BKA  Recent hx of right BKA --cont ASA, statin, plavix   Coffee-ground emesis  --reportedly had coffee-ground emesis at SNF.  None here. Baseline hgb in 8s, here stable in 7s. Decision made to not pursue endoluminal eval - would check cbc in 1 week, if dropping or if patient develops clinical signs of gi bleed would pursue GI evaluation  DM2 with CKD 3b stable   HTN, not currently active --hold home Lisinopril for now given aki    Procedures: none   Consultations: Vascular surgery, nephrology  Discharge Exam: Vitals:   08/28/23 0403 08/28/23 0759  BP: 130/65 134/67  Pulse: 70 67  Resp: 18 18  Temp: 98.5 F (36.9 C)   SpO2: 100% 100%    GENERAL:  68 y.o.-year-old patient with no acute distress.  LUNGS: Normal breath sounds bilaterally, no wheezing CARDIOVASCULAR: S1, S2 normal. No murmur   ABDOMEN: Soft, nontender, nondistended.  Has foley EXTREMITIES: bilateral BKA with right BKA dressing present NEUROLOGIC: nonfocal  patient is alert and awake SKIN:  no visible rashes or lesions  Discharge Instructions   Discharge Instructions     Diet - low sodium heart healthy   Complete by: As directed    Discharge wound care:   Complete by: As directed    Cleanse wounds R posterior leg and medial and lateral stump with Vashe wound cleanser Hart Rochester [phone]), apply Medihoney to wound beds daily, cover with dry gauze. Cover with ABD pad and secure with Kerlix roll gauze.   Increase activity slowly   Complete by: As directed       Allergies as of 08/28/2023   No Known Allergies       Medication List     STOP taking these medications    lisinopril 20 MG tablet Commonly known as: ZESTRIL   omeprazole 20 MG capsule Commonly known as: PRILOSEC       TAKE these medications    acetaminophen 325 MG tablet Commonly known as: TYLENOL Take 2 tablets (650 mg total) by mouth every 6 (six) hours as needed for mild pain (pain score 1-3) (or Fever >/= 101).   aspirin EC 81 MG tablet Take 1 tablet (81 mg total) by mouth daily. Swallow whole.   atorvastatin 40 MG tablet Commonly known as: LIPITOR Take 40 mg by mouth every evening.   ciprofloxacin 750 MG tablet Commonly known as: CIPRO Take 750 mg by mouth 2 (two) times daily. 01/14-01/23 What changed: Another medication with the same name was added. Make sure you understand how and when to take each.   ciprofloxacin 500 MG tablet Commonly known as: CIPRO Take 1 tablet (500 mg total) by mouth 2 (two) times daily for 4 days. What changed: You were already taking a medication with the same name, and this prescription was added. Make sure you understand how and when to take each.   clopidogrel 75 MG tablet Commonly known as: PLAVIX Take 1 tablet (75 mg total) by mouth daily.   doxycycline 100 MG tablet Commonly known as: VIBRA-TABS Take 1 tablet (100 mg total) by mouth every 12 (twelve) hours for 4 days.   fenofibrate 160 MG tablet Take 1 tablet by mouth daily.   insulin glargine 100 UNIT/ML Solostar Pen Commonly known as: LANTUS Inject 20 Units into the skin daily. What changed: how much to take   levothyroxine 25 MCG tablet Commonly known as: SYNTHROID Take 25 mcg by mouth daily before breakfast.   ondansetron 4 MG tablet Commonly known as: ZOFRAN Take 4 mg by mouth every 6 (six) hours as needed for nausea or vomiting.   oxyCODONE 5 MG immediate release tablet Commonly known as: Oxy IR/ROXICODONE Take 1 tablet (5 mg total) by mouth every 6 (six) hours as needed for moderate pain (pain score 4-6).  0900/1300/1700/2100   pantoprazole 40 MG tablet Commonly known as: PROTONIX Take 1 tablet (40 mg total) by mouth daily. Start taking on: August 29, 2023   polyethylene glycol 17 g packet Commonly known as: MIRALAX / GLYCOLAX Take 17 g by mouth daily as needed.   PROBIOTIC (LACTOBACILLUS) PO Take 1 capsule by mouth daily. 08/13/23-08/25/23   tamsulosin 0.4 MG Caps capsule Commonly known as: FLOMAX Take 1 capsule (0.4 mg total) by mouth daily. Start taking on: August 29, 2023               Discharge Care Instructions  (From admission, onward)           Start     Ordered   08/28/23 0000  Discharge wound care:  Comments: Cleanse wounds R posterior leg and medial and lateral stump with Vashe wound cleanser Hart Rochester [phone]), apply Medihoney to wound beds daily, cover with dry gauze. Cover with ABD pad and secure with Kerlix roll gauze.   08/28/23 1032           No Known Allergies  Follow-up Information     [name], MD Follow up.   Specialty: Family Medicine Why: Hospital follow up Contact information: [address] [place] [address] [phone]         Georgiana Spinner, NP Follow up in 6 week(s).   Specialty: Vascular Surgery Contact information: [address] [place] [address] [phone]         [name], MD Follow up.   Specialty: Urology Why: call to schedule an appointment in approximately 2 weeks Contact information: [address] [place] [address] [phone]                  The results of significant diagnostics from this hospitalization (including imaging, microbiology, ancillary and laboratory) are listed below for reference.    Significant Diagnostic Studies: US RENAL Result Date: 08/19/2023 CLINICAL DATA:  Acute kidney injury. EXAM: RENAL / URINARY TRACT ULTRASOUND COMPLETE COMPARISON:  05/29/2023 FINDINGS: Right Kidney: Renal measurements: 10.2  x 3.7 x 5.4 cm = volume: 165 mL. Cortical atrophy and mildly increased cortical echogenicity. No hydronephrosis, focal mass or shadowing calculi. Left Kidney: Renal measurements: 11.2 x 4.9 x 4.9 cm = volume: 139 mL. Cortical atrophy and mildly increased cortical echogenicity. No hydronephrosis, focal mass or shadowing calculi. Bladder: Sonographer states that the patient refused further imaging after imaging of the kidneys and therefore the bladder was not imaged. Other: None. IMPRESSION: Bilateral renal cortical atrophy and mildly increased cortical echogenicity consistent with chronic kidney disease. No hydronephrosis. Electronically Signed   By: Irish Lack M.D.   On: 08/19/2023 16:55    Microbiology: Recent Results (from the past 240 hours)  Urine Culture (for pregnant, neutropenic or urologic patients or patients with an indwelling urinary catheter)     Status: Abnormal   Collection Time: 08/21/23  5:10 AM   Specimen: Urine, Clean Catch  Result Value Ref Range Status   Specimen Description   Final    URINE, CLEAN CATCH Performed at [facility], [address] [place], [place] [address]    Special Requests   Final    NONE Performed at [facility], [address] [place], [place] [address]    Culture (A)  Final    >=100,000 COLONIES/mL ESCHERICHIA COLI Confirmed Extended Spectrum Beta-Lactamase Producer (ESBL).  In bloodstream infections from ESBL organisms, carbapenems are preferred over piperacillin/tazobactam. They are shown to have a lower risk of mortality.    Report Status 08/23/2023 FINAL  Final   Organism ID, Bacteria ESCHERICHIA COLI (A)  Final      Susceptibility   Escherichia coli - MIC*    AMPICILLIN >=32 RESISTANT Resistant     CEFAZOLIN >=64 RESISTANT Resistant     CEFTRIAXONE >=64 RESISTANT Resistant     CIPROFLOXACIN >=4 RESISTANT Resistant     GENTAMICIN >=16 RESISTANT Resistant     IMIPENEM <=0.25 SENSITIVE Sensitive     NITROFURANTOIN  <=16 SENSITIVE Sensitive     TRIMETH/SULFA <=20 SENSITIVE Sensitive     AMPICILLIN/SULBACTAM >=32 RESISTANT Resistant     PIP/TAZO <=4 SENSITIVE Sensitive ug/mL    * >=100,000 COLONIES/mL ESCHERICHIA COLI  MRSA Next  Gen by PCR, Nasal     Status: Abnormal   Collection Time: 08/26/23  3:30 PM   Specimen: Nasal Mucosa; Nasal Swab  Result Value Ref Range Status   MRSA by PCR Next Gen DETECTED (A) NOT DETECTED Final    Comment: RESULT CALLED TO, READ BACK BY AND VERIFIED WITH: MELISSA DUNMAP AT 1828 ON 08/26/23 BY SS (NOTE) The GeneXpert MRSA Assay (FDA approved for NASAL specimens only), is one component of a comprehensive MRSA colonization surveillance program. It is not intended to diagnose MRSA infection nor to guide or monitor treatment for MRSA infections. Test performance is not FDA approved in patients less than 48 years old. Performed at [facility], [address] [place], [place] [address]      Labs: Basic Metabolic Panel: Recent Labs  Lab 08/22/23 0558 08/23/23 0306 08/24/23 0757 08/28/23 0537  NA 136  --  136 137  K 4.2  --  4.3 4.2  CL 100  --  107 106  CO2 26  --  24 24  GLUCOSE 173*  --  167* 73  BUN 62*  --  40* 47*  CREATININE 3.34*  --  1.99* 1.67*  CALCIUM 8.2*  --  8.3* 8.8*  MG 1.6* 1.6*  --   --    Liver Function Tests: No results for input(s): "AST", "ALT", "ALKPHOS", "BILITOT", "PROT", "ALBUMIN" in the last 168 hours. No results for input(s): "LIPASE", "AMYLASE" in the last 168 hours. No results for input(s): "AMMONIA" in the last 168 hours. CBC: Recent Labs  Lab 08/24/23 1019 08/28/23 0537  WBC 4.9 5.5  HGB 7.3* 7.6*  HCT 22.6* 23.3*  MCV 86.6 83.5  PLT 158 165   Cardiac Enzymes: No results for input(s): "CKTOTAL", "CKMB", "CKMBINDEX", "TROPONINI" in the last 168 hours. BNP: BNP (last 3 results) No results for input(s): "BNP" in the last 8760 hours.  ProBNP (last 3 results) No results for input(s): "PROBNP" in the last  8760 hours.  CBG: Recent Labs  Lab 08/27/23 1302 08/27/23 1746 08/27/23 2039 08/28/23 0759 08/28/23 0946  GLUCAP 98 132* 175* 53* 114*       Signed:  Silvano Bilis MD.  [facility] 08/28/2023, 10:32 AM

## 2023-08-28 NOTE — Progress Notes (Signed)
Patient is going to [facility] today. He will be going to room 502B  report called to [place] [phone]

## 2023-08-28 NOTE — Plan of Care (Signed)
  Problem: Education: Goal: Ability to describe self-care measures that may prevent or decrease complications (Diabetes Survival Skills Education) will improve Outcome: Progressing   Problem: Coping: Goal: Ability to adjust to condition or change in health will improve Outcome: Progressing   Problem: Fluid Volume: Goal: Ability to maintain a balanced intake and output will improve Outcome: Progressing   Problem: Nutritional: Goal: Maintenance of adequate nutrition will improve Outcome: Progressing   Problem: Skin Integrity: Goal: Risk for impaired skin integrity will decrease Outcome: Progressing   Problem: Pain Managment: Goal: General experience of comfort will improve and/or be controlled Outcome: Progressing   Problem: Safety: Goal: Ability to remain free from injury will improve Outcome: Progressing   Problem: Skin Integrity: Goal: Risk for impaired skin integrity will decrease Outcome: Progressing

## 2023-08-28 NOTE — Inpatient Diabetes Management (Signed)
Inpatient Diabetes Program Recommendations  AACE/ADA: New Consensus Statement on Inpatient Glycemic Control   Target Ranges:  Prepandial:   less than 140 mg/dL      Peak postprandial:   less than 180 mg/dL (1-2 hours)      Critically ill patients:  140 - 180 mg/dL    Latest Reference Range & Units 08/27/23 08:14 08/27/23 13:02 08/27/23 17:46 08/27/23 20:39 08/28/23 07:59  Glucose-Capillary 70 - 99 mg/dL 161 (H) 98 096 (H) 045 (H) 53 (L)    Review of Glycemic Control  Diabetes history: DM2 Outpatient Diabetes medications: Lantus 30 units daily Current orders for Inpatient glycemic control: Semglee 24 units at bedtime, Novolog 0-9 units TID with meals  Inpatient Diabetes Program Recommendations:    Insulin: CBG 53 mg/dl this morning. Please consider decreasing Semglee to 20 units at bedtime.  Thanks, Orlando Penner, RN, MSN, CDCES Diabetes Coordinator Inpatient Diabetes Program [phone] (Team Pager from 8am to 5pm)

## 2023-08-29 ENCOUNTER — Ambulatory Visit (INDEPENDENT_AMBULATORY_CARE_PROVIDER_SITE_OTHER): Payer: Medicare PPO | Admitting: Nurse Practitioner

## 2023-09-25 ENCOUNTER — Ambulatory Visit: Payer: Medicare PPO | Admitting: Urology

## 2023-09-25 ENCOUNTER — Ambulatory Visit: Payer: Medicare PPO | Admitting: Physician Assistant

## 2023-09-25 VITALS — BP 136/78 | HR 76

## 2023-09-25 DIAGNOSIS — Z8546 Personal history of malignant neoplasm of prostate: Secondary | ICD-10-CM

## 2023-09-25 DIAGNOSIS — R339 Retention of urine, unspecified: Secondary | ICD-10-CM | POA: Diagnosis not present

## 2023-09-25 LAB — BLADDER SCAN AMB NON-IMAGING: Scan Result: 245
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
## 2023-09-25 NOTE — Progress Notes (Signed)
 Afternoon follow-up  Patient returned to clinic this afternoon for PVR. He is accompanied by his HCPOAs, Fayrene Fearing and Fredia Sorrow. He has not urinated. Bladder scan on arrival ; he subsequently voided .  Results for orders placed or performed in visit on 09/25/23  BLADDER SCAN AMB NON-IMAGING   Collection Time: 09/25/23  3:54 PM  Result Value Ref Range   Scan Result 245 ml    Voiding trial passed. Will plan for repeat PVR in 4 weeks. I provided written documentation for Lewayne Bunting to replace a 16Fr Foley catheter if unable to void and having pain or abdominal distention.  Follow up: Return in about 4 weeks (around 10/23/2023) for Repeat PVR.

## 2023-09-25 NOTE — Progress Notes (Unsigned)
 Fill and Pull Catheter Removal  Patient is present today for a catheter removal.  Patient was cleaned and prepped in a sterile fashion 200 ml of sterile water/ saline was instilled into the bladder when the patient felt the urge to urinate. 4 ml of water was then drained from the balloon.  A 14FR foley cath was removed from the bladder no complications were noted .  Patient as then given some time to void on their own.  Patient can void  110 ml on their own after some time.  Patient tolerated well.  Performed by: Jearld Pies and Oki G  Follow up/ Additional notes: come back in the pm

## 2023-10-06 ENCOUNTER — Ambulatory Visit (INDEPENDENT_AMBULATORY_CARE_PROVIDER_SITE_OTHER): Payer: Medicare PPO | Admitting: Nurse Practitioner

## 2023-10-23 ENCOUNTER — Ambulatory Visit (INDEPENDENT_AMBULATORY_CARE_PROVIDER_SITE_OTHER): Payer: Medicare PPO | Admitting: Physician Assistant

## 2023-10-23 VITALS — BP 167/73 | HR 56

## 2023-10-23 DIAGNOSIS — R339 Retention of urine, unspecified: Secondary | ICD-10-CM | POA: Diagnosis not present

## 2023-10-23 DIAGNOSIS — Z8546 Personal history of malignant neoplasm of prostate: Secondary | ICD-10-CM | POA: Diagnosis not present

## 2023-10-23 LAB — BLADDER SCAN AMB NON-IMAGING: Scan Result: 14
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
## 2023-11-07 ENCOUNTER — Ambulatory Visit (INDEPENDENT_AMBULATORY_CARE_PROVIDER_SITE_OTHER): Admitting: Nurse Practitioner

## 2023-12-01 ENCOUNTER — Ambulatory Visit (INDEPENDENT_AMBULATORY_CARE_PROVIDER_SITE_OTHER): Admitting: Nurse Practitioner

## 2023-12-01 VITALS — BP 119/42 | HR 54 | Resp 17

## 2023-12-01 DIAGNOSIS — Z89511 Acquired absence of right leg below knee: Secondary | ICD-10-CM | POA: Diagnosis not present

## 2023-12-01 DIAGNOSIS — E1152 Type 2 diabetes mellitus with diabetic peripheral angiopathy with gangrene: Secondary | ICD-10-CM | POA: Diagnosis not present

## 2023-12-01 DIAGNOSIS — I10 Essential (primary) hypertension: Secondary | ICD-10-CM

## 2023-12-01 DIAGNOSIS — Z794 Long term (current) use of insulin: Secondary | ICD-10-CM | POA: Diagnosis not present

## 2023-12-02 ENCOUNTER — Encounter (INDEPENDENT_AMBULATORY_CARE_PROVIDER_SITE_OTHER): Payer: Self-pay | Admitting: Nurse Practitioner
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
## 2023-12-23 DIAGNOSIS — E875 Hyperkalemia: Secondary | ICD-10-CM

## 2023-12-23 DIAGNOSIS — N390 Urinary tract infection, site not specified: Secondary | ICD-10-CM

## 2024-02-27 DEATH — deceased

## 2024-06-02 ENCOUNTER — Ambulatory Visit (INDEPENDENT_AMBULATORY_CARE_PROVIDER_SITE_OTHER): Admitting: Nurse Practitioner

## 2024-10-19 ENCOUNTER — Ambulatory Visit: Admitting: Physician Assistant
